# Patient Record
Sex: Female | Born: 1980 | Race: White | Hispanic: No | Marital: Married | Smoking: Former smoker
Health system: Southern US, Community
[De-identification: ages and names within clinical notes are randomized; demographics above are authoritative.]

## PROBLEM LIST (undated history)

## (undated) DIAGNOSIS — L0203 Carbuncle of face: Secondary | ICD-10-CM

## (undated) DIAGNOSIS — J342 Deviated nasal septum: Secondary | ICD-10-CM

## (undated) DIAGNOSIS — J3489 Other specified disorders of nose and nasal sinuses: Secondary | ICD-10-CM

## (undated) DIAGNOSIS — K219 Gastro-esophageal reflux disease without esophagitis: Secondary | ICD-10-CM

## (undated) DIAGNOSIS — J343 Hypertrophy of nasal turbinates: Secondary | ICD-10-CM

## (undated) DIAGNOSIS — F419 Anxiety disorder, unspecified: Secondary | ICD-10-CM

## (undated) HISTORY — PX: WISDOM TOOTH EXTRACTION: SHX21

## (undated) HISTORY — DX: Gastro-esophageal reflux disease without esophagitis: K21.9

---
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
## 2002-03-25 ENCOUNTER — Encounter: Payer: Self-pay | Admitting: Emergency Medicine

## 2002-03-25 ENCOUNTER — Emergency Department (HOSPITAL_COMMUNITY): Admission: EM | Admit: 2002-03-25 | Discharge: 2002-03-25 | Payer: Self-pay | Admitting: Emergency Medicine

## 2005-09-18 ENCOUNTER — Emergency Department (HOSPITAL_COMMUNITY): Admission: EM | Admit: 2005-09-18 | Discharge: 2005-09-18 | Payer: Self-pay | Admitting: Emergency Medicine

## 2005-09-20 ENCOUNTER — Emergency Department (HOSPITAL_COMMUNITY): Admission: EM | Admit: 2005-09-20 | Discharge: 2005-09-20 | Payer: Self-pay | Admitting: Emergency Medicine

## 2007-01-07 ENCOUNTER — Emergency Department (HOSPITAL_COMMUNITY): Admission: EM | Admit: 2007-01-07 | Discharge: 2007-01-07 | Payer: Self-pay | Admitting: Emergency Medicine

## 2007-02-23 ENCOUNTER — Emergency Department (HOSPITAL_COMMUNITY): Admission: EM | Admit: 2007-02-23 | Discharge: 2007-02-23 | Payer: Self-pay | Admitting: Emergency Medicine

## 2007-04-07 ENCOUNTER — Emergency Department (HOSPITAL_COMMUNITY): Admission: EM | Admit: 2007-04-07 | Discharge: 2007-04-07 | Payer: Self-pay | Admitting: Emergency Medicine

## 2007-09-02 ENCOUNTER — Emergency Department (HOSPITAL_COMMUNITY): Admission: EM | Admit: 2007-09-02 | Discharge: 2007-09-02 | Payer: Self-pay | Admitting: Emergency Medicine

## 2009-05-11 ENCOUNTER — Emergency Department (HOSPITAL_BASED_OUTPATIENT_CLINIC_OR_DEPARTMENT_OTHER): Admission: EM | Admit: 2009-05-11 | Discharge: 2009-05-11 | Payer: Self-pay | Admitting: Emergency Medicine

## 2010-01-22 ENCOUNTER — Emergency Department (HOSPITAL_COMMUNITY): Admission: EM | Admit: 2010-01-22 | Discharge: 2010-01-22 | Payer: Self-pay | Admitting: Emergency Medicine

## 2010-06-23 ENCOUNTER — Ambulatory Visit (HOSPITAL_BASED_OUTPATIENT_CLINIC_OR_DEPARTMENT_OTHER): Admission: RE | Admit: 2010-06-23 | Discharge: 2010-06-23 | Payer: Self-pay | Admitting: Plastic Surgery

## 2010-06-23 HISTORY — PX: TRIGGER FINGER RELEASE: SHX641

## 2010-06-23 HISTORY — PX: FINGER TENDON REPAIR: SHX1640

## 2010-09-18 ENCOUNTER — Emergency Department (HOSPITAL_COMMUNITY): Admission: EM | Admit: 2010-09-18 | Discharge: 2010-06-19 | Payer: Self-pay | Admitting: Emergency Medicine

## 2010-12-01 ENCOUNTER — Other Ambulatory Visit: Payer: Self-pay | Admitting: Family Medicine

## 2010-12-01 DIAGNOSIS — N61 Mastitis without abscess: Secondary | ICD-10-CM

## 2010-12-01 DIAGNOSIS — N644 Mastodynia: Secondary | ICD-10-CM

## 2010-12-02 ENCOUNTER — Ambulatory Visit
Admission: RE | Admit: 2010-12-02 | Discharge: 2010-12-02 | Disposition: A | Discharge status: Home / Self Care | Payer: Self-pay | Source: Ambulatory Visit | Attending: Family Medicine | Admitting: Family Medicine

## 2010-12-02 ENCOUNTER — Other Ambulatory Visit: Payer: Self-pay | Admitting: Family Medicine

## 2010-12-02 DIAGNOSIS — N644 Mastodynia: Secondary | ICD-10-CM

## 2010-12-02 DIAGNOSIS — N61 Mastitis without abscess: Secondary | ICD-10-CM

## 2010-12-25 LAB — BASIC METABOLIC PANEL
BUN: 9 mg/dL (ref 6–23)
CO2: 25 mEq/L (ref 19–32)
Calcium: 9.1 mg/dL (ref 8.4–10.5)
Creatinine, Ser: 0.49 mg/dL (ref 0.4–1.2)
Glucose, Bld: 95 mg/dL (ref 70–99)
Sodium: 141 mEq/L (ref 135–145)

## 2010-12-25 LAB — DIFFERENTIAL
Basophils Absolute: 0.1 10*3/uL (ref 0.0–0.1)
Eosinophils Relative: 2 % (ref 0–5)
Lymphocytes Relative: 24 % (ref 12–46)
Lymphs Abs: 1.7 10*3/uL (ref 0.7–4.0)
Monocytes Absolute: 0.5 10*3/uL (ref 0.1–1.0)
Neutro Abs: 4.7 10*3/uL (ref 1.7–7.7)

## 2010-12-25 LAB — POCT HEMOGLOBIN-HEMACUE: Hemoglobin: 13.7 g/dL (ref 12.0–15.0)

## 2010-12-25 LAB — CBC
HCT: 37 % (ref 36.0–46.0)
MCV: 96.6 fL (ref 78.0–100.0)
Platelets: 176 10*3/uL (ref 150–400)
RDW: 13.2 % (ref 11.5–15.5)

## 2010-12-31 LAB — COMPREHENSIVE METABOLIC PANEL
ALT: 13 U/L (ref 0–35)
Albumin: 3.9 g/dL (ref 3.5–5.2)
Alkaline Phosphatase: 45 U/L (ref 39–117)
BUN: 10 mg/dL (ref 6–23)
Chloride: 108 mEq/L (ref 96–112)
Potassium: 3.7 mEq/L (ref 3.5–5.1)
Sodium: 140 mEq/L (ref 135–145)
Total Bilirubin: 0.5 mg/dL (ref 0.3–1.2)
Total Protein: 6.4 g/dL (ref 6.0–8.3)

## 2011-01-18 LAB — DIFFERENTIAL
Basophils Absolute: 0 10*3/uL (ref 0.0–0.1)
Basophils Relative: 0 % (ref 0–1)
Eosinophils Absolute: 0.1 10*3/uL (ref 0.0–0.7)
Monocytes Relative: 5 % (ref 3–12)
Neutro Abs: 8.1 10*3/uL — ABNORMAL HIGH (ref 1.7–7.7)
Neutrophils Relative %: 78 % — ABNORMAL HIGH (ref 43–77)

## 2011-01-18 LAB — BASIC METABOLIC PANEL
BUN: 14 mg/dL (ref 6–23)
CO2: 27 mEq/L (ref 19–32)
Calcium: 9.5 mg/dL (ref 8.4–10.5)
Chloride: 106 mEq/L (ref 96–112)
Creatinine, Ser: 1 mg/dL (ref 0.4–1.2)
Glucose, Bld: 84 mg/dL (ref 70–99)

## 2011-01-18 LAB — CBC
MCHC: 35.3 g/dL (ref 30.0–36.0)
MCV: 98.1 fL (ref 78.0–100.0)
Platelets: 177 10*3/uL (ref 150–400)
RDW: 13.1 % (ref 11.5–15.5)

## 2011-05-12 ENCOUNTER — Emergency Department (HOSPITAL_COMMUNITY)
Admission: EM | Admit: 2011-05-12 | Discharge: 2011-05-12 | Disposition: A | Discharge status: Home / Self Care | Payer: Self-pay | Attending: Emergency Medicine | Admitting: Emergency Medicine

## 2011-05-12 ENCOUNTER — Emergency Department (HOSPITAL_COMMUNITY): Payer: Self-pay

## 2011-05-12 DIAGNOSIS — R112 Nausea with vomiting, unspecified: Secondary | ICD-10-CM | POA: Insufficient documentation

## 2011-05-12 DIAGNOSIS — K2990 Gastroduodenitis, unspecified, without bleeding: Secondary | ICD-10-CM | POA: Insufficient documentation

## 2011-05-12 DIAGNOSIS — K2970 Gastritis, unspecified, without bleeding: Secondary | ICD-10-CM | POA: Insufficient documentation

## 2011-05-12 DIAGNOSIS — F17200 Nicotine dependence, unspecified, uncomplicated: Secondary | ICD-10-CM | POA: Insufficient documentation

## 2011-05-12 DIAGNOSIS — K922 Gastrointestinal hemorrhage, unspecified: Secondary | ICD-10-CM | POA: Insufficient documentation

## 2011-05-12 DIAGNOSIS — R109 Unspecified abdominal pain: Secondary | ICD-10-CM | POA: Insufficient documentation

## 2011-05-12 LAB — COMPREHENSIVE METABOLIC PANEL
Alkaline Phosphatase: 80 U/L (ref 39–117)
BUN: 13 mg/dL (ref 6–23)
Chloride: 104 mEq/L (ref 96–112)
Creatinine, Ser: 0.57 mg/dL (ref 0.50–1.10)
GFR calc Af Amer: >60 mL/min (ref >60)
Glucose, Bld: 98 mg/dL (ref 70–99)
Potassium: 4.2 mEq/L (ref 3.5–5.1)
Total Bilirubin: 0.3 mg/dL (ref 0.3–1.2)
Total Protein: 6.8 g/dL (ref 6.0–8.3)

## 2011-05-12 LAB — CBC
Hemoglobin: 13.3 g/dL (ref 12.0–15.0)
MCH: 32 pg (ref 26.0–34.0)
MCHC: 33.8 g/dL (ref 30.0–36.0)
RDW: 13.6 % (ref 11.5–15.5)

## 2011-05-12 LAB — PROTIME-INR: Prothrombin Time: 12.7 seconds (ref 11.6–15.2)

## 2011-05-12 LAB — SAMPLE TO BLOOD BANK

## 2011-06-08 ENCOUNTER — Ambulatory Visit (INDEPENDENT_AMBULATORY_CARE_PROVIDER_SITE_OTHER): Payer: Self-pay | Admitting: General Surgery

## 2011-06-08 ENCOUNTER — Encounter (INDEPENDENT_AMBULATORY_CARE_PROVIDER_SITE_OTHER): Payer: Self-pay | Admitting: General Surgery

## 2011-06-08 VITALS — BP 106/78 | HR 60 | Temp 99.0°F

## 2011-06-08 DIAGNOSIS — N61 Mastitis without abscess: Secondary | ICD-10-CM

## 2011-06-08 DIAGNOSIS — N611 Abscess of the breast and nipple: Secondary | ICD-10-CM

## 2011-06-08 NOTE — Patient Instructions (Signed)
You are scheduled for incision and drainage of your recurrent left breast breast abscess in the operating room tomorrow. After this infection clears up you may need further surgery for excision of the involved duct.

## 2011-06-08 NOTE — Progress Notes (Signed)
Chief Complaint  Patient presents with  . Other    reck left breast-painful and red     HPI Donna Mcdowell is a 30 y.o. female.    This is a 30 year old Caucasian female who has had left breast abscesses on 3 separate occasions in the past. She was last seen about 6 months ago at which time all of the infection and drainage had resolved. 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  She now presents with 8-10 day history of pain redness and swelling of the left breast. There's been no drainage on the scan or through the nipple. She's been on clindamycin for one week and this has not helped. HPI  Past Medical History  Diagnosis Date  . Breast pain in female     breast abscess  . Fatigue   . Wears glasses     wears contacts     Past Surgical History  Procedure Date  . Hand surgery     History reviewed. No pertinent family history.  Social History History  Substance Use Topics  . Smoking status: Current Everyday Smoker -- 1.0 packs/day  . Smokeless tobacco: Not on file  . Alcohol Use: Yes    No Known Allergies  No current outpatient prescriptions on file.    Review of Systems ROS 12 system review of systems is performed and is negative except as described above. Blood pressure 106/78, pulse 60, temperature 99 F (37.2 C).  Physical Exam Physical Exam  Constitutional: She appears well-developed and well-nourished. No distress.  HENT:  Head: Normocephalic and atraumatic.  Neck: No tracheal deviation present. No thyromegaly present.  Cardiovascular: Normal rate, normal heart sounds and intact distal pulses.   No murmur heard. Respiratory: Effort normal and breath sounds normal. No respiratory distress. She has no wheezes. She has no rales. She exhibits no tenderness.       Data Reviewed None other than the old chart  Assessment    Recurrent left breast abscess. I suspect that this is some  form of subareolar sepsis from the duct ectasia and perioductal mastitis complex.    Plan    In the short term, she will be taken to the operating room tomorrow for incision and drainage of the abscesses.  Long-term, once the inflammation settles down, she may need a formal duct excision to resolve these recurrent abscesses.       Opie Fanton M 06/08/2011, 4:26 PM

## 2011-06-09 ENCOUNTER — Ambulatory Visit (HOSPITAL_BASED_OUTPATIENT_CLINIC_OR_DEPARTMENT_OTHER)
Admission: RE | Admit: 2011-06-09 | Discharge: 2011-06-09 | Disposition: A | Discharge status: Home / Self Care | Payer: Self-pay | Source: Ambulatory Visit | Attending: General Surgery | Admitting: General Surgery

## 2011-06-09 DIAGNOSIS — F17200 Nicotine dependence, unspecified, uncomplicated: Secondary | ICD-10-CM | POA: Insufficient documentation

## 2011-06-09 DIAGNOSIS — N61 Mastitis without abscess: Secondary | ICD-10-CM

## 2011-06-09 DIAGNOSIS — K219 Gastro-esophageal reflux disease without esophagitis: Secondary | ICD-10-CM | POA: Insufficient documentation

## 2011-06-09 DIAGNOSIS — Z01812 Encounter for preprocedural laboratory examination: Secondary | ICD-10-CM | POA: Insufficient documentation

## 2011-06-09 HISTORY — PX: INCISION AND DRAINAGE BREAST ABSCESS: SUR672

## 2011-06-09 LAB — GRAM STAIN

## 2011-06-10 ENCOUNTER — Telehealth (INDEPENDENT_AMBULATORY_CARE_PROVIDER_SITE_OTHER): Payer: Self-pay

## 2011-06-10 NOTE — Telephone Encounter (Signed)
LMOM for pt to call for po appt.

## 2011-06-13 LAB — CULTURE, ROUTINE-ABSCESS

## 2011-06-14 LAB — ANAEROBIC CULTURE

## 2011-06-14 NOTE — Op Note (Signed)
NAMEJULIETA, Donna Mcdowell             ACCOUNT NO.:  [id number]  MEDICAL RECORD NO.:  [id number]  LOCATION:                                 FACILITY:  PHYSICIAN:  Angelia Mould. Derrell Lolling, M.D.DATE OF BIRTH:  [birth year]  DATE OF PROCEDURE:  06/09/2011 DATE OF DISCHARGE:                              OPERATIVE REPORT   PREOPERATIVE DIAGNOSIS:  Recurrent left breast abscess.  POSTOPERATIVE DIAGNOSIS:  Recurrent left breast abscess.  OPERATIONS PERFORMED: 1. Focused ultrasound left breast for guided-needle aspiration of left     breast abscess. 2. Incision and drainage of left breast abscess.  SURGEON:  Angelia Mould. Derrell Lolling, MD  OPERATIVE INDICATIONS:  This is a 30 year old Caucasian female who was evaluated in the office yesterday with a left breast abscess, which appeared to be behind the superior rim of the areolar margin.  There was no skin necrosis, no active drainage, and no nipple discharge.  She has a history of two or three other abscesses in the past.  She has an old incision at the 12 o'clock position in the circumareolar location.  She has an old incision at the 6 o'clock position and in the circumareolar orientation.  She also has scars medial and lateral to the nipple where she has had piercings in the past.  She has not had any jewelry or piercing recently.  She has never developed a fistula or any chronic drainage.  She is brought to the operating room for ultrasound mapping of the abscess and drainage under general anesthesia.  It should be noted that she has been on clindamycin for 1 week as an outpatient and this has not improved the infection.  OPERATIVE FINDINGS:  She had a ballotable fluctuant abscess behind the areolar margin at the 12 o'clock position.  There was erythema both superiorly and medially.  There was no drainage from the nipple.  The ultrasound showed a small abscess consistent with physical findings at the 12 o'clock position extending under the areolar  margin and extending out a little bit.  There was otherwise just edema in the tissues and no mass effect.  OPERATIVE TECHNIQUE:  Following the induction of general endotracheal anesthesia, the patient's left breast was prepped and draped in a sterile fashion.  Surgical time-out was held identifying correct patient and correct procedure and correct site.  We brought the ultrasound onto the operative field.  I mapped out the abscess and going circumferentially around the periareolar area and we could see the fluid-filled density extending from about the 11 o'clock to 1 o'clock position.  I then took an 18-gauge needle and syringe and aspirated some tan light creamy pus through the areolar margin.  This was sent for aerobic and anaerobic cultures and a stat Gram stain.  I then made an incision at the superior areolar margin going through the old scar.  I drained the abscess.  I digitally explored this with my finger and made sure that it was completely evacuated.  The wound was irrigated with saline.  Hemostasis was good and achieved with electrocautery.  I placed a one-quarter inch Penrose drain down in the wound and then placed a single suture of 4-0 nylon and the skin  to reapproximate the margins of the areola in the middle of this and so the drain was coming out both medial and lateral to the suture.  Clean absorbent bandages were placed and the patient was taken to the recovery room in stable condition.  Estimated blood loss was about 10 mL.  Complications none.  Sponge, needles and instrument counts were correct.     Angelia Mould. Derrell Lolling, M.D.     HMI/MEDQ  D:  06/09/2011  T:  06/09/2011  Job:  [id number]  Electronically Signed by Claud Kelp M.D. on 06/14/2011 11:33:18 AM

## 2011-06-18 ENCOUNTER — Ambulatory Visit (INDEPENDENT_AMBULATORY_CARE_PROVIDER_SITE_OTHER): Payer: Self-pay | Admitting: General Surgery

## 2011-06-18 ENCOUNTER — Encounter (INDEPENDENT_AMBULATORY_CARE_PROVIDER_SITE_OTHER): Payer: Self-pay | Admitting: General Surgery

## 2011-06-18 VITALS — BP 102/72 | HR 70 | Temp 98.7°F

## 2011-06-18 DIAGNOSIS — Z9889 Other specified postprocedural states: Secondary | ICD-10-CM

## 2011-06-18 NOTE — Progress Notes (Signed)
Subjective:     Patient ID: Donna Mcdowell, female   DOB: 02-06-1981, 30 y.o.   MRN: [id number]  HPI This patient has developed recurrent periareolar infections. She recently underwent incision and drainage of a periareolar abscess at the 12:00 position. She says that she is feeling much better with minimal pain. 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 She is taking Augmentin and is not having any side effects from that. The culture report shows polymicrobial bacteria including gram-positive cocci in pairs, a few gram-negative rods, and rare gram-positive rods. There were no anaerobes.  Review of Systems     Objective:   Physical Exam The left breast is examined. A small open wound superiorly has a little bit of turbid drainage but all the cellulitis and tenderness have resolved. The Penrose drain fell out at home. There is no residual drain in the wound. The single suture is removed. Redressed with dry gauze.    Assessment:     Peri-areolar abscess left breast, recurrent, recovering uneventfully following incision and drainage.  I am not entirely sure of the etiology of this. She has never developed a chronic fistula suggesting chronic cystic mastitis. This may be related to trauma from her nipple piercings in the past, although she has not done that recently.    Plan:     Continue antibiotics until they are gone.  Shower twice daily and cover with dry gauze. Ointments are discouraged.  Return to see me in 2 months.

## 2011-06-18 NOTE — Patient Instructions (Signed)
The infection in your left breast appears to be resolving nicely. Continue the antibiotics until they are completely gone. Shower and cover the wound with dry gauze bandage twice a day. Did not use any ointments. Return to see me in 2 months so I can be sure that everything healed properly.

## 2011-10-07 ENCOUNTER — Emergency Department (HOSPITAL_COMMUNITY)
Admission: EM | Admit: 2011-10-07 | Discharge: 2011-10-07 | Disposition: A | Discharge status: Home / Self Care | Payer: Self-pay | Attending: Emergency Medicine | Admitting: Emergency Medicine

## 2011-10-07 ENCOUNTER — Encounter (HOSPITAL_COMMUNITY): Payer: Self-pay | Admitting: Emergency Medicine

## 2011-10-07 DIAGNOSIS — L0201 Cutaneous abscess of face: Secondary | ICD-10-CM | POA: Insufficient documentation

## 2011-10-07 DIAGNOSIS — L03211 Cellulitis of face: Secondary | ICD-10-CM | POA: Insufficient documentation

## 2011-10-07 DIAGNOSIS — F17200 Nicotine dependence, unspecified, uncomplicated: Secondary | ICD-10-CM | POA: Insufficient documentation

## 2011-10-07 DIAGNOSIS — L0291 Cutaneous abscess, unspecified: Secondary | ICD-10-CM

## 2011-10-07 MED ORDER — DOXYCYCLINE HYCLATE 100 MG PO CAPS: 100.0000 mg | ORAL_CAPSULE | Freq: Two times a day (BID) | ORAL | Status: AC

## 2011-10-07 MED ORDER — LIDOCAINE HCL 1 % IJ SOLN
2.0000 mL | Freq: Once | INTRAMUSCULAR | Status: AC
  Administered 2011-10-07: 10 mL

## 2011-10-07 MED ORDER — LIDOCAINE HCL 1 % IJ SOLN
INTRAMUSCULAR | Status: AC
  Administered 2011-10-07: 11:00:00
  Filled 2011-10-07: qty 20

## 2011-10-07 MED ORDER — HYDROCODONE-ACETAMINOPHEN 5-325 MG PO TABS
2.0000 | ORAL_TABLET | Freq: Once | ORAL | Status: DC
  Filled 2011-10-07: qty 2

## 2011-10-07 MED ORDER — LIDOCAINE-EPINEPHRINE (PF) 2 %-1:200000 IJ SOLN: 10.0000 mL | Freq: Once | INTRAMUSCULAR | Status: DC

## 2011-10-07 MED ORDER — HYDROCODONE-ACETAMINOPHEN 7.5-500 MG/15ML PO SOLN: 10.0000 mL | Freq: Once | ORAL | Status: DC

## 2011-10-07 NOTE — ED Provider Notes (Signed)
Medical screening examination/treatment/procedure(s) were performed by non-physician practitioner and as supervising physician I was immediately available for consultation/collaboration.  Kaynan Klonowski P Adam Sanjuan, MD 10/07/11 1609 

## 2011-10-07 NOTE — ED Provider Notes (Signed)
History     CSN: [id number]  Arrival date & time 10/07/11  4098   First MD Initiated Contact with Patient 10/07/11 [phone]      Chief Complaint  Patient presents with  . Facial Swelling    Red, swollen "pimple on l/side of face-in front of l/ear. Pain x 2 days    (Consider location/radiation/quality/duration/timing/severity/associated sxs/prior treatment) The history is provided by the patient.    Pt presents to the ED with complaints of abscess to left side of face. The abscess started 2 days ago. Patient has a history of abscess in which she needed surgery for, she therefore came in early this time. The patient has used warm compresses, had taken ibuprofen and tylenol for pain but it has not helped. Pt denies fevers, chills, vomiting, diarrhea, nausea.  Past Medical History  Diagnosis Date  . Breast pain in female     breast abscess  . Fatigue   . Wears glasses     wears contacts     Past Surgical History  Procedure Date  . Hand surgery     Family History  Problem Relation Age of Onset  . Cancer Mother     History  Substance Use Topics  . Smoking status: Current Everyday Smoker -- 1.0 packs/day  . Smokeless tobacco: Not on file  . Alcohol Use: Yes    OB History    Grav Para Term Preterm Abortions TAB SAB Ect Mult Living                  Review of Systems  All other systems reviewed and are negative.    Allergies  Oxycodone base  Home Medications   Current Outpatient Rx  Name Route Sig Dispense Refill  . ACETAMINOPHEN 500 MG PO TABS Oral Take 1,000 mg by mouth every 6 (six) hours as needed. pain     . GOODY HEADACHE PO Oral Take 1 packet by mouth every 6 (six) hours as needed. pain     . DOXYCYCLINE HYCLATE 100 MG PO CAPS Oral Take 1 capsule (100 mg total) by mouth 2 (two) times daily. 20 capsule 1    BP 115/71  Pulse 82  Temp(Src) 97.7 F (36.5 C) (Oral)  Resp 16  SpO2 98%  LMP 09/16/2011  Physical Exam  Constitutional: She appears  well-developed and well-nourished.  HENT:  Head: Normocephalic and atraumatic.    Eyes: Pupils are equal, round, and reactive to light.  Neck: Trachea normal, normal range of motion and full passive range of motion without pain. Neck supple.  Cardiovascular: Normal rate, regular rhythm and normal pulses.   Pulmonary/Chest: Effort normal and breath sounds normal. Chest wall is not dull to percussion. She exhibits no tenderness, no crepitus, no edema, no deformity and no retraction.  Abdominal: Soft. Normal appearance and bowel sounds are normal.  Musculoskeletal: Normal range of motion.  Neurological: She is alert. She has normal strength.  Skin: Skin is warm, dry and intact.  Psychiatric: Her speech is normal. Cognition and memory are normal.    ED Course  Procedures (including critical care time)  Labs Reviewed - No data to display No results found.   1. Abscess       MDM  INCISION AND DRAINAGE Performed by: Dorthula Matas Consent: Verbal consent obtained. Risks and benefits: risks, benefits and alternatives were discussed Type: abscess  Body area: left periauricular  Anesthesia: local infiltration  Local anesthetic: lidocaine 1% without epinephrine  Anesthetic total: 2 ml  Complexity:  complex Blunt dissection to break up loculations  Drainage: purulent  Drainage amount: copious    Packing material: to small to pack  Patient tolerance: Patient tolerated the procedure well with no immediate complications.           Dorthula Matas, PA 10/07/11 [phone]

## 2012-03-16 ENCOUNTER — Telehealth (INDEPENDENT_AMBULATORY_CARE_PROVIDER_SITE_OTHER): Payer: Self-pay | Admitting: General Surgery

## 2012-03-16 NOTE — Telephone Encounter (Signed)
Pt called in stating she noticed when she got of out the shower the incision site on her breast had a yellow/green drainage. Last seen by Dr. Derrell Lolling 06/18/11. Patient has history of I&D for breast abscess. Pt stated no pain and the area is not red, site about the size of the top of a pen. Due to her history the patient was added to urgent office 03/17/12.

## 2012-03-17 ENCOUNTER — Ambulatory Visit (INDEPENDENT_AMBULATORY_CARE_PROVIDER_SITE_OTHER): Payer: Self-pay | Admitting: Surgery

## 2012-03-17 ENCOUNTER — Encounter (INDEPENDENT_AMBULATORY_CARE_PROVIDER_SITE_OTHER): Payer: Self-pay | Admitting: Surgery

## 2012-03-17 VITALS — BP 116/74 | HR 70 | Temp 97.8°F | Resp 14 | Ht 64.0 in | Wt 129.0 lb

## 2012-03-17 DIAGNOSIS — N61 Mastitis without abscess: Secondary | ICD-10-CM

## 2012-03-17 NOTE — Progress Notes (Signed)
[facility]  Ovidio Kin, MD,  FACS [address]  [address] [place], [place]    [address] Phone:  [phone] FAX:  [phone]   Re:   Donna Mcdowell DOB:   10/03/1981 MRN:   [id number]  URGENT OFFICE  ASSESSMENT AND PLAN: 1.  Recurrent left breast drainage  Photo taken.  This draining sinus will probably have to be excised.  Referred for follow up with Dr. Derrell Lolling.  I did not charge her for today's visit.  2.  Smokes 1 ppd - she knows it is bad for her health.  HISTORY OF PRESENT ILLNESS: Chief Complaint  Patient presents with  . Post-op Problem    Discharge from incision site - breast   Donna Mcdowell is a 31 y.o. (DOB: [birth year])  white female who is a patient of No primary provider on file. and comes to me today for left breast drainage.  She patient sounds like she had an infection of the left breast at a nipple ring site.  Dr. Derrell Lolling tried to manage this in the office, but the infection/drainage recurred.  He then took her to the OR 06/09/2011 and did an I&D.  This healed and was doing well until last week when she noticed drainage from the incision above the left nipple.   She has had no redness or evidence of recurrent infection, just stuff draining out of the wound.  PHYSICAL EXAM: BP 116/74  Pulse 70  Temp(Src) 97.8 F (36.6 C) (Temporal)  Resp 14  Ht 5\' 4"  (1.626 m)  Wt 129 lb (58.514 kg)  BMI 22.14 kg/m2  HEENT:  Pupils equal.  Otherwise unremarkable. NECK:  Supple.  No thyroid mass. LYMPH NODES:  No cervical, supraclavicular, or axillary adenopathy. BREASTS -  RIGHT:  No palpable mass or nodule.  No nipple discharge.   LEFT:  Above the nipple there is a 2 cm incision and I can express white sebaceum from the middle of the incision.  There is no infection nor anything to drain acutely. UPPER EXTREMITIES:  Multiple tattoos.  DATA REVIEWED: Dr. Jacinto Halim old notes.  Ovidio Kin, MD, FACS Office:  [phone]

## 2012-03-22 ENCOUNTER — Telehealth (INDEPENDENT_AMBULATORY_CARE_PROVIDER_SITE_OTHER): Payer: Self-pay

## 2012-03-22 NOTE — Telephone Encounter (Signed)
LMOM for pt that appt 04-05-12 at 1:30.

## 2012-04-05 ENCOUNTER — Ambulatory Visit (INDEPENDENT_AMBULATORY_CARE_PROVIDER_SITE_OTHER): Payer: Self-pay | Admitting: General Surgery

## 2012-04-05 ENCOUNTER — Telehealth (INDEPENDENT_AMBULATORY_CARE_PROVIDER_SITE_OTHER): Payer: Self-pay | Admitting: General Surgery

## 2012-04-05 ENCOUNTER — Encounter (INDEPENDENT_AMBULATORY_CARE_PROVIDER_SITE_OTHER): Payer: Self-pay | Admitting: General Surgery

## 2012-04-05 DIAGNOSIS — N61 Mastitis without abscess: Secondary | ICD-10-CM

## 2012-04-05 NOTE — Progress Notes (Signed)
Patient ID: Donna Mcdowell, female   DOB: 1981/03/13, 31 y.o.   MRN: [id number]   Patient failed to show up for her followup visit for her breast problems. Our nursing staff is instructed to contact her to arrange followup, if desired.   Angelia Mould. Derrell Lolling, M.D., [facility] General and Minimally invasive Surgery Breast and Colorectal Surgery Office:   [phone] Pager:   [phone]

## 2012-04-05 NOTE — Patient Instructions (Signed)
This patient did not show up for her office visit today. She'll be contacted to arrange a followup visit.

## 2012-04-05 NOTE — Telephone Encounter (Signed)
Called patient to determine cause for missing her appointment with [place] today. Patient stated that she thought the appointment was next week. Patient advised that she is doing better and the area does not have any signs of infection, no redness or discharge. Information related to Dr. Derrell Lolling when call was placed on hold and he advised that she contact our office in 3 weeks to set up an appointment for follow up. Patient agreed.

## 2012-04-13 ENCOUNTER — Encounter (INDEPENDENT_AMBULATORY_CARE_PROVIDER_SITE_OTHER): Payer: Self-pay | Admitting: General Surgery

## 2012-12-07 ENCOUNTER — Ambulatory Visit (INDEPENDENT_AMBULATORY_CARE_PROVIDER_SITE_OTHER): Payer: BC Managed Care – PPO | Admitting: Family Medicine

## 2012-12-07 VITALS — BP 112/69 | HR 82 | Temp 97.9°F | Resp 16 | Ht 64.0 in | Wt 135.0 lb

## 2012-12-07 DIAGNOSIS — R05 Cough: Secondary | ICD-10-CM

## 2012-12-07 DIAGNOSIS — J029 Acute pharyngitis, unspecified: Secondary | ICD-10-CM

## 2012-12-07 LAB — POCT RAPID STREP A (OFFICE): Rapid Strep A Screen: NEGATIVE

## 2012-12-07 MED ORDER — AZITHROMYCIN 250 MG PO TABS: ORAL_TABLET | ORAL | Status: DC

## 2012-12-07 NOTE — Progress Notes (Signed)
[facility] [address] [place] [address] [phone]  Date:  12/07/2012   Name:  Donna Mcdowell   DOB:  December 16, 1980   MRN:  [id number]  PCP:  No primary provider on file.    Chief Complaint: Cough and Sore Throat   History of Present Illness:  Donna Mcdowell is a 32 y.o. very pleasant female patient who presents with the following:  Donna Mcdowell has been ill since yesterday.  She notes a ST- it was worse this morning but has now eased off some.  She also noted a mild cough and chest congestion. She feels as though she may have bronchitis No fever that she had noted, no aches or chills No GI symptoms.  She does have a stuffy nose but she has had some allergies No sick contacts.   She is a smoker LMP was just about one month ago- she states there is no chance of pregnancy     Patient Active Problem List  Diagnosis  . Breast infection, left    Past Medical History  Diagnosis Date  . Breast pain in female     breast abscess  . Fatigue   . Wears glasses     wears contacts     Past Surgical History  Procedure Laterality Date  . Hand surgery      History  Substance Use Topics  . Smoking status: Current Every Day Smoker -- 1.00 packs/day  . Smokeless tobacco: Not on file  . Alcohol Use: Yes    Family History  Problem Relation Age of Onset  . Cancer Mother     Allergies  Allergen Reactions  . Oxycodone Base Nausea And Vomiting    Medication list has been reviewed and updated.  Current Outpatient Prescriptions on File Prior to Visit  Medication Sig Dispense Refill  . acetaminophen (TYLENOL) 500 MG tablet Take 1,000 mg by mouth every 6 (six) hours as needed. pain       . Aspirin-Acetaminophen-Caffeine (GOODY HEADACHE PO) Take 1 packet by mouth every 6 (six) hours as needed. pain        No current facility-administered medications on file prior to visit.    Review of Systems:  As per HPI- otherwise negative.   Physical  Examination: Filed Vitals:   12/07/12 1254  BP: 112/69  Pulse: 82  Temp: 97.9 F (36.6 C)  Resp: 16   Filed Vitals:   12/07/12 1254  Height: 5\' 4"  (1.626 m)  Weight: 135 lb (61.236 kg)   Body mass index is 23.16 kg/(m^2). Ideal Body Weight: Weight in (lb) to have BMI = 25: 145.3  GEN: WDWN, NAD, Non-toxic, A & O x 3 HEENT: Atraumatic, Normocephalic. Neck supple. No masses, No LAD. Bilateral TM wnl, oropharynx normal.  PEERL,EOMI.   Ears and Nose: No external deformity. CV: RRR, No M/G/R. No JVD. No thrill. No extra heart sounds. PULM: CTA B, no wheezes, crackles, rhonchi. No retractions. No resp. distress. No accessory muscle use. ABD: S, NT, ND EXTR: No c/c/e NEURO Normal gait.  PSYCH: Normally interactive. Conversant. Not depressed or anxious appearing.  Calm demeanor.   Results for orders placed in visit on 12/07/12  POCT RAPID STREP A (OFFICE)      Result Value Range   Rapid Strep A Screen Negative  Negative     Assessment and Plan: Acute pharyngitis - Plan: POCT rapid strep A  Cough - Plan: azithromycin (ZITHROMAX) 250 MG tablet  Suspect that [facility]  has a viral illness.  She would like an antibiotic, but I encouraged her to hold her azithromycin for the next few days to see if she will improve on her own.  If she does not get better she may fill the azithromycin- let me know sooner if worse  Abbe Amsterdam, MD

## 2012-12-07 NOTE — Patient Instructions (Addendum)
You most likely have a viral infection- try rest, fluids, and OTC pain/ fever reducers as needed.  If you are not better in the next few days fill the antibiotic rx.  Let me know if you are not better in the next 4 or 5 days, sooner if you get worse

## 2012-12-16 ENCOUNTER — Emergency Department (HOSPITAL_COMMUNITY)
Admission: EM | Admit: 2012-12-16 | Discharge: 2012-12-16 | Disposition: A | Discharge status: Home / Self Care | Payer: BC Managed Care – PPO | Attending: Emergency Medicine | Admitting: Emergency Medicine

## 2012-12-16 ENCOUNTER — Emergency Department (HOSPITAL_COMMUNITY): Payer: BC Managed Care – PPO

## 2012-12-16 DIAGNOSIS — R110 Nausea: Secondary | ICD-10-CM | POA: Insufficient documentation

## 2012-12-16 DIAGNOSIS — Z8619 Personal history of other infectious and parasitic diseases: Secondary | ICD-10-CM | POA: Insufficient documentation

## 2012-12-16 DIAGNOSIS — F17200 Nicotine dependence, unspecified, uncomplicated: Secondary | ICD-10-CM | POA: Insufficient documentation

## 2012-12-16 DIAGNOSIS — R1013 Epigastric pain: Secondary | ICD-10-CM | POA: Insufficient documentation

## 2012-12-16 DIAGNOSIS — Z3202 Encounter for pregnancy test, result negative: Secondary | ICD-10-CM | POA: Insufficient documentation

## 2012-12-16 DIAGNOSIS — Z87898 Personal history of other specified conditions: Secondary | ICD-10-CM | POA: Insufficient documentation

## 2012-12-16 DIAGNOSIS — R109 Unspecified abdominal pain: Secondary | ICD-10-CM

## 2012-12-16 LAB — CBC WITH DIFFERENTIAL/PLATELET
Basophils Absolute: 0 10*3/uL (ref 0.0–0.1)
Basophils Relative: 0 % (ref 0–1)
Eosinophils Relative: 3 % (ref 0–5)
HCT: 38.2 % (ref 36.0–46.0)
MCHC: 34.6 g/dL (ref 30.0–36.0)
MCV: 93.4 fL (ref 78.0–100.0)
Monocytes Absolute: 0.4 10*3/uL (ref 0.1–1.0)
Neutro Abs: 5 10*3/uL (ref 1.7–7.7)
RDW: 12.4 % (ref 11.5–15.5)

## 2012-12-16 LAB — URINALYSIS, ROUTINE W REFLEX MICROSCOPIC
Glucose, UA: NEGATIVE mg/dL
Leukocytes, UA: NEGATIVE
pH: 6.5 (ref 5.0–8.0)

## 2012-12-16 LAB — PREGNANCY, URINE: Preg Test, Ur: NEGATIVE

## 2012-12-16 LAB — COMPREHENSIVE METABOLIC PANEL
AST: 15 U/L (ref 0–37)
Albumin: 4.2 g/dL (ref 3.5–5.2)
Calcium: 9.2 mg/dL (ref 8.4–10.5)
Creatinine, Ser: 0.6 mg/dL (ref 0.50–1.10)
Total Protein: 7 g/dL (ref 6.0–8.3)

## 2012-12-16 MED ORDER — IOHEXOL 300 MG/ML  SOLN
50.0000 mL | Freq: Once | INTRAMUSCULAR | Status: AC | PRN
  Administered 2012-12-16: 50 mL via ORAL

## 2012-12-16 MED ORDER — HYDROMORPHONE HCL PF 1 MG/ML IJ SOLN
1.0000 mg | Freq: Once | INTRAMUSCULAR | Status: AC
  Administered 2012-12-16: 1 mg via INTRAVENOUS
  Filled 2012-12-16: qty 1

## 2012-12-16 MED ORDER — ONDANSETRON HCL 4 MG/2ML IJ SOLN
4.0000 mg | Freq: Once | INTRAMUSCULAR | Status: AC
  Administered 2012-12-16: 4 mg via INTRAVENOUS
  Filled 2012-12-16: qty 2

## 2012-12-16 MED ORDER — IOHEXOL 300 MG/ML  SOLN
100.0000 mL | Freq: Once | INTRAMUSCULAR | Status: AC | PRN
  Administered 2012-12-16: 100 mL via INTRAVENOUS

## 2012-12-16 MED ORDER — SODIUM CHLORIDE 0.9 % IV BOLUS (SEPSIS)
1000.0000 mL | Freq: Once | INTRAVENOUS | Status: AC
  Administered 2012-12-16: 1000 mL via INTRAVENOUS

## 2012-12-16 NOTE — ED Notes (Signed)
Pt c/o abdominal pain around mid abdomin. Pt states entire abdominal area is tender to touch and worse with movement. Pt describes pain as sharp and constant. Pt denies n/v/d. Pt states pain started about one hour ago.

## 2012-12-16 NOTE — ED Provider Notes (Signed)
History     CSN: [id number]  Arrival date & time 12/16/12  1641   First MD Initiated Contact with Patient 12/16/12 1705      Chief Complaint  Patient presents with  . Abdominal Pain    (Consider location/radiation/quality/duration/timing/severity/associated sxs/prior treatment) HPI  32 year old female with epigastric pain began approximately one hour prior to admission. She describes as pressure radiating out bilaterally and throughout her abdomen. She has had nausea but no vomiting. This began proximally 45 minutes after eating lunch. She has no history of a similar event in the past. She was well last night and this morning. She has not had fever or chills. Pain has been present for one hour and is moderate to severe in nature. She has had some associated nausea. She had not taken any medication prior to evaluation here.  Past Medical History  Diagnosis Date  . Breast pain in female     breast abscess  . Fatigue   . Wears glasses     wears contacts     Past Surgical History  Procedure Laterality Date  . Hand surgery      Family History  Problem Relation Age of Onset  . Cancer Mother     History  Substance Use Topics  . Smoking status: Current Every Day Smoker -- 1.00 packs/day  . Smokeless tobacco: Not on file  . Alcohol Use: Yes    OB History   Grav Para Term Preterm Abortions TAB SAB Ect Mult Living                  Review of Systems  Genitourinary: Negative for vaginal bleeding, vaginal discharge, genital sores, menstrual problem and pelvic pain.  All other systems reviewed and are negative.    Allergies  Oxycodone base  Home Medications   Current Outpatient Rx  Name  Route  Sig  Dispense  Refill  . acetaminophen (TYLENOL) 500 MG tablet   Oral   Take 1,000 mg by mouth every 6 (six) hours as needed. pain          . ibuprofen (ADVIL,MOTRIN) 200 MG tablet   Oral   Take 200 mg by mouth every 6 (six) hours as needed for pain. Pain            BP 119/67  Pulse 84  Temp(Src) 97.9 F (36.6 C) (Oral)  Resp 16  SpO2 96%  LMP 11/18/2012  Physical Exam  Nursing note and vitals reviewed. Constitutional: She appears well-developed and well-nourished.  HENT:  Head: Normocephalic and atraumatic.  Eyes: Conjunctivae and EOM are normal. Pupils are equal, round, and reactive to light.  Neck: Normal range of motion. Neck supple.  Cardiovascular: Normal rate, regular rhythm, normal heart sounds and intact distal pulses.   Pulmonary/Chest: Effort normal and breath sounds normal.  Abdominal: Soft. Bowel sounds are normal. There is tenderness.  Diffuse abdominal tenderness more tender in epigastrium and left upper quadrant. Mild lower abdominal tenderness.  Musculoskeletal: Normal range of motion.  Neurological: She is alert.  Skin: Skin is warm and dry.  Psychiatric: She has a normal mood and affect. Thought content normal.    ED Course  Procedures (including critical care time)  Labs Reviewed  CBC WITH DIFFERENTIAL  COMPREHENSIVE METABOLIC PANEL  LIPASE, BLOOD  URINALYSIS, ROUTINE W REFLEX MICROSCOPIC  PREGNANCY, URINE   US Abdomen Complete  12/16/2012  *RADIOLOGY REPORT*  Clinical Data:  Mid abdominal pain  COMPLETE ABDOMINAL ULTRASOUND  Comparison:  None.  Findings:  Gallbladder:  No gallstones, gallbladder wall thickening, or pericholecystic fluid.  Negative sonographic Murphy's sign.  Common bile duct:  Measures 4 mm.  Liver:  No focal lesion identified.  Within normal limits in parenchymal echogenicity.  IVC:  Appears normal.  Pancreas:  No focal abnormality seen.  Spleen:  Measures 8.2 cm.  Right Kidney:  Measures 11.4 cm.  No mass or hydronephrosis.  Left Kidney:  Measures 10.6 cm.  No mass or hydronephrosis.  Abdominal aorta:  No aneurysm identified.  IMPRESSION: Negative abdominal ultrasound.   Original Report Authenticated By: Charline Bills, M.D.    Ct Abdomen Pelvis W Contrast  12/16/2012  *RADIOLOGY REPORT*   Clinical Data: Upper abdominal pain.  Symptoms began 1 hour ago.  CT ABDOMEN AND PELVIS WITH CONTRAST  Technique:  Multidetector CT imaging of the abdomen and pelvis was performed following the standard protocol during bolus administration of intravenous contrast.  Contrast: 50mL OMNIPAQUE IOHEXOL 300 MG/ML  SOLN, OMNIPAQUE IOHEXOL 300 MG/ML  SOLN  Comparison: Ultrasound earlier in the day.  CT of 01/07/2007  Findings: Lung bases:  Normal  Abdomen/pelvis:  Normal liver, spleen, stomach, pancreas, gallbladder, biliary tract, adrenal glands, kidneys.  No retroperitoneal or retrocrural adenopathy.  Normal colon, appendix, and terminal ileum.  Normal small bowel without abdominal ascites.  No pelvic adenopathy.  Normal urinary bladder and uterus.  A left ovarian lesion has peripheral enhancement and measures 1.7 cm on image 65/series 2. No significant free fluid.  Bones/Musculoskeletal:  No acute osseous abnormality.  IMPRESSION:  1.  Left ovarian corpus luteal cyst. 2.  Otherwise, normal abdominal pelvic CT, without explanation for pain.   Original Report Authenticated By: Jeronimo Greaves, M.D.      No diagnosis found.    MDM   Labs, abdominal ultrasound, CT abdomen pelvis are normal. Patient feels much better after IV fluids and 1 mg Dilaudid. She has taken by mouth without vomiting. I discussed the results of the above tests with her. I have given her precautions regarding abdominal pain.      Hilario Quarry, MD 12/16/12 [phone]

## 2013-01-03 ENCOUNTER — Encounter: Payer: Self-pay | Admitting: Gynecology

## 2013-01-03 ENCOUNTER — Ambulatory Visit (INDEPENDENT_AMBULATORY_CARE_PROVIDER_SITE_OTHER): Payer: BC Managed Care – PPO | Admitting: Gynecology

## 2013-01-03 VITALS — BP 110/74 | HR 62 | Ht 64.0 in | Wt 134.0 lb

## 2013-01-03 DIAGNOSIS — Z01419 Encounter for gynecological examination (general) (routine) without abnormal findings: Secondary | ICD-10-CM | POA: Insufficient documentation

## 2013-01-03 DIAGNOSIS — Z0000 Encounter for general adult medical examination without abnormal findings: Secondary | ICD-10-CM

## 2013-01-03 LAB — POCT URINALYSIS DIPSTICK
Blood, UA: NEGATIVE
Glucose, UA: NEGATIVE
Nitrite, UA: NEGATIVE
Protein, UA: NEGATIVE
Urobilinogen, UA: NEGATIVE

## 2013-01-03 NOTE — Patient Instructions (Signed)

## 2013-01-03 NOTE — Progress Notes (Signed)
32 y.o. Single Caucasian female   G0P0 here for annual exam. Pt reports being seen in ER for abdominal pain, evaluation normal felt to be related to recent passing of father.  Pt lesbian, current partner 3 1/2 years.  Denies dysparunia, denies vaginal penetration.    Patient's last menstrual period was 12/05/2012.          Sexually active: yes  The current method of family planning is none.    Exercising: no Last mammogram: left 2012  Last pap smear: unknown History of abnormal pap: no Smoking: yes 1ppd 10y Alcohol:yes wine or beer Last colonoscopy: no  Last Bone Density:  no Last tetanus shot:<5y Last cholesterol check: unknown BSE: yes  Hgb:                Urine:    Health Maintenance  Topic Date Due  . Influenza Vaccine  06/13/1983  . Pap Smear  12/19/2000  . Tetanus/tdap  12/19/2001    Family History  Problem Relation Age of Onset  . Breast cancer Mother 31  . Osteoarthritis Maternal Grandmother   . Osteoarthritis Maternal Grandfather   . Heart failure Maternal Grandfather     Patient Active Problem List  Diagnosis  . Breast infection, left    Past Medical History  Diagnosis Date  . Breast pain in female     breast abscess  . Fatigue   . Wears glasses     wears contacts     Past Surgical History  Procedure Laterality Date  . Hand surgery    . Breast surgery Left     breast abscess, history of peircing on that breast    Allergies: Oxycodone base  Current Outpatient Prescriptions  Medication Sig Dispense Refill  . acetaminophen (TYLENOL) 500 MG tablet Take 1,000 mg by mouth every 6 (six) hours as needed. pain       . ibuprofen (ADVIL,MOTRIN) 200 MG tablet Take 200 mg by mouth every 6 (six) hours as needed for pain. Pain       No current facility-administered medications for this visit.    ROS: Pertinent items are noted in HPI.  Exam:    BP 110/74  Pulse 62  Ht 5\' 4"  (1.626 m)  Wt 134 lb (60.782 kg)  BMI 22.99 kg/m2  LMP 12/05/2012   Wt  Readings from Last 3 Encounters:  01/03/13 134 lb (60.782 kg)  12/07/12 135 lb (61.236 kg)  03/17/12 129 lb (58.514 kg)     Ht Readings from Last 3 Encounters:  01/03/13 5\' 4"  (1.626 m)  12/07/12 5\' 4"  (1.626 m)  03/17/12 5\' 4"  (1.626 m)    General appearance: alert, cooperative and appears stated age Head: Normocephalic, without obvious abnormality, atraumatic Neck: no adenopathy, supple, symmetrical, trachea midline and thyroid not enlarged, symmetric, no tenderness/mass/nodules Lungs: clear to auscultation bilaterally Breasts: Inspection negative, No nipple retraction or dimpling, No nipple discharge or bleeding, No axillary or supraclavicular adenopathy, Normal to palpation without dominant masses, prior biopsy site noted Heart: regular rate and rhythm Abdomen: soft, non-tender; bowel sounds normal; no masses,  no organomegaly Extremities: extremities normal, atraumatic, no cyanosis or edema Skin: Skin color, texture, turgor normal. No rashes or lesions.  Multiple tattoos and piercing's Lymph nodes: Cervical, supraclavicular, and axillary nodes normal. No abnormal inguinal nodes palpated Neurologic: Grossly normal   Pelvic: External genitalia:  no lesions              Urethra:  normal appearing urethra with no masses, tenderness  or lesions              Bartholins and Skenes: normal                 Vagina: normal appearing vagina with normal color and discharge, no lesions              Cervix: normal appearance              Pap taken: yes        Bimanual Exam:  Uterus:  uterus is normal size, shape, consistency and nontender                                      Adnexa: normal adnexa in size, nontender and no masses                                      Rectovaginal: Confirms                                      Anus:  normal sphincter tone, no lesions  A: well woman     P: pap smear with HR HPV counseled on breast self exam return annually or prn   Discussed importance of  confirming clean new needles with piercings and tattoos Cycle delay probably related to recent stress  Declines STD screening, HCV testing  An After Visit Summary was printed and given to the patient.

## 2013-01-13 ENCOUNTER — Encounter: Payer: Self-pay | Admitting: Gynecology

## 2013-01-17 ENCOUNTER — Encounter: Payer: Self-pay | Admitting: *Deleted

## 2013-01-19 ENCOUNTER — Other Ambulatory Visit: Payer: Self-pay | Admitting: Gynecology

## 2013-01-19 ENCOUNTER — Telehealth: Payer: Self-pay | Admitting: Gynecology

## 2013-01-19 ENCOUNTER — Encounter: Payer: Self-pay | Admitting: Gynecology

## 2013-01-19 DIAGNOSIS — IMO0002 Reserved for concepts with insufficient information to code with codable children: Secondary | ICD-10-CM

## 2013-01-19 NOTE — Telephone Encounter (Signed)
Call to patient to schedule colpo.  Female partner so no contraception needed.  Menses due in new few days and lasts approx 5-7 days.  Colpo sched for 02-01-13.

## 2013-01-19 NOTE — Progress Notes (Signed)
This encounter was created in error - please disregard.

## 2013-01-19 NOTE — Telephone Encounter (Signed)
Discussed pt's PAP results-HGSIL, explained the effects of HPV infections, reviewed colposcopy procedure.  Recommend pt pretreat with motrin 600-800mg , to call after menses to schedule. All questions addressed

## 2013-02-01 ENCOUNTER — Ambulatory Visit (INDEPENDENT_AMBULATORY_CARE_PROVIDER_SITE_OTHER): Payer: BC Managed Care – PPO | Admitting: Gynecology

## 2013-02-01 VITALS — BP 94/64

## 2013-02-01 DIAGNOSIS — R87613 High grade squamous intraepithelial lesion on cytologic smear of cervix (HGSIL): Secondary | ICD-10-CM

## 2013-02-01 DIAGNOSIS — IMO0002 Reserved for concepts with insufficient information to code with codable children: Secondary | ICD-10-CM

## 2013-02-01 NOTE — Procedures (Signed)
32 y.o. Single Caucasian female   here for colposcopy. 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  Contraception:  no method   Blood pressure 94/64, last menstrual period 01/21/2013.  Procedure explained and patient's questions were invited and answered.  Consent form signed.    Role of HPV in genesis of SIL discussed with patient, and questions answered.   Speculum inserted atraumatically and cervix visualized.  3% acetic acid applied.  Cervix examined using 3.75, 7.5,15  X magnification and green filter.    Gross appearance:normal  Squamocolumnar junction seen in entirety: yes  Extent of lesion entirely seen: yes   acetowhite lesion(s) noted at 5,7 and 12 o'clock, abnormal vessels noted at 5 o'clock and polyp noted at 3 o'clockendocervical speculum placed, SCJ visualized - lesion at 5,7 and 12 o'clock, endocervical curettage performed, cervical biopsies taken at 5,7 and 12 o'clock, specimen labelled and sent to pathology and hemostasis achieved with Monsel's solution   See picture in progress note        Patient tolerated procedure well.   Plan:  Will base further treatment on Pathology findings. 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

## 2013-02-01 NOTE — Progress Notes (Signed)
Subjective:     Patient ID: Donna Mcdowell, female   DOB: [birth year], 32 y.o.   MRN: [id number]  HPI here for colposcopy for HGSIL, findings as noted below   Review of Systems     Objective:   Physical Exam  Genitourinary:         Assessment:     HGSIL- bx at 5,7,12 ECC     Plan:     Triage based on results

## 2013-02-06 ENCOUNTER — Telehealth: Payer: Self-pay | Admitting: *Deleted

## 2013-02-09 ENCOUNTER — Telehealth: Payer: Self-pay | Admitting: *Deleted

## 2013-02-09 NOTE — Telephone Encounter (Signed)
Message copied by Alisa Graff on Thu Feb 09, 2013  5:45 PM ------      Message from: Douglass Rivers      Created: Tue Feb 07, 2013 10:38 AM       Pt needs LEEP based on all bx CIN II, will probably need pre-procedure ativan, please ask pt to come with transportation ------

## 2013-02-09 NOTE — Telephone Encounter (Signed)
Patient notified of pathology report and Dr Lathrop's recommendation for LEEP.  Procedure explained and scheduled for 03-07-13 based on next menses and patient travel plans.  Menses due approx 02-24-13 and patient in same sex relationship so denies potential for pregnancy.  Advised to notify office if menses is significantly early or late.  Instructed to take Motrin 800 mg 1 hr prior with food. She did not ask about Ativan and did not seem upset.  Do you want me to advise her of this option?

## 2013-02-13 NOTE — Telephone Encounter (Signed)
No, but if needed will offer that day, I expect she will come with a ride

## 2013-03-07 ENCOUNTER — Ambulatory Visit: Payer: Self-pay | Admitting: Gynecology

## 2013-03-09 ENCOUNTER — Ambulatory Visit (INDEPENDENT_AMBULATORY_CARE_PROVIDER_SITE_OTHER): Payer: BC Managed Care – PPO | Admitting: Gynecology

## 2013-03-09 DIAGNOSIS — R87613 High grade squamous intraepithelial lesion on cytologic smear of cervix (HGSIL): Secondary | ICD-10-CM

## 2013-03-09 DIAGNOSIS — N871 Moderate cervical dysplasia: Secondary | ICD-10-CM | POA: Insufficient documentation

## 2013-03-09 DIAGNOSIS — IMO0002 Reserved for concepts with insufficient information to code with codable children: Secondary | ICD-10-CM

## 2013-03-09 NOTE — Patient Instructions (Signed)
Loop Electrosurgical Excision Procedure  Loop electrosurgical excision procedure (LEEP) is the removal of a portion of the lower part of the uterus (cervix). The procedure is done when there are significantly abnormal cervical cell changes. Abnormal cell changes of the cervix can lead to cancer if left in place and untreated.     The LEEP procedure itself typically only takes a few minutes. Often, it may be done in your caregiver's office. The procedure is considered safe for those who wish to get pregnant or are trying to get pregnant. Only under rare circumstances should this procedure be done if you are pregnant.  LET YOUR CAREGIVER KNOW ABOUT:  · Whether you are pregnant or late for your last menstrual period.  · Allergies to foods or medicines.  · All the medicines you are taking including herbs, eyedrops, and over-the-counter medicines, and creams.  · Use of steroids (by mouth or creams).  · Previous problems with anesthetics or numbing medicine.  · Previous gynecological surgery.  · History of blood clots or bleeding problems.  · Any recent or current vaginal infections (herpes, sexually transmitted infections).  · Other health problems.  RISKS AND COMPLICATIONS  · Bleeding.  · Infection.  · Injury to the vagina, bladder, or rectum.  · Very rare obstruction of the cervical opening that causes problems during menstruation (cervical stenosis).  BEFORE THE PROCEDURE  · Do not take aspirin or blood thinners (anticoagulants) for 1 week before the procedure, or as told by your caregiver.  · Eat a light meal before the procedure.  · Ask your caregiver about changing or stopping your regular medicines.  · You may be given a pain reliever 1 or 2 hours before the procedure.  PROCEDURE   · A tool (speculum) is placed in the vagina. This allows your caregiver to see the cervix.  · An iodine stain is applied to the cervix to find the area of abnormal cells to be removed.  · Medicine is injected to numb the cervix (local  anesthetic).    · Electricity is passed through a thin wire loop which is then used to remove (cauterize) a small segment of the affected cervix.  · Light electrocautery is used to seal any small blood vessels and prevent bleeding.  · A paste may be applied to the cauterized area of the cervix to help prevent bleeding.  · The tissue sample is sent to the lab. It is examined under the microscope.  AFTER THE PROCEDURE  · Have someone drive you home.  · You may have slight to moderate cramping.  · You may notice a black vaginal discharge from the paste used on the cervix to prevent bleeding. This is normal.  · Watch for excessive bleeding. This requires immediate medical care.  · Ask when your test results will be ready. Make sure you get your test results.  Document Released: 12/19/2002 Document Revised: 12/21/2011 Document Reviewed: 03/10/2011  [employer]® Patient Information ©2014 [employer].

## 2013-03-09 NOTE — Procedures (Addendum)
32 y.o. Single caucasion female here for LEEP.    Pap History:high-grade squamous intraepithelial neoplasia  (HGSIL-encompassing moderate and severe dysplasia) on Pap done January 05, 2013  Patient's last menstrual period was 02/21/2013. lesbian  Pre-procedure vitals: Last menstrual period 02/21/2013.   Procedure explained and patient's questions were invited and answered.   Consent form signed.  Pre-procedure medication:  Motrin 800mg   Procedure Set-up: Grounding pad located leftthigh.  Cautery settings:60 cut/65coagulation.  Suction applied to coated speculum.  Procedure:  Speculum placed with good visualization of the cervix.  Colposcopy performed showing:  acetowhite lesion(s) noted at 12, 5, 7 o'clock and mosaicism noted at 5 o'clock.  Cervix anesthetized using 2% Xylocaine with 1:100,000units Epinephrine with 0.25%marcaine.  10 cc's used.Entire transition zone with 20mm loop in 2passes. Endocervcial button obtained with 12mm Specimen(s) placed on cork and labeled for pathology.  Hemostasis obtained with ball cautery and Monsel's solution.  EBL:  Minimal  Complications:none  Patient tolerated procedure well and left the office in satisfactory condition.  Plan:  After visit summary given.  Repeat pap depending on pathology

## 2013-03-13 LAB — IPS CERVICAL/ECC/EMB/VULVAR/VAGINAL BIOPSY

## 2013-03-14 ENCOUNTER — Telehealth: Payer: Self-pay | Admitting: Gynecology

## 2013-03-14 NOTE — Telephone Encounter (Signed)
Patient had surgery recently and wants to know if she is okay to get in the ocean or pool?

## 2013-03-14 NOTE — Telephone Encounter (Signed)
See note below. Please advise.  

## 2013-03-15 ENCOUNTER — Telehealth: Payer: Self-pay | Admitting: Gynecology

## 2013-03-15 NOTE — Telephone Encounter (Signed)
Verbal order per Dr. Farrel Gobble of reason of not ok to get in the ocean or pool from having had surgery. Advised patient of not okay to get in the ocean or pool  Per Dr. Farrel Gobble due to soon after surgery and risk of bleeding and infection. Patient not happy with this response and stated would not have had surgery procedure done had she known this and hung up phone,

## 2013-03-15 NOTE — Telephone Encounter (Signed)
LM to call regarding pathology

## 2013-03-15 NOTE — Telephone Encounter (Signed)
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  Pt informed area treated with cautery afterwards. Stressed to pt importance of regular annual PAP's for 20y,  Stressed importance of pelvic rest at this time as she had wanted to go to the beach this weekend. All questions were addressed

## 2013-03-15 NOTE — Telephone Encounter (Signed)
Patient returning phone  Dr.Lathrop

## 2013-03-24 ENCOUNTER — Telehealth: Payer: Self-pay | Admitting: Gynecology

## 2013-03-24 NOTE — Telephone Encounter (Signed)
It's 2 weeks, should be ok

## 2013-03-24 NOTE — Telephone Encounter (Signed)
Patient notified of Dr. Farrel Gobble response. sue

## 2013-03-24 NOTE — Telephone Encounter (Signed)
Patient had procedure done LEEP on 03/09/2013 . Calling to day to see if OK to use a tampon. Has started her cycle this morning and did not want to go out and purchase unnecessary maxi pads if it is ok for her to use a Tampon. States she has a tampon in but if should not have will go purchase. Please advise.sue

## 2013-03-24 NOTE — Telephone Encounter (Signed)
Patient has had a procedure done and is wanting to know if she can use a tampon.

## 2013-05-25 ENCOUNTER — Telehealth: Payer: Self-pay | Admitting: *Deleted

## 2013-05-25 NOTE — Telephone Encounter (Signed)
Follow up call to patient, LMTCB. 

## 2013-06-15 ENCOUNTER — Ambulatory Visit (INDEPENDENT_AMBULATORY_CARE_PROVIDER_SITE_OTHER): Payer: BC Managed Care – PPO | Admitting: Family Medicine

## 2013-06-15 VITALS — BP 104/68 | HR 86 | Temp 98.0°F | Resp 20 | Ht 63.75 in | Wt 135.0 lb

## 2013-06-15 DIAGNOSIS — M545 Low back pain: Secondary | ICD-10-CM

## 2013-06-15 MED ORDER — CYCLOBENZAPRINE HCL 10 MG PO TABS: 10.0000 mg | ORAL_TABLET | Freq: Two times a day (BID) | ORAL | Status: DC | PRN

## 2013-06-15 MED ORDER — MELOXICAM 7.5 MG PO TABS: 7.5000 mg | ORAL_TABLET | Freq: Every day | ORAL | Status: DC

## 2013-06-15 NOTE — Patient Instructions (Addendum)
Use the medications as needed.  If you are not better in the next few days please let me know- Sooner if worse.

## 2013-06-15 NOTE — Progress Notes (Signed)
[facility] [address] [place] [address] [phone]  Date:  06/15/2013   Name:  Donna Mcdowell   DOB:  October 07, 1981   MRN:  [id number]  PCP:  Donna Alm, MD    Chief Complaint: Back Pain   History of Present Illness:  Donna Mcdowell is a 32 y.o. very pleasant female patient who presents with the following:  She is here today with LBP.  She has noted pain if she sits in a certain way for a long time. However, she has noted constant pain since she awoke last week.  It is worse when she is seated.  She feels ok laying flat.  The pain is located in the center of her low back.   No radiation down her legs.  Her legs feel tired when she stands.  No leg weakness or numbness.   There is no known injury.  She has not been lifting or exercising more.   LMP less than one month ago.  She is a smoker  Patient Active Problem List   Diagnosis Date Noted  . CIN II (cervical intraepithelial neoplasia II) 03/09/2013  . Routine gynecological examination 01/03/2013  . Breast infection, left 03/17/2012    Past Medical History  Diagnosis Date  . Breast pain in female     breast abscess  . Fatigue   . Wears glasses     wears contacts     Past Surgical History  Procedure Laterality Date  . Hand surgery    . Breast surgery Left     breast abscess, history of peircing on that breast    History  Substance Use Topics  . Smoking status: Current Every Day Smoker -- 1.00 packs/day    Types: Cigarettes  . Smokeless tobacco: Not on file  . Alcohol Use: Yes    Family History  Problem Relation Age of Onset  . Breast cancer Mother 61  . Cancer Mother   . Osteoarthritis Maternal Grandmother   . Cancer Maternal Grandmother   . Osteoarthritis Maternal Grandfather   . Heart failure Maternal Grandfather     Allergies  Allergen Reactions  . Oxycodone Base Nausea And Vomiting    Medication list has been reviewed and updated.  Current Outpatient  Prescriptions on File Prior to Visit  Medication Sig Dispense Refill  . acetaminophen (TYLENOL) 500 MG tablet Take 1,000 mg by mouth every 6 (six) hours as needed. pain       . ibuprofen (ADVIL,MOTRIN) 200 MG tablet Take 200 mg by mouth every 6 (six) hours as needed for pain. Pain       No current facility-administered medications on file prior to visit.    Review of Systems:  As per HPI- otherwise negative. No leg numbness or weakness, no bowel or bladder incontinence   Physical Examination: Filed Vitals:   06/15/13 1209  BP: 104/68  Pulse: 86  Temp: 98 F (36.7 C)  Resp: 20   Filed Vitals:   06/15/13 1209  Height: 5' 3.75" (1.619 m)  Weight: 135 lb (61.236 kg)   Body mass index is 23.36 kg/(m^2). Ideal Body Weight: Weight in (lb) to have BMI = 25: 144.2  GEN: WDWN, NAD, Non-toxic, A & O x 3, looks well HEENT: Atraumatic, Normocephalic. Neck supple. No masses, No LAD. Ears and Nose: No external deformity. CV: RRR, No M/G/R. No JVD. No thrill. No extra heart sounds. PULM: CTA B, no wheezes, crackles, rhonchi. No retractions. No resp.  distress. No accessory muscle use. EXTR: No c/c/e NEURO Normal gait.  PSYCH: Normally interactive. Conversant. Not depressed or anxious appearing.  Calm demeanor.  Back: mild tenderness over the lumbar area and upper gluteal cleft.  No evidence of abscess.  Negative SLR bilaterally.  Normal LE strength, sensation and DTR.    Assessment and Plan: Lumbar pain - Plan: meloxicam (MOBIC) 7.5 MG tablet, cyclobenzaprine (FLEXERIL) 10 MG tablet  Treat for a lumbar strain.  If not better she is to let me know Avoid other NSAIDs while on mobic, watch for sedation with flexeril  Signed Abbe Amsterdam, MD

## 2013-07-14 NOTE — Telephone Encounter (Signed)
Follow up call to patient.  Advised that during physician review (standard process in our office for pathology cases) physicians discussed that they would prefer to be more conservative in their follow up than guidelines recommend.  We recommend pap in 6 month instead of the year that was initially discussed.  Patient agreeable and pap scheduled for 09-13-13 with Dr Farrel Gobble  06 Recall entered for 09-25-13.

## 2013-07-20 ENCOUNTER — Encounter: Payer: Self-pay | Admitting: Gynecology

## 2013-09-13 ENCOUNTER — Encounter: Payer: Self-pay | Admitting: Gynecology

## 2013-09-13 ENCOUNTER — Ambulatory Visit: Payer: Self-pay | Admitting: Gynecology

## 2013-09-13 ENCOUNTER — Encounter: Payer: Self-pay | Admitting: Certified Nurse Midwife

## 2013-09-13 ENCOUNTER — Telehealth: Payer: Self-pay | Admitting: Gynecology

## 2013-09-13 ENCOUNTER — Ambulatory Visit (INDEPENDENT_AMBULATORY_CARE_PROVIDER_SITE_OTHER): Payer: BC Managed Care – PPO | Admitting: Gynecology

## 2013-09-13 VITALS — BP 110/60 | HR 64 | Resp 14 | Ht 64.0 in | Wt 136.0 lb

## 2013-09-13 DIAGNOSIS — D069 Carcinoma in situ of cervix, unspecified: Secondary | ICD-10-CM

## 2013-09-13 NOTE — Telephone Encounter (Signed)
Call to patient and left message need to reschedule missed appointment.

## 2013-09-13 NOTE — Progress Notes (Signed)
Subjective:     Patient ID: Donna Mcdowell, female   DOB: 1981/05/18, 32 y.o.   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  Pt denies irregular bleeding since, no post-coital bleeding.      Review of Systems  Constitutional: Negative for fever and chills.  Genitourinary: Negative for vaginal bleeding, vaginal discharge and vaginal pain.       Objective:   Physical Exam  Nursing note and vitals reviewed. Constitutional: She is oriented to person, place, and time. She appears well-developed and well-nourished.  Genitourinary: There is no rash or tenderness on the right labia. There is no rash or tenderness on the left labia. Cervix exhibits no motion tenderness. Vaginal discharge (heavy white) found.    Lymphadenopathy:       Right: No inguinal adenopathy present.       Left: No inguinal adenopathy present.  Neurological: She is alert and oriented to person, place, and time.       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

## 2013-09-13 NOTE — Telephone Encounter (Signed)
Patient dnka 6 mo pap with Dr. Farrel Gobble. I left a message for the patient to call and reschedule.

## 2013-09-13 NOTE — Telephone Encounter (Signed)
Routed to Ms.Sally 

## 2013-09-15 NOTE — Telephone Encounter (Signed)
Patient called back and came in for appointment, seen 09-13-13 and pap completed.  Routing to provider for final review.  Will close encounter

## 2013-09-22 ENCOUNTER — Telehealth: Payer: Self-pay | Admitting: *Deleted

## 2013-09-22 NOTE — Telephone Encounter (Signed)
Message copied by Lorraine Lax on Fri Sep 22, 2013 11:53 AM ------      Message from: Douglass Rivers      Created: Fri Sep 22, 2013 11:42 AM       Inform better than before leep, cervix healing well, will repeat again at annual recall 8 ------

## 2013-09-22 NOTE — Telephone Encounter (Signed)
Pt is returning a call to Jasmine. °

## 2013-09-22 NOTE — Telephone Encounter (Signed)
Patient notified see labs 

## 2013-09-22 NOTE — Telephone Encounter (Signed)
Left Message To Call Back  

## 2013-12-25 ENCOUNTER — Encounter: Payer: BC Managed Care – PPO | Admitting: Family Medicine

## 2014-01-04 ENCOUNTER — Ambulatory Visit: Payer: BC Managed Care – PPO | Admitting: Gynecology

## 2014-01-05 ENCOUNTER — Encounter: Payer: Self-pay | Admitting: Gynecology

## 2014-01-05 ENCOUNTER — Ambulatory Visit (INDEPENDENT_AMBULATORY_CARE_PROVIDER_SITE_OTHER): Payer: BC Managed Care – PPO | Admitting: Gynecology

## 2014-01-05 VITALS — BP 102/66 | Resp 18 | Ht 63.75 in | Wt 137.0 lb

## 2014-01-05 DIAGNOSIS — D069 Carcinoma in situ of cervix, unspecified: Secondary | ICD-10-CM | POA: Insufficient documentation

## 2014-01-05 DIAGNOSIS — Z01419 Encounter for gynecological examination (general) (routine) without abnormal findings: Secondary | ICD-10-CM

## 2014-01-05 DIAGNOSIS — Z87410 Personal history of cervical dysplasia: Secondary | ICD-10-CM

## 2014-01-05 NOTE — Progress Notes (Signed)
33 y.o. Married Caucasian female   G0P0 here for annual exam. Pt is currently sexually active. Pt denies vaginal discharge, no dyspareunia.  History of LEEP for CIN II, + margin, f/u PAP ASCUS.     Patient's last menstrual period was 12/29/2013.          Sexually active: yes  The current method of family planning is not indicated.    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 Tetanus/tdap  12/19/2001  . Influenza Vaccine  05/12/2013  . Pap Smear  09/13/2014    Family History  Problem Relation Age of Onset  . Breast cancer Mother 46  . Cancer Mother   . Osteoarthritis Maternal Grandmother   . Cancer Maternal Grandmother   . Osteoarthritis Maternal Grandfather   . Heart failure Maternal Grandfather     Patient Active Problem List   Diagnosis Date Noted  . CIN II (cervical intraepithelial neoplasia II) 03/09/2013  . Routine gynecological examination 01/03/2013  . Breast infection, left 03/17/2012    Past Medical History  Diagnosis Date  . Breast pain in female     breast abscess  . Fatigue   . Wears glasses     wears contacts   . 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 Hand surgery    . Breast surgery Left     breast abscess, history of peircing on that breast  . Cervical biopsy  w/ loop electrode excision  02/2013    CIN II-III    Allergies: Oxycodone base  Current Outpatient Prescriptions  Medication Sig Dispense Refill  . acetaminophen (TYLENOL) 500 MG tablet Take 1,000 mg by mouth every 6 (six) hours as needed. pain       . cyclobenzaprine (FLEXERIL) 10 MG tablet Take 10 mg by mouth 3 (three) times daily as needed for muscle spasms.      Marland Kitchen ibuprofen (ADVIL,MOTRIN) 200 MG tablet Take 200 mg by mouth every  6 (six) hours as needed for pain. Pain      . meloxicam (MOBIC) 7.5 MG tablet Take 1 tablet (7.5 mg total) by mouth daily. Take 2 if needed, for back pain  40 tablet  0   No current facility-administered medications for this visit.    ROS: Pertinent items are noted in HPI.  Exam:    BP 102/66  Resp 18  Ht 5' 3.75" (1.619 m)  Wt 137 lb (62.143 kg)  BMI 23.71 kg/m2  LMP 12/29/2013 Weight change: @WEIGHTCHANGE @ Last 3 height recordings:  Ht Readings from Last 3 Encounters:  01/05/14 5' 3.75" (1.619 m)  09/13/13 5\' 4"  (1.626 m)  06/15/13 5' 3.75" (1.619 m)   General appearance: alert, cooperative and appears stated age Head: Normocephalic, without obvious abnormality, atraumatic Neck: no adenopathy, no carotid bruit, no JVD, supple, symmetrical, trachea midline and thyroid not enlarged, symmetric, no tenderness/mass/nodules Lungs: clear to auscultation bilaterally Breasts: normal appearance, no masses or tenderness Heart: regular rate and rhythm, S1, S2 normal, no murmur, click, rub or gallop Abdomen: soft, non-tender; bowel sounds normal; no masses,  no organomegaly Extremities: extremities normal, atraumatic, no cyanosis or edema Skin: Skin color, texture, turgor normal. No rashes or lesions Lymph nodes: Cervical, supraclavicular,  and axillary nodes normal. no inguinal nodes palpated Neurologic: Grossly normal   Pelvic: External genitalia:  no lesions              Urethra: normal appearing urethra with no masses, tenderness or lesions              Bartholins and Skenes: Bartholin's, Urethra, Skene's normal                 Vagina: normal appearing vagina with normal color and discharge, no lesions              Cervix: normal appearance, S/P LEEP healed well              Pap taken: yes        Bimanual Exam:  Uterus:  uterus is normal size, shape, consistency and nontender                                      Adnexa:    normal adnexa in size, nontender and no masses                                       Rectovaginal: Confirms                                      Anus:  normal sphincter tone, no lesions  A: well woman S/P LEEP for CIN II-III     P: pap smear annually, reviewed with pt counseled on breast self exam, adequate intake of calcium and vitamin D, diet and exercise return annually or prn   An After Visit Summary was printed and given to the patient.

## 2014-01-09 LAB — IPS PAP TEST WITH HPV

## 2014-02-14 ENCOUNTER — Encounter: Payer: Self-pay | Admitting: Family Medicine

## 2014-02-14 ENCOUNTER — Ambulatory Visit: Payer: BC Managed Care – PPO

## 2014-02-14 ENCOUNTER — Ambulatory Visit (INDEPENDENT_AMBULATORY_CARE_PROVIDER_SITE_OTHER): Payer: BC Managed Care – PPO | Admitting: Family Medicine

## 2014-02-14 VITALS — BP 110/64 | HR 86 | Temp 97.9°F | Resp 16 | Ht 63.0 in | Wt 134.2 lb

## 2014-02-14 DIAGNOSIS — M545 Low back pain, unspecified: Secondary | ICD-10-CM

## 2014-02-14 DIAGNOSIS — M533 Sacrococcygeal disorders, not elsewhere classified: Secondary | ICD-10-CM

## 2014-02-14 MED ORDER — MELOXICAM 7.5 MG PO TABS: 7.5000 mg | ORAL_TABLET | Freq: Every day | ORAL | Status: DC

## 2014-02-14 NOTE — Patient Instructions (Signed)
Use the mobic once a day for 7- 10 days, and use a donut pillow to get weight off of your tailbone.  If these measures do not help please let me know.

## 2014-02-14 NOTE — Progress Notes (Signed)
[facility] [address] [place] [address] [phone]  Date:  02/14/2014   Name:  Donna Mcdowell   DOB:  [birth year]   MRN:  [id number]  PCP:  Azalia Bilis, MD    Chief Complaint: Back Pain   History of Present Illness:  Donna Mcdowell is a 33 y.o. very pleasant female patient who presents with the following:  She is here today with recurrent lower back pain.  She was here for this in the fall, and has continued to notice it off an on since then.  Bothers her more with prolonged sitting- laying down or walking is ok.   She does not have radiation into her legs in general.  No leg numbness or weakness.  No bowel or bladder control issues.   We used mobic and flexeril for her in the past   This has been an issue for the last 9 months or so.  Over the last few months she has noted that the pain seems to be more in her tailbone.    Patient Active Problem List   Diagnosis Date Noted  . Severe cervical dysplasia   . Routine gynecological examination 01/03/2013  . Breast infection, left 03/17/2012    Past Medical History  Diagnosis Date  . Breast pain in female     breast abscess  . Fatigue   . Wears glasses     wears contacts   . 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 Hand surgery    . Breast surgery Left     breast abscess, history of peircing on that breast  . Cervical biopsy  w/ loop electrode excision  02/2013    CIN II-III    History  Substance Use Topics  . Smoking status: Current Every Day Smoker -- 1.00 packs/day    Types: Cigarettes  . Smokeless tobacco: Not on file  . Alcohol Use: Yes    Family History  Problem Relation Age of Onset  . Breast cancer Mother 11  . Cancer Mother   . Osteoarthritis Maternal Grandmother   . Cancer Maternal Grandmother   . Osteoarthritis Maternal Grandfather   . Heart failure Maternal Grandfather     Allergies  Allergen  Reactions  . Oxycodone Base Nausea And Vomiting    Medication list has been reviewed and updated.  Current Outpatient Prescriptions on File Prior to Visit  Medication Sig Dispense Refill  . acetaminophen (TYLENOL) 500 MG tablet Take 1,000 mg by mouth every 6 (six) hours as needed. pain       . ibuprofen (ADVIL,MOTRIN) 200 MG tablet Take 200 mg by mouth every 6 (six) hours as needed for pain. Pain      . meloxicam (MOBIC) 7.5 MG tablet Take 1 tablet (7.5 mg total) by mouth daily. Take 2 if needed, for back pain  40 tablet  0  . cyclobenzaprine (FLEXERIL) 10 MG tablet Take 10 mg by mouth 3 (three) times daily as needed for muscle spasms.       No current facility-administered medications on file prior to visit.    Review of Systems:  .apser   Physical Examination: Filed Vitals:   02/14/14 1626  BP: 110/64  Pulse: 86  Temp: 97.9 F (36.6 C)  Resp: 16   Filed Vitals:   02/14/14 1626  Height: 5\' 3"  (1.6 m)  Weight: 134 lb 3.2 oz (60.873 kg)  Body mass index is 23.78 kg/(m^2). Ideal Body Weight: Weight in (lb) to have BMI = 25: 140.8   GEN: WDWN, NAD, Non-toxic, A & O x 3, looks well HEENT: Atraumatic, Normocephalic. Neck supple. No masses, No LAD. Ears and Nose: No external deformity. CV: RRR, No M/G/R. No JVD. No thrill. No extra heart sounds. PULM: CTA B, no wheezes, crackles, rhonchi. No retractions. No resp. distress. No accessory muscle use. ABD: S, NT, ND, +BS. No rebound. No HSM. EXTR: No c/c/e NEURO Normal gait.  PSYCH: Normally interactive. Conversant. Not depressed or anxious appearing.  Calm demeanor.  She is slightly tender over the coccyx.  No evidence of abscess.  Normal BLE strength, sensation and DTR bilaterally. Normal ankle and knee reflexes bilaterally, normal spine flexion and extension  [facility] reading (PRIMARY) by  Dr. Lorelei Pont. L spine: negative Sacrum/ coccyx: negative  .LUMBAR SPINE - 2-3 VIEW  COMPARISON: None.  FINDINGS: Three views of  lumbar spine submitted. No acute fracture or subluxation. Alignment, disc spaces and vertebral heights are preserved.  IMPRESSION: Negative. SACRUM AND COCCYX - 2+ VIEW  COMPARISON: None.  FINDINGS: There is no evidence of fracture or other focal bone lesions.  IMPRESSION: Negative.  Assessment and Plan: Coccydynia - Plan: DG Lumbar Spine 2-3 Views, DG Sacrum/Coccyx  Lumbar pain - Plan: meloxicam (MOBIC) 7.5 MG tablet  Treat for coccydynia with mobic.  Recommend a donut pillow to take pressure off the area.  If not better in a week or so she will let me know.    Signed Lamar Blinks, MD

## 2014-09-04 ENCOUNTER — Ambulatory Visit: Payer: BC Managed Care – PPO | Admitting: Family Medicine

## 2014-09-04 ENCOUNTER — Ambulatory Visit (INDEPENDENT_AMBULATORY_CARE_PROVIDER_SITE_OTHER): Payer: BC Managed Care – PPO | Admitting: Family Medicine

## 2014-09-04 ENCOUNTER — Encounter: Payer: Self-pay | Admitting: Family Medicine

## 2014-09-04 VITALS — BP 101/67 | HR 71 | Temp 97.5°F | Resp 16 | Ht 64.0 in | Wt 134.4 lb

## 2014-09-04 DIAGNOSIS — Z7184 Encounter for health counseling related to travel: Secondary | ICD-10-CM

## 2014-09-04 DIAGNOSIS — Z7189 Other specified counseling: Secondary | ICD-10-CM

## 2014-09-04 DIAGNOSIS — Z23 Encounter for immunization: Secondary | ICD-10-CM

## 2014-09-04 DIAGNOSIS — Z720 Tobacco use: Secondary | ICD-10-CM

## 2014-09-04 MED ORDER — TYPHOID VACCINE PO CPDR: 1.0000 | DELAYED_RELEASE_CAPSULE | ORAL | Status: DC

## 2014-09-05 LAB — HEPATITIS B SURFACE ANTIBODY, QUANTITATIVE: Hep B S AB Quant (Post): 61.2 m[IU]/mL

## 2014-09-06 NOTE — Progress Notes (Signed)
   Subjective:    Patient ID: Donna Mcdowell, female    DOB: March 18, 1981, 33 y.o.   MRN: [id number]  HPI This is a 33 yo female who presents today for immunizations for travel. She is going to [place] and [place] in about a month. She has a record of childhood immunizations on her phone. She is healthy and has no complaints today.  Past Medical History  Diagnosis Date  . Breast pain in female     breast abscess  . Fatigue   . Wears glasses     wears contacts   . 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 Hand surgery    . Breast surgery Left     breast abscess, history of peircing on that breast  . Cervical biopsy  w/ loop electrode excision  02/2013    CIN II-III   Family History  Problem Relation Age of Onset  . Breast cancer Mother 50  . Cancer Mother   . Osteoarthritis Maternal Grandmother   . Cancer Maternal Grandmother   . Osteoarthritis Maternal Grandfather   . Heart failure Maternal Grandfather    History  Substance Use Topics  . Smoking status: Current Every Day Smoker -- 1.00 packs/day    Types: Cigarettes  . Smokeless tobacco: Not on file  . Alcohol Use: Yes    Review of Systems     Objective:   Physical Exam  Physical Exam  Vitals reviewed. Constitutional: She is oriented to person, place, and time. She appears well-developed and well-nourished.  HENT:  Head: Normocephalic and atraumatic.  Eyes: Conjunctivae are normal.  Neck: Normal range of motion. Neck supple.  Cardiovascular: Normal rate.   Pulmonary/Chest: Effort normal.  Musculoskeletal: Normal range of motion.  Neurological: She is alert and oriented to person, place, and time.  Skin: Skin is warm and dry.  Psychiatric: She has a normal mood and affect. Her behavior is normal. Judgment and thought content normal.   BP 101/67 mmHg  Pulse 71  Temp(Src) 97.5 F (36.4 C) (Oral)  Resp 16  Ht 5\' 4"  (1.626 m)  Wt 134 lb 6.4 oz  (60.963 kg)  BMI 23.06 kg/m2  SpO2 99%  LMP 09/04/2014    Assessment & Plan:  1. Counseling for travel - Hepatitis B surface antibody - Encouraged patient to go to http://www.gilbert-cooper.com/ web site for travel health information for the countries she will be visiting - Discussed need for insect repellent as many illnesses she may encounter are transmitted via mosquito.  2. Need for Tdap vaccination - Tdap vaccine greater than or equal to 7yo IM  3. Need for prophylactic vaccination and inoculation against viral hepatitis - Hepatitis A vaccine adult IM - Hepatitis A vaccine adult IM; Future  4. Flu vaccine need - Flu Vaccine QUAD 36+ mos IM  5. Need for immunization against typhoid - typhoid (VIVOTIF) DR capsule; Take 1 capsule by mouth every other day. X 4 doses.  Dispense: 4 capsule; Refill: 0  6. Tobacco abuse - Encouraged smoking cessation - Patient planning on stopping smoking at the first of the year.    Elby Beck, FNP-BC  [facility]  09/06/2014 8:37 AM

## 2014-09-11 ENCOUNTER — Telehealth: Payer: Self-pay

## 2014-09-11 NOTE — Telephone Encounter (Signed)
Spoke with pt and rescheduled her AEX for 01/09/15 with Ms. Debbi

## 2014-09-26 ENCOUNTER — Telehealth (INDEPENDENT_AMBULATORY_CARE_PROVIDER_SITE_OTHER): Payer: Self-pay

## 2014-09-26 NOTE — Telephone Encounter (Signed)
You saw this patient last in 2013. Pt has a hx of infection in her LT breast from a piercing that you have done procedures on in the past. Pt wants an order for antibiotics because it is becoming infected again. I informed the pt that you may not write the order with out seeing the pt. She informed me that she was going out of the country on 12/26 and will be gone for 2 weeks. I informed pt that I would send you a note. Pt verbalized understanding. Please advise.

## 2015-01-08 ENCOUNTER — Ambulatory Visit: Payer: Self-pay | Admitting: Family Medicine

## 2015-01-09 ENCOUNTER — Encounter: Payer: Self-pay | Admitting: Certified Nurse Midwife

## 2015-01-09 ENCOUNTER — Ambulatory Visit: Payer: Self-pay | Admitting: Certified Nurse Midwife

## 2015-01-09 ENCOUNTER — Ambulatory Visit: Payer: BC Managed Care – PPO | Admitting: Gynecology

## 2015-05-02 ENCOUNTER — Telehealth: Payer: Self-pay | Admitting: *Deleted

## 2015-05-02 NOTE — Telephone Encounter (Signed)
08 Pap recall due March 2016 due to previous LEEP due to CIN II-III  Past History:   01/05/14 Pap, negative with neg HR HPV 09/13/13 Pap, ASCUS 5/29/4 LEEP, CIN II-III 02/01/13 Colpo, CIN II 01/03/13 Pap, HSIL with pos HR HPV  Previous pt of Dr. Charlies Constable.  Pt has not scheduled AEX with our office.  Please call pt to schedule AEX with physician.  Thank you.

## 2015-05-02 NOTE — Telephone Encounter (Signed)
Left voicemail to call back re: AEX

## 2015-05-07 ENCOUNTER — Encounter: Payer: Self-pay | Admitting: Certified Nurse Midwife

## 2015-05-08 NOTE — Telephone Encounter (Signed)
Second attempt to contact patient. Left voicemail to call back to schedule AEX.  

## 2015-05-09 ENCOUNTER — Encounter: Payer: Self-pay | Admitting: *Deleted

## 2015-05-09 NOTE — Telephone Encounter (Signed)
Letter signed.  Out of recall.

## 2015-05-09 NOTE — Telephone Encounter (Signed)
Pt has not returned call or scheduled AEX.  Letter created.  Please advise recall.

## 2015-05-09 NOTE — Telephone Encounter (Signed)
Letter mailed and marked as sent.  Patient removed from current recall.  Closing encounter.

## 2015-08-18 ENCOUNTER — Encounter (HOSPITAL_COMMUNITY): Payer: Self-pay | Admitting: Emergency Medicine

## 2015-08-18 ENCOUNTER — Emergency Department (HOSPITAL_COMMUNITY)
Admission: EM | Admit: 2015-08-18 | Discharge: 2015-08-18 | Disposition: A | Discharge status: Home / Self Care | Payer: BLUE CROSS/BLUE SHIELD | Attending: Emergency Medicine | Admitting: Emergency Medicine

## 2015-08-18 ENCOUNTER — Emergency Department (HOSPITAL_COMMUNITY): Payer: BLUE CROSS/BLUE SHIELD

## 2015-08-18 DIAGNOSIS — Z3202 Encounter for pregnancy test, result negative: Secondary | ICD-10-CM | POA: Insufficient documentation

## 2015-08-18 DIAGNOSIS — Z720 Tobacco use: Secondary | ICD-10-CM | POA: Insufficient documentation

## 2015-08-18 DIAGNOSIS — R1084 Generalized abdominal pain: Secondary | ICD-10-CM

## 2015-08-18 DIAGNOSIS — R1110 Vomiting, unspecified: Secondary | ICD-10-CM | POA: Insufficient documentation

## 2015-08-18 DIAGNOSIS — Z7982 Long term (current) use of aspirin: Secondary | ICD-10-CM | POA: Insufficient documentation

## 2015-08-18 DIAGNOSIS — R109 Unspecified abdominal pain: Secondary | ICD-10-CM | POA: Diagnosis present

## 2015-08-18 DIAGNOSIS — Z87410 Personal history of cervical dysplasia: Secondary | ICD-10-CM | POA: Insufficient documentation

## 2015-08-18 DIAGNOSIS — R197 Diarrhea, unspecified: Secondary | ICD-10-CM | POA: Diagnosis not present

## 2015-08-18 LAB — COMPREHENSIVE METABOLIC PANEL
ALT: 19 U/L (ref 14–54)
AST: 22 U/L (ref 15–41)
Albumin: 4.2 g/dL (ref 3.5–5.0)
Alkaline Phosphatase: 51 U/L (ref 38–126)
Anion gap: 10 (ref 5–15)
BUN: 9 mg/dL (ref 6–20)
CHLORIDE: 102 mmol/L (ref 101–111)
CO2: 24 mmol/L (ref 22–32)
Calcium: 8.9 mg/dL (ref 8.9–10.3)
Creatinine, Ser: 0.63 mg/dL (ref 0.44–1.00)
Glucose, Bld: 78 mg/dL (ref 65–99)
POTASSIUM: 4 mmol/L (ref 3.5–5.1)
SODIUM: 136 mmol/L (ref 135–145)
Total Bilirubin: 1 mg/dL (ref 0.3–1.2)
Total Protein: 6.7 g/dL (ref 6.5–8.1)

## 2015-08-18 LAB — URINALYSIS, ROUTINE W REFLEX MICROSCOPIC
Glucose, UA: NEGATIVE mg/dL
Hgb urine dipstick: NEGATIVE
Ketones, ur: >80 mg/dL — AB
LEUKOCYTES UA: NEGATIVE
NITRITE: NEGATIVE
PH: 6 (ref 5.0–8.0)
Protein, ur: NEGATIVE mg/dL
SPECIFIC GRAVITY, URINE: 1.026 (ref 1.005–1.030)
Urobilinogen, UA: 1 mg/dL (ref 0.0–1.0)

## 2015-08-18 LAB — CBC
HEMATOCRIT: 41.1 % (ref 36.0–46.0)
Hemoglobin: 14 g/dL (ref 12.0–15.0)
MCH: 32.3 pg (ref 26.0–34.0)
MCHC: 34.1 g/dL (ref 30.0–36.0)
MCV: 94.9 fL (ref 78.0–100.0)
Platelets: 163 10*3/uL (ref 150–400)
RBC: 4.33 MIL/uL (ref 3.87–5.11)
RDW: 13 % (ref 11.5–15.5)
WBC: 9.5 10*3/uL (ref 4.0–10.5)

## 2015-08-18 LAB — LIPASE, BLOOD: Lipase: 22 U/L (ref 11–51)

## 2015-08-18 LAB — POC URINE PREG, ED: Preg Test, Ur: NEGATIVE

## 2015-08-18 MED ORDER — HYDROCODONE-ACETAMINOPHEN 5-325 MG PO TABS: 2.0000 | ORAL_TABLET | ORAL | Status: DC | PRN

## 2015-08-18 MED ORDER — ONDANSETRON HCL 4 MG/2ML IJ SOLN
4.0000 mg | Freq: Once | INTRAMUSCULAR | Status: AC
  Administered 2015-08-18: 4 mg via INTRAVENOUS
  Filled 2015-08-18: qty 2

## 2015-08-18 MED ORDER — HYDROMORPHONE HCL 1 MG/ML IJ SOLN
1.0000 mg | Freq: Once | INTRAMUSCULAR | Status: AC
Start: 2015-08-18 — End: 2015-08-18
  Administered 2015-08-18: 1 mg via INTRAVENOUS
  Filled 2015-08-18: qty 1

## 2015-08-18 MED ORDER — ONDANSETRON HCL 4 MG PO TABS: 4.0000 mg | ORAL_TABLET | Freq: Four times a day (QID) | ORAL | Status: DC

## 2015-08-18 MED ORDER — IOHEXOL 300 MG/ML  SOLN: 100.0000 mL | Freq: Once | INTRAMUSCULAR | Status: DC | PRN

## 2015-08-18 MED ORDER — SODIUM CHLORIDE 0.9 % IV BOLUS (SEPSIS)
1000.0000 mL | Freq: Once | INTRAVENOUS | Status: AC
  Administered 2015-08-18: 1000 mL via INTRAVENOUS

## 2015-08-18 NOTE — ED Notes (Signed)
Reports intermittent sharp episodes of generalized abd pain, nausea, vomiting (x2), and diarrhea (x1) since this morning.  States she was seen at an [facility] for same and sent to ED.  No pain at present.  Pain worse with palpation.

## 2015-08-18 NOTE — ED Provider Notes (Signed)
CSN: [id number]     Arrival date & time 08/18/15  1630 History   First MD Initiated Contact with Patient 08/18/15 1725     Chief Complaint  Patient presents with  . Abdominal Pain  . Emesis  . Diarrhea   HPI   Donna Mcdowell is a 34 yo F pw abdominal pain x 1 day. She describes the pain as  transient, 9/10 pain scale, exacerbated by movement. She endorses decreased PO intake, last PO intake around noon today (broth- only drank 1 sip), chills, nausea, vomiting x 2 (NBNB). She states she may have been exposed to GI virus or pathogen because she works in [employer] and [employer], beef, and fish, but she feels like she is very careful regarding cross-contamination. No fever, diarrhea, ill contacts, prior abdominal surgeries, recent antibiotic use, vaginal discharge/itching, dysuria, hematochezia.   Past Medical History  Diagnosis Date  . Breast pain in female     breast abscess  . Fatigue   . Wears glasses     wears contacts   . 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 Hand surgery    . Breast surgery Left     breast abscess, history of peircing on that breast  . Cervical biopsy  w/ loop electrode excision  02/2013    CIN II-III   Family History  Problem Relation Age of Onset  . Breast cancer Mother 18  . Cancer Mother   . Osteoarthritis Maternal Grandmother   . Cancer Maternal Grandmother   . Osteoarthritis Maternal Grandfather   . Heart failure Maternal Grandfather    Social History  Substance Use Topics  . Smoking status: Current Every Day Smoker -- 1.00 packs/day    Types: Cigarettes  . Smokeless tobacco: None  . Alcohol Use: Yes   OB History    Gravida Para Term Preterm AB TAB SAB Ectopic Multiple Living   0              Review of Systems  Ten systems are reviewed and are negative for acute change except as noted in the HPI  Allergies  Oxycodone base  Home Medications   Prior to  Admission medications   Medication Sig Start Date End Date Taking? Authorizing Provider  Aspirin-Acetaminophen (GOODYS BODY PAIN PO) Take 1 packet by mouth daily as needed (Back pain).    Yes Historical Provider, MD  PEDIALYTE (PEDIALYTE) SOLN Take 240 mLs by mouth daily as needed (Diarrhea).    Yes Historical Provider, MD  acetaminophen (TYLENOL) 500 MG tablet Take 1,000 mg by mouth every 6 (six) hours as needed. pain     Historical Provider, MD  cyclobenzaprine (FLEXERIL) 10 MG tablet Take 10 mg by mouth 3 (three) times daily as needed for muscle spasms.    Historical Provider, MD  HYDROcodone-acetaminophen (NORCO/VICODIN) 5-325 MG tablet Take 2 tablets by mouth every 4 (four) hours as needed. 08/18/15   [place] Lions, PA-C  ibuprofen (ADVIL,MOTRIN) 200 MG tablet Take 200 mg by mouth every 6 (six) hours as needed for pain. Pain    Historical Provider, MD  meloxicam (MOBIC) 7.5 MG tablet Take 7.5 mg by mouth daily as needed for pain.    Historical Provider, MD  ondansetron (ZOFRAN) 4 MG tablet Take 1 tablet (4 mg total) by mouth every 6 (six) hours. 08/18/15   [place] Lions, PA-C   BP 112/75 mmHg  Pulse 76  Temp(Src) 98 F (36.7 C) (Oral)  Resp 16  Ht 5\' 4"  (1.626 m)  Wt 132 lb (59.875 kg)  BMI 22.65 kg/m2  SpO2 99%  LMP 07/29/2015 Physical Exam  Constitutional: She appears well-developed and well-nourished. No distress.  HENT:  Head: Normocephalic and atraumatic.  Mouth/Throat: Oropharynx is clear and moist. No oropharyngeal exudate.  Eyes: Conjunctivae are normal. Pupils are equal, round, and reactive to light. Right eye exhibits no discharge. Left eye exhibits no discharge. No scleral icterus.  Neck: No tracheal deviation present.  Cardiovascular: Normal rate, regular rhythm, normal heart sounds and intact distal pulses.  Exam reveals no gallop and no friction rub.   No murmur heard. Pulmonary/Chest: Effort normal and breath sounds normal. No respiratory distress. She  has no wheezes. She has no rales. She exhibits no tenderness.  Abdominal: Soft. Bowel sounds are normal. She exhibits no distension and no mass. There is tenderness. There is rebound. There is no guarding.  TTP epigastrium, LLQ and RLQ.  Musculoskeletal: Normal range of motion. She exhibits no edema.  Lymphadenopathy:    She has no cervical adenopathy.  Neurological: She is alert. Coordination normal.  Skin: Skin is warm and dry. No rash noted. She is not diaphoretic. No erythema.  Psychiatric: She has a normal mood and affect. Her behavior is normal.  Nursing note and vitals reviewed.   ED Course  Procedures  Labs Review Labs Reviewed  URINALYSIS, ROUTINE W REFLEX MICROSCOPIC (NOT AT [facility]) - Abnormal; Notable for the following:    Color, Urine AMBER (*)    APPearance CLOUDY (*)    Bilirubin Urine SMALL (*)    Ketones, ur >80 (*)    All other components within normal limits  LIPASE, BLOOD  COMPREHENSIVE METABOLIC PANEL  CBC  POC URINE PREG, ED    Imaging Review Ct Abdomen Pelvis W Contrast  08/18/2015  CLINICAL DATA:  Right lower quadrant abdominal pain and nausea. EXAM: CT ABDOMEN AND PELVIS WITH CONTRAST TECHNIQUE: Multidetector CT imaging of the abdomen and pelvis was performed using the standard protocol following bolus administration of intravenous contrast. CONTRAST:  100 cc Omnipaque 350 IV. COMPARISON:  12/16/2012 CT abdomen/ pelvis. FINDINGS: Lower chest: No significant pulmonary nodules or acute consolidative airspace disease. Hypoventilatory changes in the dependent lung bases. Hepatobiliary: Normal liver with no liver mass. Normal gallbladder with no radiopaque cholelithiasis. No biliary ductal dilatation. Pancreas: Normal, with no mass or duct dilation. Spleen: Normal size. No mass. Adrenals/Urinary Tract: Normal adrenals. Normal kidneys with no hydronephrosis and no renal mass. Normal bladder. Stomach/Bowel: Grossly normal stomach. There is circumferential wall thickening,  wall hyper enhancement and surrounding fat stranding and ill-defined mesenteric fluid involving a long apparently uninterrupted segment of distal and terminal ileum in the right lower quadrant of the abdomen. Normal appendix. There is mild wall thickening, hyperenhancement and associated mild pericolonic fat stranding involving the cecum, ascending colon, hepatic flexure of the colon and transverse colon. The splenic flexure of the colon, descending colon, sigmoid colon and rectum appear normal. Vascular/Lymphatic: Normal caliber abdominal aorta. Patent portal, splenic, hepatic and renal veins. No pathologically enlarged lymph nodes in the abdomen or pelvis. Reproductive: Grossly normal uterus. There are bilateral mildly rim enhancing ovarian cysts measuring 4.3 cm on the left and 1.6 cm on the right. Other: No pneumoperitoneum.  Small volume simple pelvic ascites. Musculoskeletal: No aggressive appearing focal osseous lesions. IMPRESSION: 1. Nonspecific infectious or inflammatory enterocolitis extending from the ileum to the distal transverse colon. No evidence of bowel  obstruction, perforation or abscess. Normal appendix. 2. Bilateral ovarian cysts with thin rim enhancement, largest 4.3 cm in the left ovary, likely physiologic. No further follow-up is required unless as otherwise clinically warranted. This recommendation follows ACR consensus guidelines: White Paper of the ACR Incidental Findings Committee II on Adnexal Findings. J Am Coll Radiol 2013:10:675-681. 3. Small volume simple pelvic ascites. Electronically Signed   By: Ilona Sorrel M.D.   On: 08/18/2015 19:38   I have personally reviewed and evaluated these images and lab results as part of my medical decision-making.  MDM   Final diagnoses:  Generalized abdominal pain   Epigastric pain that radiates to lower abdomen. Will work-up for appendicitis, pancreatitis, UTI, STI, TOA, ectopic. Less likely obstruction (no history abdominal surgery),  mesenteric ischemia, ruptured AAA (no risk factors), ovarian torsion (BL pain).  Patient non-toxic appearing on exam, VSS. Discussed work-up plan with patient, and she refused a pelvic exam. Explained certain etiologies may be missed, and she understood. Will give dilaudid and zofran and monitor pain level.   Upon re-evaluation, patient feeling much better.  CT scan shows enterocolitis and BL rim-enhancing ovarian cysts (most likely physiologic process). Normal white count.   Discussed results of work-up thus far with patient, and offered pelvic again. She states she will follow-up with her PCP. Discussed reasons for return, and will prescribe zofran and vicodin. Patient may be safely discharged home. Her mother will drive her home. Patient and mother in understanding and agreement with plan.    [place] Lions, PA-C 08/18/15 2104  Leo Grosser, MD 08/23/15 [id number]

## 2015-08-18 NOTE — Discharge Instructions (Signed)
Ms. Sedlack and Family,  Nice meeting you! Your CT scan showed bilateral ovarian cysts and enterocolitis. I am giving you zofran and vicodin for your nausea and pain. Return to the emergency department if you develop fevers, cannot keep any fluids/food down, or have worsening pain. Drink plenty of fluids, rest, and eat a bland diet for a day or two. Please follow-up with your primary care provider within a week. I hope you feel better soon!  S. Wendie Simmer, PA-C

## 2015-08-18 NOTE — ED Notes (Signed)
Pt left at this time with all belongings.  

## 2015-09-02 ENCOUNTER — Encounter: Payer: Self-pay | Admitting: Obstetrics and Gynecology

## 2015-09-02 ENCOUNTER — Telehealth: Payer: Self-pay | Admitting: *Deleted

## 2015-09-02 ENCOUNTER — Ambulatory Visit (INDEPENDENT_AMBULATORY_CARE_PROVIDER_SITE_OTHER): Payer: BLUE CROSS/BLUE SHIELD | Admitting: Obstetrics and Gynecology

## 2015-09-02 VITALS — BP 112/60 | HR 80 | Resp 18 | Ht 63.5 in | Wt 131.0 lb

## 2015-09-02 DIAGNOSIS — Z01419 Encounter for gynecological examination (general) (routine) without abnormal findings: Secondary | ICD-10-CM

## 2015-09-02 DIAGNOSIS — N83202 Unspecified ovarian cyst, left side: Secondary | ICD-10-CM

## 2015-09-02 DIAGNOSIS — Z803 Family history of malignant neoplasm of breast: Secondary | ICD-10-CM

## 2015-09-02 DIAGNOSIS — Z9889 Other specified postprocedural states: Secondary | ICD-10-CM

## 2015-09-02 DIAGNOSIS — Z124 Encounter for screening for malignant neoplasm of cervix: Secondary | ICD-10-CM | POA: Diagnosis not present

## 2015-09-02 NOTE — Patient Instructions (Signed)
Check with your Mother about BRCA testing for breast cancer. If not you may want to consider genetic consult.   EXERCISE AND DIET:  We recommended that you start or continue a regular exercise program for good health. Regular exercise means any activity that makes your heart beat faster and makes you sweat.  We recommend exercising at least 30 minutes per day at least 3 days a week, preferably 4 or 5.  We also recommend a diet low in fat and sugar.  Inactivity, poor dietary choices and obesity can cause diabetes, heart attack, stroke, and kidney damage, among others.    ALCOHOL AND SMOKING:  Women should limit their alcohol intake to no more than 7 drinks/beers/glasses of wine (combined, not each!) per week. Moderation of alcohol intake to this level decreases your risk of breast cancer and liver damage. And of course, no recreational drugs are part of a healthy lifestyle.  And absolutely no smoking or even second hand smoke. Most people know smoking can cause heart and lung diseases, but did you know it also contributes to weakening of your bones? Aging of your skin?  Yellowing of your teeth and nails?  CALCIUM AND VITAMIN D:  Adequate intake of calcium and Vitamin D are recommended.  The recommendations for exact amounts of these supplements seem to change often, but generally speaking 600 mg of calcium (either carbonate or citrate) and 800 units of Vitamin D per day seems prudent. Certain women may benefit from higher intake of Vitamin D.  If you are among these women, your doctor will have told you during your visit.    PAP SMEARS:  Pap smears, to check for cervical cancer or precancers,  have traditionally been done yearly, although recent scientific advances have shown that most women can have pap smears less often.  However, every woman still should have a physical exam from her gynecologist every year. It will include a breast check, inspection of the vulva and vagina to check for abnormal growths or  skin changes, a visual exam of the cervix, and then an exam to evaluate the size and shape of the uterus and ovaries.  And after 32 years of age, a rectal exam is indicated to check for rectal cancers. We will also provide age appropriate advice regarding health maintenance, like when you should have certain vaccines, screening for sexually transmitted diseases, bone density testing, colonoscopy, mammograms, etc.   MAMMOGRAMS:  All women over 6 years old should have a yearly mammogram. Many facilities now offer a "3D" mammogram, which may cost around $50 extra out of pocket. If possible,  we recommend you accept the option to have the 3D mammogram performed.  It both reduces the number of women who will be called back for extra views which then turn out to be normal, and it is better than the routine mammogram at detecting truly abnormal areas.    COLONOSCOPY:  Colonoscopy to screen for colon cancer is recommended for all women at age 32.  We know, you hate the idea of the prep.  We agree, BUT, having colon cancer and not knowing it is worse!!  Colon cancer so often starts as a polyp that can be seen and removed at colonscopy, which can quite literally save your life!  And if your first colonoscopy is normal and you have no family history of colon cancer, most women don't have to have it again for 10 years.  Once every ten years, you can do something that may  end up saving your life, right?  We will be happy to help you get it scheduled when you are ready.  Be sure to check your insurance coverage so you understand how much it will cost.  It may be covered as a preventative service at no cost, but you should check your particular policy.

## 2015-09-02 NOTE — Progress Notes (Signed)
Patient ID: Donna Mcdowell, female   DOB: [birth year], 34 y.o.   MRN: [id number] 34 y.o. G0P0 MarriedCaucasianF here for annual exam.  She had a CT earlier this month for abdominal pain and nausea, ultimately felt to be viral. She was noted to have bilateral ovarian cysts, the left ovary had a 4.3 cm cyst. Menses are normal, saturates a super tampon in 2-3 hours. No BTB. Sexually active, female partner, married, together x years. No kids. They are planning a pregnancy with Nishika's eggs and her partner will carry the pregnancy.   Period Cycle (Days): 28 Period Duration (Days): 5-7 days  Period Pattern: Regular Menstrual Flow: Moderate Menstrual Control: Tampon Dysmenorrhea: None  Patient's last menstrual period was 09/01/2015.          Sexually active: Yes.    The current method of family planning is None.    Exercising: No.  The patient does not participate in regular exercise at present. Smoker:  yes  Health Maintenance: Pap:  01-05-14 WNL NEG HR HPV History of abnormal Pap:  Yes- had LEEP for CIN II-III MMG:  Never Colonoscopy:  Never BMD:   Never TDaP:  09-04-14  Gardasil: unsure   reports that she has been smoking Cigarettes.  She has been smoking about 1.00 pack per day. She has never used smokeless tobacco. She reports that she drinks about 6.0 oz of alcohol per week. She reports that she does not use illicit drugs. Planning to quit. She has a chantix script, may start that.   Past Medical History  Diagnosis Date  . Breast pain in female     breast abscess  . Fatigue   . Wears glasses     wears contacts   . 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 Ovarian cyst     Past Surgical History  Procedure Laterality Date  . Hand surgery    . Breast surgery Left     breast abscess, history of peircing on that breast  . Cervical biopsy  w/ loop electrode excision  02/2013    CIN II-III    Current Outpatient Prescriptions  Medication Sig Dispense Refill  .  acetaminophen (TYLENOL) 500 MG tablet Take 1,000 mg by mouth every 6 (six) hours as needed. pain     . Aspirin-Acetaminophen (GOODYS BODY PAIN PO) Take 1 packet by mouth daily as needed (Back pain).     . cyclobenzaprine (FLEXERIL) 10 MG tablet Take 10 mg by mouth 3 (three) times daily as needed for muscle spasms.    Marland Kitchen HYDROcodone-acetaminophen (NORCO/VICODIN) 5-325 MG tablet Take 2 tablets by mouth every 4 (four) hours as needed. 10 tablet 0  . ibuprofen (ADVIL,MOTRIN) 200 MG tablet Take 200 mg by mouth every 6 (six) hours as needed for pain. Pain    . meloxicam (MOBIC) 7.5 MG tablet Take 7.5 mg by mouth daily as needed for pain.    Marland Kitchen ondansetron (ZOFRAN) 4 MG tablet Take 1 tablet (4 mg total) by mouth every 6 (six) hours. 16 tablet 0  . PEDIALYTE (PEDIALYTE) SOLN Take 240 mLs by mouth daily as needed (Diarrhea).      No current facility-administered medications for this visit.    Family History  Problem Relation Age of Onset  . Breast cancer Mother 3  . Cancer Mother   . Osteoarthritis Maternal Grandmother   . Cancer Maternal Grandmother   . Osteoarthritis Maternal Grandfather   . Heart failure Maternal Grandfather    Mother with  breast cancer at 17, still living currently 72. Unsure about BRCA testing.   Review of Systems  Constitutional: Negative.   HENT: Negative.   Eyes: Negative.   Respiratory: Negative.   Cardiovascular: Negative.   Gastrointestinal: Negative.   Endocrine: Negative.   Genitourinary: Negative.   Musculoskeletal: Negative.   Skin: Negative.   Allergic/Immunologic: Negative.   Neurological: Negative.   Psychiatric/Behavioral: Negative.     Exam:   BP 112/60 mmHg  Pulse 80  Resp 18  Ht 5' 3.5" (1.613 m)  Wt 131 lb (59.421 kg)  BMI 22.84 kg/m2  LMP 09/01/2015  Weight change: _0 @ Height:   Height: 5' 3.5" (161.3 cm)  Ht Readings from Last 3 Encounters:  09/02/15 5' 3.5" (1.613 m)  08/18/15 _1  (1.626 m)  09/04/14 _2  (1.626 m)     General appearance: alert, cooperative and appears stated age Head: Normocephalic, without obvious abnormality, atraumatic Neck: no adenopathy, supple, symmetrical, trachea midline and thyroid normal to inspection and palpation Lungs: clear to auscultation bilaterally Breasts: normal appearance, no masses or tenderness Heart: regular rate and rhythm Abdomen: soft, non-tender; bowel sounds normal; no masses,  no organomegaly Extremities: extremities normal, atraumatic, no cyanosis or edema Skin: Skin color, texture, turgor normal. No rashes or lesions Lymph nodes: Cervical, supraclavicular, and axillary nodes normal. No abnormal inguinal nodes palpated Neurologic: Grossly normal   Pelvic: External genitalia:  no lesions              Urethra:  normal appearing urethra with no masses, tenderness or lesions              Bartholins and Skenes: normal                 Vagina: normal appearing vagina with normal color and discharge, no lesions              Cervix: no lesions               Bimanual Exam:  Uterus:  normal size, contour, position, consistency, mobility, non-tender              Adnexa: no mass, fullness, tenderness               Rectovaginal: Confirms               Anus:  normal sphincter tone, no lesions  Chaperone was present for exam.  A:  Well Woman with normal exam  H/O LEEP  Mom with breast cancer at 59  Left ovarian cyst  P:   Pap with hpv  Discussed breast self exam  Will need to start mammograms at 67  Discussed BRCA testing for her mother or genetic counseling  F/U for an ultrasound

## 2015-09-02 NOTE — Addendum Note (Signed)
Addended by: Dorothy Spark on: 09/02/2015 03:47 PM   Modules accepted: Orders

## 2015-09-02 NOTE — Telephone Encounter (Signed)
I spoke with patient and let her know that it will be the end of December before the U/S will be done- due to giving the cyst time to resolve - she voiced understanding- eh

## 2015-09-02 NOTE — Telephone Encounter (Signed)
-----   Message from Salvadore Dom, MD sent at 09/02/2015  2:10 PM EST ----- Will you please let her know that the u/s won't be until the end of December. That gives the cyst time to resolve.  Thanks!!

## 2015-09-04 LAB — IPS PAP TEST WITH HPV

## 2015-09-11 ENCOUNTER — Telehealth: Payer: Self-pay | Admitting: Obstetrics and Gynecology

## 2015-09-11 NOTE — Telephone Encounter (Signed)
Called patient to review benefits for procedure. Left voicemail to call back and review. °

## 2015-09-16 NOTE — Telephone Encounter (Signed)
Spoke with patient regarding benefit for pelvic ultrasound. Patient understands and agreeable. Patient ready to schedule. Patient scheduled 09/24/15 @3pm  with Dr Talbert Nan. Patient agreeable to arrival date/time. Patient agreeable to 72 hour cancellation policy with [id number] fee. No further questions. Ok to close.

## 2015-09-16 NOTE — Telephone Encounter (Signed)
2nd call to patient to review benefits for procedure and schedule. Left voicemail.

## 2015-09-24 ENCOUNTER — Ambulatory Visit (INDEPENDENT_AMBULATORY_CARE_PROVIDER_SITE_OTHER): Payer: BLUE CROSS/BLUE SHIELD | Admitting: Obstetrics and Gynecology

## 2015-09-24 ENCOUNTER — Encounter: Payer: Self-pay | Admitting: Obstetrics and Gynecology

## 2015-09-24 ENCOUNTER — Ambulatory Visit (INDEPENDENT_AMBULATORY_CARE_PROVIDER_SITE_OTHER): Payer: BLUE CROSS/BLUE SHIELD

## 2015-09-24 VITALS — BP 108/68 | HR 80 | Resp 16 | Wt 130.0 lb

## 2015-09-24 DIAGNOSIS — N83209 Unspecified ovarian cyst, unspecified side: Secondary | ICD-10-CM | POA: Diagnosis not present

## 2015-09-24 DIAGNOSIS — N83202 Unspecified ovarian cyst, left side: Secondary | ICD-10-CM

## 2015-09-24 NOTE — Progress Notes (Signed)
GYNECOLOGY  VISIT   HPI: 34 y.o.   Married  Caucasian  female   G0P0 with Patient's last menstrual period was 09/21/2015.   here for     GYNECOLOGIC HISTORY: Patient's last menstrual period was 09/21/2015. Contraception:None Menopausal hormone therapy: N/A        OB History    Gravida Para Term Preterm AB TAB SAB Ectopic Multiple Living   0                  Patient Active Problem List   Diagnosis Date Noted  . Severe cervical dysplasia   . Routine gynecological examination 01/03/2013  . Breast infection, left 03/17/2012    Past Medical History  Diagnosis Date  . Breast pain in female     breast abscess  . Fatigue   . Wears glasses     wears contacts   . 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 Ovarian cyst     Past Surgical History  Procedure Laterality Date  . Hand surgery    . Breast surgery Left     breast abscess, history of peircing on that breast  . Cervical biopsy  w/ loop electrode excision  02/2013    CIN II-III    Current Outpatient Prescriptions  Medication Sig Dispense Refill  . acetaminophen (TYLENOL) 500 MG tablet Take 1,000 mg by mouth every 6 (six) hours as needed. pain     . Aspirin-Acetaminophen (GOODYS BODY PAIN PO) Take 1 packet by mouth daily as needed (Back pain).     . cyclobenzaprine (FLEXERIL) 10 MG tablet Take 10 mg by mouth 3 (three) times daily as needed for muscle spasms.    Marland Kitchen HYDROcodone-acetaminophen (NORCO/VICODIN) 5-325 MG tablet Take 2 tablets by mouth every 4 (four) hours as needed. 10 tablet 0  . ibuprofen (ADVIL,MOTRIN) 200 MG tablet Take 200 mg by mouth every 6 (six) hours as needed for pain. Pain    . meloxicam (MOBIC) 7.5 MG tablet Take 7.5 mg by mouth daily as needed for pain.    Marland Kitchen ondansetron (ZOFRAN) 4 MG tablet Take 1 tablet (4 mg total) by mouth every 6 (six) hours. 16 tablet 0  . PEDIALYTE (PEDIALYTE) SOLN Take 240 mLs by mouth daily as needed (Diarrhea).      No current facility-administered  medications for this visit.     ALLERGIES: Oxycodone base  Family History  Problem Relation Age of Onset  . Breast cancer Mother 37  . Cancer Mother   . Osteoarthritis Maternal Grandmother   . Cancer Maternal Grandmother   . Osteoarthritis Maternal Grandfather   . Heart failure Maternal Grandfather     Social History   Social History  . Marital Status: Married    Spouse Name: N/A  . Number of Children: N/A  . Years of Education: N/A   Occupational History  . Not on file.   Social History Main Topics  . Smoking status: Current Every Day Smoker -- 1.00 packs/day    Types: Cigarettes  . Smokeless tobacco: Never Used  . Alcohol Use: 6.0 oz/week    10 Standard drinks or equivalent per week  . Drug Use: No  . Sexual Activity:    Partners: Female    Museum/gallery curator: None     Comment: Female Partner   Other Topics Concern  . Not on file   Social History Narrative    Review of Systems  Genitourinary:  Abdominal pain Irregular cycles    PHYSICAL EXAMINATION:    BP 108/68 mmHg  Pulse 80  Resp 16  Wt 130 lb (58.968 kg)  LMP 09/21/2015    General appearance: alert, cooperative and appears stated age  Ultrasound pictures reviewed with the patient  ASSESSMENT Ovarian cysts, resolved  PLAN Routine f/u   An After Visit Summary was printed and given to the patient.  ______ minutes face to face time of which over 50% was spent in counseling.

## 2016-04-28 ENCOUNTER — Encounter (HOSPITAL_COMMUNITY): Payer: Self-pay | Admitting: Emergency Medicine

## 2016-04-28 ENCOUNTER — Emergency Department (HOSPITAL_COMMUNITY): Payer: BLUE CROSS/BLUE SHIELD

## 2016-04-28 ENCOUNTER — Emergency Department (HOSPITAL_COMMUNITY)
Admission: EM | Admit: 2016-04-28 | Discharge: 2016-04-28 | Disposition: A | Discharge status: Home / Self Care | Payer: BLUE CROSS/BLUE SHIELD | Attending: Emergency Medicine | Admitting: Emergency Medicine

## 2016-04-28 DIAGNOSIS — F17210 Nicotine dependence, cigarettes, uncomplicated: Secondary | ICD-10-CM | POA: Diagnosis not present

## 2016-04-28 DIAGNOSIS — R002 Palpitations: Secondary | ICD-10-CM | POA: Diagnosis not present

## 2016-04-28 DIAGNOSIS — Z7982 Long term (current) use of aspirin: Secondary | ICD-10-CM | POA: Diagnosis not present

## 2016-04-28 DIAGNOSIS — Z79899 Other long term (current) drug therapy: Secondary | ICD-10-CM | POA: Diagnosis not present

## 2016-04-28 DIAGNOSIS — R0602 Shortness of breath: Secondary | ICD-10-CM | POA: Diagnosis not present

## 2016-04-28 LAB — BASIC METABOLIC PANEL
Anion gap: 8 (ref 5–15)
BUN: 9 mg/dL (ref 6–20)
CALCIUM: 9.1 mg/dL (ref 8.9–10.3)
CHLORIDE: 106 mmol/L (ref 101–111)
CO2: 25 mmol/L (ref 22–32)
Creatinine, Ser: 0.63 mg/dL (ref 0.44–1.00)
Glucose, Bld: 98 mg/dL (ref 65–99)
POTASSIUM: 3.6 mmol/L (ref 3.5–5.1)
SODIUM: 139 mmol/L (ref 135–145)

## 2016-04-28 LAB — I-STAT TROPONIN, ED: TROPONIN I, POC: 0 ng/mL (ref 0.00–0.08)

## 2016-04-28 LAB — CBC
HCT: 40.2 % (ref 36.0–46.0)
HEMOGLOBIN: 13.6 g/dL (ref 12.0–15.0)
MCH: 32.2 pg (ref 26.0–34.0)
MCHC: 33.8 g/dL (ref 30.0–36.0)
MCV: 95 fL (ref 78.0–100.0)
Platelets: 160 10*3/uL (ref 150–400)
RBC: 4.23 MIL/uL (ref 3.87–5.11)
RDW: 13.3 % (ref 11.5–15.5)
WBC: 7.8 10*3/uL (ref 4.0–10.5)

## 2016-04-28 MED ORDER — SODIUM CHLORIDE 0.9 % IV BOLUS (SEPSIS)
1000.0000 mL | Freq: Once | INTRAVENOUS | Status: AC
  Administered 2016-04-28: 1000 mL via INTRAVENOUS

## 2016-04-28 MED ORDER — LORAZEPAM 2 MG/ML IJ SOLN
2.0000 mg | Freq: Once | INTRAMUSCULAR | Status: AC
  Administered 2016-04-28: 2 mg via INTRAVENOUS
  Filled 2016-04-28: qty 1

## 2016-04-28 MED ORDER — LORAZEPAM 0.5 MG PO TABS: 1.0000 mg | ORAL_TABLET | Freq: Two times a day (BID) | ORAL | Status: DC | PRN

## 2016-04-28 NOTE — ED Notes (Signed)
Patient presents for palpitations and SOB. Denies fever, N/V, dizziness.

## 2016-04-28 NOTE — ED Provider Notes (Signed)
CSN: [id number]     Arrival date & time 04/28/16  1342 History   First MD Initiated Contact with Patient 04/28/16 1408     Chief Complaint  Patient presents with  . Palpitations  . Shortness of Breath   HPI  Donna Mcdowell is a 35 y.o. female presenting with a 3 hour history of shortness of breath and palpitations. She states her shortness of breath was nonexertional. She endorses a similar episode years ago and she was diagnosed with a panic attack. She endorses a chronic nonproductive cough without hemoptysis. She does not take any medicines for anxiety. She endorses being stressed out from work and home. She denies fevers, chills, chest pain, abdominal pain, nausea, vomiting, dizziness, unilateral leg swelling/discolorations, recent surgeries, exogenous estrogen use.  Past Medical History  Diagnosis Date  . Breast pain in female     breast abscess  . Fatigue   . Wears glasses     wears contacts   . 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 Ovarian cyst    Past Surgical History  Procedure Laterality Date  . Hand surgery    . Breast surgery Left     breast abscess, history of peircing on that breast  . Cervical biopsy  w/ loop electrode excision  02/2013    CIN II-III   Family History  Problem Relation Age of Onset  . Breast cancer Mother 67  . Cancer Mother   . Osteoarthritis Maternal Grandmother   . Cancer Maternal Grandmother   . Osteoarthritis Maternal Grandfather   . Heart failure Maternal Grandfather    Social History  Substance Use Topics  . Smoking status: Current Every Day Smoker -- 1.00 packs/day    Types: Cigarettes  . Smokeless tobacco: Never Used  . Alcohol Use: 6.0 oz/week    10 Standard drinks or equivalent per week   OB History    Gravida Para Term Preterm AB TAB SAB Ectopic Multiple Living   0              Review of Systems  Ten systems are reviewed and are negative for acute change except as noted in the HPI  Allergies  Oxycodone  base  Home Medications   Prior to Admission medications   Medication Sig Start Date End Date Taking? Authorizing Provider  Aspirin-Acetaminophen (GOODYS BODY PAIN PO) Take 1 packet by mouth 2 (two) times daily as needed (Back pain).    Yes Historical Provider, MD  Aspirin-Salicylamide-Caffeine (BC HEADACHE PO) Take 1 packet by mouth 2 (two) times daily as needed (pain).   Yes Historical Provider, MD  meloxicam (MOBIC) 7.5 MG tablet Take 7.5 mg by mouth daily as needed for pain.   Yes Historical Provider, MD   BP 126/74 mmHg  Pulse 95  Temp(Src) 97 F (36.1 C) (Oral)  Resp 16  SpO2 98%  LMP 04/07/2016 (Approximate) Physical Exam  Constitutional: She appears well-developed and well-nourished. No distress.  HENT:  Head: Normocephalic and atraumatic.  Mouth/Throat: Oropharynx is clear and moist. No oropharyngeal exudate.  Eyes: Conjunctivae are normal. Pupils are equal, round, and reactive to light. Right eye exhibits no discharge. Left eye exhibits no discharge. No scleral icterus.  Neck: No tracheal deviation present.  Cardiovascular: Normal rate, regular rhythm, normal heart sounds and intact distal pulses.  Exam reveals no gallop and no friction rub.   No murmur heard. Pulmonary/Chest: Effort normal and breath sounds normal. No respiratory distress. She has no wheezes. She  has no rales. She exhibits no tenderness.  Abdominal: Soft. Bowel sounds are normal. She exhibits no distension and no mass. There is no tenderness. There is no rebound and no guarding.  Musculoskeletal: She exhibits no edema.  Lymphadenopathy:    She has no cervical adenopathy.  Neurological: She is alert. Coordination normal.  Skin: Skin is warm and dry. No rash noted. She is not diaphoretic. No erythema.  Psychiatric:  Tearful, anxious  Nursing note and vitals reviewed.   ED Course  Procedures  Labs Review Labs Reviewed  CBC  BASIC METABOLIC PANEL  Randolm Idol, ED    Imaging Review Dg Chest 2  View  04/28/2016  CLINICAL DATA:  Cardiac palpitations and shortness of breath EXAM: CHEST  2 VIEW COMPARISON:  January 07, 2007 FINDINGS: Lungs are clear. Heart size and pulmonary vascularity are normal. No pneumothorax. No adenopathy. No bone lesions. IMPRESSION: No abnormality noted. Electronically Signed   By: Lowella Grip III M.D.   On: 04/28/2016 14:03   I have personally reviewed and evaluated these images and lab results as part of my medical decision-making.   EKG Interpretation   Date/Time:  Tuesday April 28 2016 13:51:07 EDT Ventricular Rate:  105 PR Interval:    QRS Duration: 87 QT Interval:  334 QTC Calculation: 442 R Axis:   55 Text Interpretation:  Sinus tachycardia `t inv inferiorly, and v3-v6,  changed from prior Confirmed by Ashok Cordia  MD, Lennette Bihari ([id number]) on 04/28/2016  2:09:54 PM      MDM   Final diagnoses:  Palpitations  Shortness of breath   Patient nontoxic-appearing, vital signs stable. EKG demonstrates T wave inversions from prior. CBC, BMP unremarkable.  Patient is to be discharged with recommendation to follow up with PCP in regards to today's hospital visit. Chest pain is not likely of cardiac or pulmonary etiology d/t presentation, PERC negative, VSS, no tracheal deviation, no JVD or new murmur, RRR, breath sounds equal bilaterally, negative troponin, and negative CXR. Pt has been advised to return to the ED if CP becomes exertional, associated with diaphoresis or nausea, radiates to left jaw/arm, worsens or becomes concerning in any way. Pt appears reliable for follow up and is agreeable to discharge.  Due to EKG changes, patient referred to cardiology. Patient may be safely discharged home with a few days of ativan, as this improved her symptoms. Discussed reasons for return. Patient to follow-up with primary care provider and cardiology. Patient in understanding and agreement with the plan.  Byron Lions, PA-C 04/28/16 [place],  MD 04/28/16 [phone]

## 2016-04-28 NOTE — Discharge Instructions (Signed)
Ms. Donna Mcdowell,  Nice meeting you! Please follow-up with cardiology and your primary care provider. Return to the emergency department if you develop increased shortness of breath, chest pain, nausea/vomiting, new/worsening symptoms. Feel better soon!  S. Wendie Simmer, PA-C

## 2016-04-29 NOTE — ED Notes (Signed)
Questions about discharge

## 2016-05-04 ENCOUNTER — Encounter (HOSPITAL_COMMUNITY): Payer: Self-pay | Admitting: Emergency Medicine

## 2016-05-04 ENCOUNTER — Ambulatory Visit (HOSPITAL_COMMUNITY)
Admission: EM | Admit: 2016-05-04 | Discharge: 2016-05-04 | Disposition: A | Discharge status: Home / Self Care | Payer: BLUE CROSS/BLUE SHIELD | Attending: Emergency Medicine | Admitting: Emergency Medicine

## 2016-05-04 DIAGNOSIS — R0989 Other specified symptoms and signs involving the circulatory and respiratory systems: Secondary | ICD-10-CM

## 2016-05-04 DIAGNOSIS — F458 Other somatoform disorders: Secondary | ICD-10-CM | POA: Diagnosis not present

## 2016-05-04 MED ORDER — LIDOCAINE VISCOUS 2 % MT SOLN
5.0000 mL | OROMUCOSAL | 0 refills | Status: DC | PRN
Start: 2016-05-04 — End: 2016-09-02

## 2016-05-04 NOTE — ED Provider Notes (Signed)
[place]    CSN: [id number] Arrival date & time: 05/04/16  1528  First Provider Contact:  First MD Initiated Contact with Patient 05/04/16 1624        History   Chief Complaint Chief Complaint  Patient presents with  . Sore Throat    HPI EDELYNN HYDER is a 35 y.o. female.   She is a 35 year old woman here for evaluation of globus sensation. She states for about a week she has had the sensation of something in her throat.  She denies any pain or difficulty swallowing. She states she is constantly aware of it and it is very aggravating. She also feels like something twitches in her throat periodically. She reports some typical allergy symptoms including a little bit of drainage, but nothing too bad. She is not currently taking any allergy medicine.      Past Medical History:  Diagnosis Date  . Breast pain in female    breast abscess  . Fatigue   . Ovarian cyst   . 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 Wears glasses    wears contacts     Patient Active Problem List   Diagnosis Date Noted  . Severe cervical dysplasia   . Routine gynecological examination 01/03/2013  . Breast infection, left 03/17/2012    Past Surgical History:  Procedure Laterality Date  . BREAST SURGERY Left    breast abscess, history of peircing on that breast  . CERVICAL BIOPSY  W/ LOOP ELECTRODE EXCISION  02/2013   CIN II-III  . HAND SURGERY      OB History    Gravida Para Term Preterm AB Living   0             SAB TAB Ectopic Multiple Live Births                   Home Medications    Prior to Admission medications   Medication Sig Start Date End Date Taking? Authorizing Provider  Aspirin-Acetaminophen (GOODYS BODY PAIN PO) Take 1 packet by mouth 2 (two) times daily as needed (Back pain).    Yes Historical Provider, MD  Aspirin-Salicylamide-Caffeine (BC HEADACHE PO) Take 1 packet by mouth 2 (two) times daily as needed (pain).   Yes Historical Provider,  MD  LORazepam (ATIVAN) 0.5 MG tablet Take 2 tablets (1 mg total) by mouth 2 (two) times daily as needed for anxiety. 04/28/16  Yes  Lions, PA-C  lidocaine (XYLOCAINE) 2 % solution Use as directed 5 mLs in the mouth or throat every 4 (four) hours as needed (throat discomfort). 05/04/16   Melony Overly, MD  meloxicam (MOBIC) 7.5 MG tablet Take 7.5 mg by mouth daily as needed for pain.    Historical Provider, MD    Family History Family History  Problem Relation Age of Onset  . Breast cancer Mother 49  . Cancer Mother   . Osteoarthritis Maternal Grandmother   . Cancer Maternal Grandmother   . Osteoarthritis Maternal Grandfather   . Heart failure Maternal Grandfather     Social History Social History  Substance Use Topics  . Smoking status: Current Every Day Smoker    Packs/day: 1.00    Types: Cigarettes  . Smokeless tobacco: Never Used  . Alcohol use 6.0 oz/week    10 Standard drinks or equivalent per week     Allergies   Oxycodone base   Review of Systems Review of Systems  Constitutional: Negative for  fever.  HENT: Positive for congestion. Negative for rhinorrhea, sneezing, sore throat and trouble swallowing.        Globus sensation  Respiratory: Negative for shortness of breath.      Physical Exam Triage Vital Signs ED Triage Vitals  Enc Vitals Group     BP      Pulse      Resp      Temp      Temp src      SpO2      Weight      Height      Head Circumference      Peak Flow      Pain Score      Pain Loc      Pain Edu?      Excl. in [place]?    No data found.   Updated Vital Signs BP 120/59 (BP Location: Left Arm)   Pulse 69   Temp 99.2 F (37.3 C) (Oral)   Resp 16   LMP 04/09/2016 (Approximate)   SpO2 100%   Visual Acuity Right Eye Distance:   Left Eye Distance:   Bilateral Distance:    Right Eye Near:   Left Eye Near:    Bilateral Near:     Physical Exam  Constitutional: She is oriented to person, place, and time. She appears  well-developed and well-nourished. No distress.  HENT:  Mouth/Throat: Oropharynx is clear and moist. No oropharyngeal exudate.  Very minimal clear postnasal drainage  Neck: Normal range of motion. Neck supple. No thyromegaly present.  Cardiovascular: Normal rate.   Pulmonary/Chest: Effort normal.  Lymphadenopathy:    She has no cervical adenopathy.  Neurological: She is alert and oriented to person, place, and time.     UC Treatments / Results  Labs (all labs ordered are listed, but only abnormal results are displayed) Labs Reviewed - No data to display  EKG  EKG Interpretation None       Radiology No results found.  Procedures Procedures (including critical care time)  Medications Ordered in UC Medications - No data to display   Initial Impression / Assessment and Plan / UC Course  I have reviewed the triage vital signs and the nursing notes.  Pertinent labs & imaging results that were available during my care of the patient were reviewed by me and considered in my medical decision making (see chart for details).  Clinical Course    Globus sensation may be due to allergic postnasal drainage for possible vocal cord nodule.  Recommended starting a daily allergy pill as she does have triggers for allergies in her home. Prescription for viscous lidocaine given to provide temporary numbing.  Follow-up with ENT if not improving in 1 week.  Final Clinical Impressions(s) / UC Diagnoses   Final diagnoses:  Globus sensation    New Prescriptions New Prescriptions   LIDOCAINE (XYLOCAINE) 2 % SOLUTION    Use as directed 5 mLs in the mouth or throat every 4 (four) hours as needed (throat discomfort).     Melony Overly, MD 05/04/16 [phone]

## 2016-05-04 NOTE — Discharge Instructions (Signed)
This could be coming from some allergies. It could also be a small nodule on a vocal cord. What I can see of your mouth and throat looks normal.  Start taking a daily allergy medicine such as Claritin or Zyrtec. Use the lidocaine every 4 hours. This will none your mouth and throat, which should resolve that sensation. At least temporarily. If this is not improving over the next week, please follow-up with Dr. Erik Obey in ENT.

## 2016-05-04 NOTE — ED Triage Notes (Signed)
Patient feels like something in throat, "something stuck in throat", "tickle " in throat.  Increased stuffiness in morning.  Patient seen in emergency department last week for anxiety and palpitations.

## 2016-05-13 DIAGNOSIS — D069 Carcinoma in situ of cervix, unspecified: Secondary | ICD-10-CM | POA: Insufficient documentation

## 2016-06-01 ENCOUNTER — Ambulatory Visit: Payer: BLUE CROSS/BLUE SHIELD | Admitting: Cardiovascular Disease

## 2016-09-02 ENCOUNTER — Ambulatory Visit (INDEPENDENT_AMBULATORY_CARE_PROVIDER_SITE_OTHER): Payer: BLUE CROSS/BLUE SHIELD | Admitting: Obstetrics and Gynecology

## 2016-09-02 ENCOUNTER — Encounter: Payer: Self-pay | Admitting: Obstetrics and Gynecology

## 2016-09-02 VITALS — BP 102/72 | HR 84 | Resp 14 | Ht 63.5 in | Wt 129.0 lb

## 2016-09-02 DIAGNOSIS — Z0000 Encounter for general adult medical examination without abnormal findings: Secondary | ICD-10-CM

## 2016-09-02 DIAGNOSIS — Z01419 Encounter for gynecological examination (general) (routine) without abnormal findings: Secondary | ICD-10-CM | POA: Diagnosis not present

## 2016-09-02 NOTE — Progress Notes (Signed)
35 y.o. G0P0 MarriedCaucasianF here for annual exam.  Sexually active, married to female partner. Partner plans to get pregnant with Gab[employer]elle's eggs. She is trying to quit smoking first. She hasn't started to quit.  Period Cycle (Days): 28 Period Duration (Days): 3-5 days Period Pattern: Regular Menstrual Flow: Heavy, Moderate Menstrual Control: Tampon Menstrual Control Change Freq (Hours): changes tampon  6 times a day on heavy days  Dysmenorrhea: None  Patient's last menstrual period was 08/13/2016.          Sexually active: Yes.    The current method of family planning is none (female partner).    Exercising: Yes.    walking/kickboxing/ heavy work load Smoker:  yes  Health Maintenance: Pap:  09-02-15 WNL NEG HR HPV History of abnormal Pap:  Yes LEEP for CIN II-III in 5/14. Negative pap and negative hpv in 3/15 and 11/16. MMG:  Never TDaP:  09-04-14  Gardasil: unsure    reports that she has been smoking Cigarettes.  She has been smoking about 1.00 pack per day. She has never used smokeless tobacco. She reports that she drinks about 6.0 oz of alcohol per week . 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 Wife is a Corporate treasurer.   Past Medical History:  Diagnosis Date  . Breast pain in female    breast abscess  . Fatigue   . Ovarian cyst   . 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 Wears glasses    wears contacts     Past Surgical History:  Procedure Laterality Date  . BREAST SURGERY Left    breast abscess, history of peircing on that breast  . CERVICAL BIOPSY  W/ LOOP ELECTRODE EXCISION  02/2013   CIN II-III  . HAND SURGERY      Current Outpatient Prescriptions  Medication Sig Dispense Refill  . Aspirin-Acetaminophen (GOODYS BODY PAIN PO) Take 1 packet by mouth 2 (two) times daily as needed (Back pain).     . Aspirin-Salicylamide-Caffeine (BC HEADACHE PO) Take 1 packet by mouth 2 (two) times  daily as needed (pain).    . cetirizine (ZYRTEC) 10 MG tablet Take 10 mg by mouth daily.    Marland Kitchen LORazepam (ATIVAN) 0.5 MG tablet Take 2 tablets (1 mg total) by mouth 2 (two) times daily as needed for anxiety. 6 tablet 0  . meloxicam (MOBIC) 7.5 MG tablet Take 7.5 mg by mouth daily as needed for pain.     No current facility-administered medications for this visit.     Family History  Problem Relation Age of Onset  . Breast cancer Mother 49  . Cancer Mother   . Osteoarthritis Maternal Grandmother   . Cancer Maternal Grandmother   . Osteoarthritis Maternal Grandfather   . Heart failure Maternal 55   Mom is currently 69, she is discussing BRCA testing with her MD.  Review of Systems  Constitutional: Negative.   HENT: Negative.   Eyes: Negative.   Respiratory: Negative.   Cardiovascular: Negative.   Gastrointestinal: Negative.   Endocrine: Negative.   Genitourinary: Negative.   Musculoskeletal: Negative.   Skin: Negative.   Allergic/Immunologic: Negative.   Neurological: Negative.   Psychiatric/Behavioral: Negative.     Exam:   BP 102/72 (BP Location: Right Arm, Patient Position: Sitting, Cuff Size: Normal)   Pulse 84   Resp 14   Ht 5' 3.5" (1.613 m)   Wt 129 lb (58.5 kg)  LMP 08/13/2016   BMI 22.49 kg/m   Weight change: @WEIGHTCHANGE @ Height:   Height: 5' 3.5" (161.3 cm)  Ht Readings from Last 3 Encounters:  09/02/16 5' 3.5" (1.613 m)  09/02/15 5' 3.5" (1.613 m)  08/18/15 5' 4"  (1.626 m)    General appearance: alert, cooperative and appears stated age Head: Normocephalic, without obvious abnormality, atraumatic Neck: no adenopathy, supple, symmetrical, trachea midline and thyroid normal to inspection and palpation Lungs: clear to auscultation bilaterally Breasts: normal appearance, no masses or tenderness Heart: regular rate and rhythm Abdomen: soft, non-tender; bowel sounds normal; no masses,  no organomegaly Extremities: extremities normal, atraumatic,  no cyanosis or edema Skin: Skin color, texture, turgor normal. No rashes or lesions Lymph nodes: Cervical, supraclavicular, and axillary nodes normal. No abnormal inguinal nodes palpated Neurologic: Grossly normal   Pelvic: External genitalia:  no lesions              Urethra:  normal appearing urethra with no masses, tenderness or lesions              Bartholins and Skenes: normal                 Vagina: normal appearing vagina with normal color. Some increased white, creamy vaginal d/c (she denies symptoms)              Cervix: no lesions               Bimanual Exam:  Uterus:  normal size, contour, position, consistency, mobility, non-tender              Adnexa: no mass, fullness, tenderness               Rectovaginal: Confirms               Anus:  normal sphincter tone, no lesions  Chaperone was present for exam.  A:  Well Woman with normal exam  FH of breast cancer in her mom at 82  P:   No pap this year, will space to 3 year  Mammogram starting next year  Discussed breast self exam  Discussed calcium and vit D intake  Mom will likely get BRCA testing

## 2016-09-02 NOTE — Patient Instructions (Signed)
Breast Self-Awareness Introduction Breast self-awareness means being familiar with how your breasts look and feel. It involves checking your breasts regularly and reporting any changes to your health care provider. Practicing breast self-awareness is important. A change in your breasts can be a sign of a serious medical problem. Being familiar with how your breasts look and feel allows you to find any problems early, when treatment is more likely to be successful. All women should practice breast self-awareness, including women who have had breast implants. How to do a breast self-exam One way to learn what is normal for your breasts and whether your breasts are changing is to do a breast self-exam. To do a breast self-exam: Look for Changes  1. Remove all the clothing above your waist. 2. Stand in front of a mirror in a room with good lighting. 3. Put your hands on your hips. 4. Push your hands firmly downward. 5. Compare your breasts in the mirror. Look for differences between them (asymmetry), such as:  Differences in shape.  Differences in size.  Puckers, dips, and bumps in one breast and not the other. 6. Look at each breast for changes in your skin, such as:  Redness.  Scaly areas. 7. Look for changes in your nipples, such as:  Discharge.  Bleeding.  Dimpling.  Redness.  A change in position. Feel for Changes  Carefully feel your breasts for lumps and changes. It is best to do this while lying on your back on the floor and again while sitting or standing in the shower or tub with soapy water on your skin. Feel each breast in the following way:  Place the arm on the side of the breast you are examining above your head.  Feel your breast with the other hand.  Start in the nipple area and make  inch (2 cm) overlapping circles to feel your breast. Use the pads of your three middle fingers to do this. Apply light pressure, then medium pressure, then firm pressure. The light  pressure will allow you to feel the tissue closest to the skin. The medium pressure will allow you to feel the tissue that is a little deeper. The firm pressure will allow you to feel the tissue close to the ribs.  Continue the overlapping circles, moving downward over the breast until you feel your ribs below your breast.  Move one finger-width toward the center of the body. Continue to use the  inch (2 cm) overlapping circles to feel your breast as you move slowly up toward your collarbone.  Continue the up and down exam using all three pressures until you reach your armpit. Write Down What You Find  Write down what is normal for each breast and any changes that you find. Keep a written record with breast changes or normal findings for each breast. By writing this information down, you do not need to depend only on memory for size, tenderness, or location. Write down where you are in your menstrual cycle, if you are still menstruating. If you are having trouble noticing differences in your breasts, do not get discouraged. With time you will become more familiar with the variations in your breasts and more comfortable with the exam. How often should I examine my breasts? Examine your breasts every month. If you are breastfeeding, the best time to examine your breasts is after a feeding or after using a breast pump. If you menstruate, the best time to examine your breasts is 5-7 days after your  period is over. During your period, your breasts are lumpier, and it may be more difficult to notice changes. When should I see my health care provider? See your health care provider if you notice:  A change in shape or size of your breasts or nipples.  A change in the skin of your breast or nipples, such as a reddened or scaly area.  Unusual discharge from your nipples.  A lump or thick area that was not there before.  Pain in your breasts.  Anything that concerns you. This information is not  intended to replace advice given to you by your health care provider. Make sure you discuss any questions you have with your health care provider. Document Released: 09/28/2005 Document Revised: 03/05/2016 Document Reviewed: 08/18/2015  2017 Elsevier   EXERCISE AND DIET:  We recommended that you start or continue a regular exercise program for good health. Regular exercise means any activity that makes your heart beat faster and makes you sweat.  We recommend exercising at least 30 minutes per day at least 3 days a week, preferably 4 or 5.  We also recommend a diet low in fat and sugar.  Inactivity, poor dietary choices and obesity can cause diabetes, heart attack, stroke, and kidney damage, among others.    ALCOHOL AND SMOKING:  Women should limit their alcohol intake to no more than 7 drinks/beers/glasses of wine (combined, not each!) per week. Moderation of alcohol intake to this level decreases your risk of breast cancer and liver damage. And of course, no recreational drugs are part of a healthy lifestyle.  And absolutely no smoking or even second hand smoke. Most people know smoking can cause heart and lung diseases, but did you know it also contributes to weakening of your bones? Aging of your skin?  Yellowing of your teeth and nails?  CALCIUM AND VITAMIN D:  Adequate intake of calcium and Vitamin D are recommended.  The recommendations for exact amounts of these supplements seem to change often, but generally speaking 600 mg of calcium (either carbonate or citrate) and 800 units of Vitamin D per day seems prudent. Certain women may benefit from higher intake of Vitamin D.  If you are among these women, your doctor will have told you during your visit.    PAP SMEARS:  Pap smears, to check for cervical cancer or precancers,  have traditionally been done yearly, although recent scientific advances have shown that most women can have pap smears less often.  However, every woman still should have a  physical exam from her gynecologist every year. It will include a breast check, inspection of the vulva and vagina to check for abnormal growths or skin changes, a visual exam of the cervix, and then an exam to evaluate the size and shape of the uterus and ovaries.  And after 33 years of age, a rectal exam is indicated to check for rectal cancers. We will also provide age appropriate advice regarding health maintenance, like when you should have certain vaccines, screening for sexually transmitted diseases, bone density testing, colonoscopy, mammograms, etc.   MAMMOGRAMS:  All women over 74 years old should have a yearly mammogram. Many facilities now offer a "3D" mammogram, which may cost around $50 extra out of pocket. If possible,  we recommend you accept the option to have the 3D mammogram performed.  It both reduces the number of women who will be called back for extra views which then turn out to be normal, and it is  better than the routine mammogram at detecting truly abnormal areas.    COLONOSCOPY:  Colonoscopy to screen for colon cancer is recommended for all women at age 17.  We know, you hate the idea of the prep.  We agree, BUT, having colon cancer and not knowing it is worse!!  Colon cancer so often starts as a polyp that can be seen and removed at colonscopy, which can quite literally save your life!  And if your first colonoscopy is normal and you have no family history of colon cancer, most women don't have to have it again for 10 years.  Once every ten years, you can do something that may end up saving your life, right?  We will be happy to help you get it scheduled when you are ready.  Be sure to check your insurance coverage so you understand how much it will cost.  It may be covered as a preventative service at no cost, but you should check your particular policy.

## 2016-09-03 LAB — COMPREHENSIVE METABOLIC PANEL
ALBUMIN: 4.3 g/dL (ref 3.6–5.1)
ALT: 12 U/L (ref 6–29)
AST: 15 U/L (ref 10–30)
Alkaline Phosphatase: 44 U/L (ref 33–115)
BUN: 7 mg/dL (ref 7–25)
CALCIUM: 9 mg/dL (ref 8.6–10.2)
CHLORIDE: 105 mmol/L (ref 98–110)
CO2: 27 mmol/L (ref 20–31)
Creat: 0.58 mg/dL (ref 0.50–1.10)
Glucose, Bld: 74 mg/dL (ref 65–99)
POTASSIUM: 3.8 mmol/L (ref 3.5–5.3)
SODIUM: 138 mmol/L (ref 135–146)
TOTAL PROTEIN: 6.4 g/dL (ref 6.1–8.1)
Total Bilirubin: 0.7 mg/dL (ref 0.2–1.2)

## 2016-09-03 LAB — CBC
HCT: 42.2 % (ref 35.0–45.0)
HEMOGLOBIN: 13.7 g/dL (ref 11.7–15.5)
MCH: 31.5 pg (ref 27.0–33.0)
MCHC: 32.5 g/dL (ref 32.0–36.0)
MCV: 97 fL (ref 80.0–100.0)
MPV: 11.3 fL (ref 7.5–12.5)
Platelets: 194 10*3/uL (ref 140–400)
RBC: 4.35 MIL/uL (ref 3.80–5.10)
RDW: 13.1 % (ref 11.0–15.0)
WBC: 6.2 10*3/uL (ref 3.8–10.8)

## 2016-09-03 LAB — LIPID PANEL
CHOL/HDL RATIO: 3.4 ratio (ref <5.0)
CHOLESTEROL: 150 mg/dL (ref <200)
HDL: 44 mg/dL — AB (ref >50)
LDL CALC: 84 mg/dL (ref <100)
TRIGLYCERIDES: 110 mg/dL (ref <150)
VLDL: 22 mg/dL (ref <30)

## 2016-09-03 LAB — VITAMIN D 25 HYDROXY (VIT D DEFICIENCY, FRACTURES): Vit D, 25-Hydroxy: 24 ng/mL — ABNORMAL LOW (ref 30–100)

## 2016-09-27 ENCOUNTER — Ambulatory Visit (HOSPITAL_COMMUNITY)
Admission: EM | Admit: 2016-09-27 | Discharge: 2016-09-27 | Disposition: A | Discharge status: Home / Self Care | Payer: BLUE CROSS/BLUE SHIELD | Attending: Emergency Medicine | Admitting: Emergency Medicine

## 2016-09-27 ENCOUNTER — Encounter (HOSPITAL_COMMUNITY): Payer: Self-pay | Admitting: *Deleted

## 2016-09-27 DIAGNOSIS — R0981 Nasal congestion: Secondary | ICD-10-CM | POA: Diagnosis not present

## 2016-09-27 DIAGNOSIS — J069 Acute upper respiratory infection, unspecified: Secondary | ICD-10-CM | POA: Diagnosis not present

## 2016-09-27 DIAGNOSIS — B9789 Other viral agents as the cause of diseases classified elsewhere: Secondary | ICD-10-CM | POA: Diagnosis not present

## 2016-09-27 HISTORY — DX: Anxiety disorder, unspecified: F41.9

## 2016-09-27 NOTE — Discharge Instructions (Signed)
Sudafed PE 10 mg every 4 to 6 hours as needed for congestion Allegra or Zyrtec daily as needed for drainage and runny nose. If you do not have drainage runny nose you may want to hold off on any antihistamines at this time. For stronger antihistamine may take Chlor-Trimeton 2 to 4 mg every 4 to 6 hours, may cause drowsiness. Saline nasal spray used frequently. Ibuprofen 600 mg every 6 hours as needed for pain, discomfort or fever. Drink plenty of fluids and stay well-hydrated. Robitussin every 4 hours.

## 2016-09-27 NOTE — ED Provider Notes (Signed)
CSN: [id number]     Arrival date & time 09/27/16  1201 History   First MD Initiated Contact with Patient 09/27/16 1230     Chief Complaint  Patient presents with  . Sore Throat  . Nasal Congestion   (Consider location/radiation/quality/duration/timing/severity/associated sxs/prior Treatment) 35 year old female states that 2 days ago she started with a trickle in her throat and yesterday woke up with the "full blown sore throat". She has nasal congestion as well as aches around the neck and back of the head. She denies fever, cough or shortness of breath. She has taken DayQuil and NyQuil without relief. She usually takes Zyrtec but had stopped it 2 days ago.      Past Medical History:  Diagnosis Date  . Anxiety   . Breast pain in female    breast abscess  . Fatigue   . Ovarian cyst   . Panic attack   . 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 Wears glasses    wears contacts    Past Surgical History:  Procedure Laterality Date  . BREAST SURGERY Left    breast abscess, history of peircing on that breast  . CERVICAL BIOPSY  W/ LOOP ELECTRODE EXCISION  02/2013   CIN II-III  . HAND SURGERY     Family History  Problem Relation Age of Onset  . Breast cancer Mother 43  . Cancer Mother   . Osteoarthritis Maternal Grandmother   . Cancer Maternal Grandmother   . Osteoarthritis Maternal Grandfather   . Heart failure Maternal Grandfather    Social History  Substance Use Topics  . Smoking status: Current Every Day Smoker    Packs/day: 1.00    Types: Cigarettes  . Smokeless tobacco: Never Used  . Alcohol use 8.4 oz/week    14 Cans of beer per week   OB History    Gravida Para Term Preterm AB Living   0             SAB TAB Ectopic Multiple Live Births                 Review of Systems  Constitutional: Positive for activity change. Negative for appetite change, chills, fatigue and fever.  HENT: Positive for congestion and sinus pressure. Negative for ear pain,  facial swelling, postnasal drip, rhinorrhea and sore throat.   Eyes: Negative.   Respiratory: Negative.   Cardiovascular: Negative.   Musculoskeletal: Negative for neck pain and neck stiffness.  Skin: Negative for pallor and rash.  Neurological: Negative.   Psychiatric/Behavioral: Negative for agitation.    Allergies  Oxycodone base  Home Medications   Prior to Admission medications   Medication Sig Start Date End Date Taking? Authorizing Provider  cetirizine (ZYRTEC) 10 MG tablet Take 10 mg by mouth daily.   Yes Historical Provider, MD   Meds Ordered and Administered this Visit  Medications - No data to display  BP 123/57 (BP Location: Left Arm)   Pulse 71   Temp 98 F (36.7 C) (Oral)   Resp 16   LMP 09/20/2016 (Exact Date)   SpO2 100%  No data found.   Physical Exam  Constitutional: She is oriented to person, place, and time. She appears well-developed and well-nourished. No distress.  HENT:  Head: Normocephalic and atraumatic.  Right Ear: External ear normal.  Left Ear: External ear normal.  Mouth/Throat: No oropharyngeal exudate.   TMs are normal. Oropharynx with minor erythema but very little clear PND.  Eyes: EOM  are normal. Pupils are equal, round, and reactive to light.  Neck: Normal range of motion. Neck supple.  Cardiovascular: Normal rate, regular rhythm and normal heart sounds.   Pulmonary/Chest: Effort normal and breath sounds normal. No respiratory distress.  Musculoskeletal: Normal range of motion. She exhibits no edema.  Lymphadenopathy:    She has no cervical adenopathy.  Neurological: She is alert and oriented to person, place, and time.  Skin: Skin is warm and dry.  Nursing note and vitals reviewed.   Urgent Care Course   Clinical Course     Procedures (including critical care time)  Labs Review Labs Reviewed - No data to display  Imaging Review No results found.   Visual Acuity Review  Right Eye Distance:   Left Eye Distance:    Bilateral Distance:    Right Eye Near:   Left Eye Near:    Bilateral Near:         MDM   1. Viral upper respiratory tract infection   2. Nasal congestion    Sudafed PE 10 mg every 4 to 6 hours as needed for congestion Allegra or Zyrtec daily as needed for drainage and runny nose. If you do not have drainage or  runny nose you may want to hold off on any antihistamines at this time. For stronger antihistamine may take Chlor-Trimeton 2 to 4 mg every 4 to 6 hours, may cause drowsiness. Saline nasal spray used frequently. Ibuprofen 600 mg every 6 hours as needed for pain, discomfort or fever. Drink plenty of fluids and stay well-hydrated. robitussin q 4h    Janne Napoleon, NP 09/27/16 1253

## 2016-09-27 NOTE — ED Triage Notes (Signed)
Started approx 2 days ago with slight sore throat.  Yesterday woke with body aches, facial/nasal congestion.  Denies any fevers.  Has tried Dayquil, Nyquil, and Advil.

## 2016-11-02 ENCOUNTER — Ambulatory Visit (INDEPENDENT_AMBULATORY_CARE_PROVIDER_SITE_OTHER): Payer: BLUE CROSS/BLUE SHIELD | Admitting: Physician Assistant

## 2016-11-02 VITALS — BP 110/70 | HR 86 | Temp 98.7°F | Resp 16 | Ht 64.0 in | Wt 129.8 lb

## 2016-11-02 DIAGNOSIS — B349 Viral infection, unspecified: Secondary | ICD-10-CM | POA: Diagnosis not present

## 2016-11-02 DIAGNOSIS — J3489 Other specified disorders of nose and nasal sinuses: Secondary | ICD-10-CM | POA: Diagnosis not present

## 2016-11-02 DIAGNOSIS — R0981 Nasal congestion: Secondary | ICD-10-CM | POA: Diagnosis not present

## 2016-11-02 DIAGNOSIS — Z716 Tobacco abuse counseling: Secondary | ICD-10-CM | POA: Insufficient documentation

## 2016-11-02 DIAGNOSIS — F17219 Nicotine dependence, cigarettes, with unspecified nicotine-induced disorders: Secondary | ICD-10-CM

## 2016-11-02 DIAGNOSIS — Z87891 Personal history of nicotine dependence: Secondary | ICD-10-CM | POA: Insufficient documentation

## 2016-11-02 DIAGNOSIS — Z720 Tobacco use: Secondary | ICD-10-CM

## 2016-11-02 MED ORDER — OSELTAMIVIR PHOSPHATE 75 MG PO CAPS: 75.0000 mg | ORAL_CAPSULE | Freq: Two times a day (BID) | ORAL | 0 refills | Status: DC

## 2016-11-02 MED ORDER — FLUTICASONE PROPIONATE 50 MCG/ACT NA SUSP: 2.0000 | Freq: Every day | NASAL | 6 refills | Status: DC

## 2016-11-02 MED ORDER — VARENICLINE TARTRATE 0.5 MG X 11 & 1 MG X 42 PO MISC
ORAL | 0 refills | Status: DC
Start: 2016-11-02 — End: 2017-10-01

## 2016-11-02 NOTE — Patient Instructions (Addendum)
Download the Calm app. There are a few discussions talking about smoking cessation and the mental/emotion aspects behind it.   Chantix: 0.968m once daily for 3 days, followed by 0.585mtwice daily for 4 days, followed by 68m17mwice daily for 12-24 weeks. To prevent nausea, take this after meals with full glass of water, dosage increase slowly. Follow-up in 4-6 weeks.   Please stay well hydrated, drink at least 2 liters of water a day.  Warm tea with honey, ginger slices, lemon and cloves.  Flonase: 2 sprays each nostril before bed.    Steps to Quit Smoking Smoking tobacco can be harmful to your health and can affect almost every organ in your body. Smoking puts you, and those around you, at risk for developing many serious chronic diseases. Quitting smoking is difficult, but it is one of the best things that you can do for your health. It is never too late to quit. What are the benefits of quitting smoking? When you quit smoking, you lower your risk of developing serious diseases and conditions, such as:  Lung cancer or lung disease, such as COPD.  Heart disease.  Stroke.  Heart attack.  Infertility.  Osteoporosis and bone fractures. Additionally, symptoms such as coughing, wheezing, and shortness of breath may get better when you quit. You may also find that you get sick less often because your body is stronger at fighting off colds and infections. If you are pregnant, quitting smoking can help to reduce your chances of having a baby of low birth weight. How do I get ready to quit? When you decide to quit smoking, create a plan to make sure that you are successful. Before you quit:  Pick a date to quit. Set a date within the next two weeks to give you time to prepare.  Write down the reasons why you are quitting. Keep this list in places where you will see it often, such as on your bathroom mirror or in your car or wallet.  Identify the people, places, things, and activities that make  you want to smoke (triggers) and avoid them. Make sure to take these actions:  Throw away all cigarettes at home, at work, and in your car.  Throw away smoking accessories, such as ashScientist, research (medical)Clean your car and make sure to empty the ashtray.  Clean your home, including curtains and carpets.  Tell your family, friends, and coworkers that you are quitting. Support from your loved ones can make quitting easier.  Talk with your health care provider about your options for quitting smoking.  Find out what treatment options are covered by your health insurance. What strategies can I use to quit smoking? Talk with your healthcare provider about different strategies to quit smoking. Some strategies include:  Quitting smoking altogether instead of gradually lessening how much you smoke over a period of time. Research shows that quitting "cold turKuwaits more successful than gradually quitting.  Attending in-person counseling to help you build problem-solving skills. You are more likely to have success in quitting if you attend several counseling sessions. Even short sessions of 10 minutes can be effective.  Finding resources and support systems that can help you to quit smoking and remain smoke-free after you quit. These resources are most helpful when you use them often. They can include:  Online chats with a couSocial workerTelephone quitlines.  Printed selFurniture conservator/restorerSupport groups or group counseling.  Text messaging programs.  Mobile phone applications.  Taking medicines to help you quit smoking. (If you are pregnant or breastfeeding, talk with your health care provider first.) Some medicines contain nicotine and some do not. Both types of medicines help with cravings, but the medicines that include nicotine help to relieve withdrawal symptoms. Your health care provider may recommend:  Nicotine patches, gum, or lozenges.  Nicotine inhalers or  sprays.  Non-nicotine medicine that is taken by mouth. Talk with your health care provider about combining strategies, such as taking medicines while you are also receiving in-person counseling. Using these two strategies together makes you more likely to succeed in quitting than if you used either strategy on its own. If you are pregnant or breastfeeding, talk with your health care provider about finding counseling or other support strategies to quit smoking. Do not take medicine to help you quit smoking unless told to do so by your health care provider. What things can I do to make it easier to quit? Quitting smoking might feel overwhelming at first, but there is a lot that you can do to make it easier. Take these important actions:  Reach out to your family and friends and ask that they support and encourage you during this time. Call telephone quitlines, reach out to support groups, or work with a counselor for support.  Ask people who smoke to avoid smoking around you.  Avoid places that trigger you to smoke, such as bars, parties, or smoke-break areas at work.  Spend time around people who do not smoke.  Lessen stress in your life, because stress can be a smoking trigger for some people. To lessen stress, try:  Exercising regularly.  Deep-breathing exercises.  Yoga.  Meditating.  Performing a body scan. This involves closing your eyes, scanning your body from head to toe, and noticing which parts of your body are particularly tense. Purposefully relax the muscles in those areas.  Download or purchase mobile phone or tablet apps (applications) that can help you stick to your quit plan by providing reminders, tips, and encouragement. There are many free apps, such as QuitGuide from the [employer] Office manager for Disease Control and Prevention). You can find other support for quitting smoking (smoking cessation) through smokefree.gov and other websites. How will I feel when I quit  smoking? Within the first 24 hours of quitting smoking, you may start to feel some withdrawal symptoms. These symptoms are usually most noticeable 2-3 days after quitting, but they usually do not last beyond 2-3 weeks. Changes or symptoms that you might experience include:  Mood swings.  Restlessness, anxiety, or irritation.  Difficulty concentrating.  Dizziness.  Strong cravings for sugary foods in addition to nicotine.  Mild weight gain.  Constipation.  Nausea.  Coughing or a sore throat.  Changes in how your medicines work in your body.  A depressed mood.  Difficulty sleeping (insomnia). After the first 2-3 weeks of quitting, you may start to notice more positive results, such as:  Improved sense of smell and taste.  Decreased coughing and sore throat.  Slower heart rate.  Lower blood pressure.  Clearer skin.  The ability to breathe more easily.  Fewer sick days. Quitting smoking is very challenging for most people. Do not get discouraged if you are not successful the first time. Some people need to make many attempts to quit before they achieve long-term success. Do your best to stick to your quit plan, and talk with your health care provider if you have any questions or concerns. This information is  not intended to replace advice given to you by your health care provider. Make sure you discuss any questions you have with your health care provider. Document Released: 09/22/2001 Document Revised: 05/26/2016 Document Reviewed: 02/12/2015 Elsevier Interactive Patient Education  2017 [employer].   Follow these instructions at home:  Drink enough water and fluids to keep your urine clear or pale yellow.  Get lots of rest.  Gargle with 8 oz of salt water ( tsp of salt per 1 qt of water) as often as every 1-2 hours to soothe your throat.  Throat lozenges (if you are not at risk for choking) or sprays may be used to soothe your throat.   Thank you for coming  in today. I hope you feel we met your needs.  Feel free to call [facility] if you have any questions or further requests.  Please consider signing up for MyChart if you do not already have it, as this is a great way to communicate with me.  Best,  [employer], PA-C

## 2016-11-02 NOTE — Progress Notes (Signed)
Donna Mcdowell  MRN: [id number] DOB: [birth year]  PCP: No PCP Per Patient  Subjective:  Pt is a pleasant 36 year old female presents to clinic for headache, sore throat, ear pain.  Felt achy this morning when she woke up. Started with itchy throat, ear pressure, headache, decreased appetite. Had a head cold about 2 weeks ago. She felt felt 100% better.  She tried OTC Robitussen, sudafed and Advil.  Denies fever, chills, cough, wheezing, SOB, palpations, chest pressure or pain.  No flu shot this year.   History of smoking - x 15 years. currently smokes a pack/day. Would like to stop. She works in [employer] and states "smoking is easy, just walk outside and take a break". Denies complications from smoking. "Cannot quit cold [place]"  Review of Systems  Constitutional: Negative for chills, diaphoresis, fatigue and fever.  HENT: Positive for congestion, ear pain, rhinorrhea and sinus pressure. Negative for postnasal drip, sneezing and sore throat.   Respiratory: Negative for cough, chest tightness, shortness of breath and wheezing.   Cardiovascular: Negative for chest pain and palpitations.  Gastrointestinal: Negative for abdominal pain, diarrhea, nausea and vomiting.  Neurological: Negative for weakness, light-headedness and headaches.  Psychiatric/Behavioral: Positive for sleep disturbance.    Patient Active Problem List   Diagnosis Date Noted  . Severe cervical dysplasia   . Routine gynecological examination 01/03/2013  . Breast infection, left 03/17/2012    Current Outpatient Prescriptions on File Prior to Visit  Medication Sig Dispense Refill  . cetirizine (ZYRTEC) 10 MG tablet Take 10 mg by mouth daily.     No current facility-administered medications on file prior to visit.     Allergies  Allergen Reactions  . Oxycodone Base Nausea And Vomiting     Objective:  BP 110/70   Pulse 86   Temp 98.7 F (37.1 C) (Oral)   Resp 16   Ht 5\' 4"  (1.626 m)   Wt 129 lb  12.8 oz (58.9 kg)   LMP 10/12/2016   SpO2 98%   BMI 22.28 kg/m   Physical Exam  Constitutional: She is oriented to person, place, and time and well-developed, well-nourished, and in no distress. No distress.  HENT:  Right Ear: Tympanic membrane normal.  Left Ear: Tympanic membrane normal.  Nose: Mucosal edema present. No rhinorrhea. Right sinus exhibits no maxillary sinus tenderness and no frontal sinus tenderness. Left sinus exhibits no maxillary sinus tenderness and no frontal sinus tenderness.  Mouth/Throat: Oropharynx is clear and moist and mucous membranes are normal.  Cardiovascular: Normal rate, regular rhythm and normal heart sounds.   Pulmonary/Chest: Effort normal and breath sounds normal. No respiratory distress. She has no wheezes. She has no rales.  Neurological: She is alert and oriented to person, place, and time. GCS score is 15.  Skin: Skin is warm and dry.  Psychiatric: Mood, memory, affect and judgment normal.  Vitals reviewed.   Assessment and Plan :  1. Viral illness 2. Nasal congestion 3. Sinus pressure - oseltamivir (TAMIFLU) 75 MG capsule; Take 1 capsule (75 mg total) by mouth 2 (two) times daily.  Dispense: 10 capsule; Refill: 0 - fluticasone (FLONASE) 50 MCG/ACT nasal spray; Place 2 sprays into both nostrils daily.  Dispense: 16 g; Refill: 6 - Supportive care: Push fluids, rest, warm tea with honey for sore throat. RTC if symptoms do not improve/worsen in 5-7 days.  4. Smoking history 5. Encounter for smoking cessation counseling 6. Cigarette nicotine dependence with nicotine-induced disorder - varenicline (CHANTIX PAK)  0.5 MG X 11 & 1 MG X 42 tablet; Take one 0.5 mg tablet by mouth once daily for 3 days, then increase to one 0.5 mg tablet twice daily for 4 days, then increase to one 1 mg tablet twice daily.  Dispense: 53 tablet; Refill: 0 - Discussed CBT, she declines this today as she does not have the time. Smoking cessation tips printed out for pt. F/u in  4-6 weeks.    Mercer Pod, PA-C  [facility] 11/02/2016 12:13 PM

## 2016-11-03 ENCOUNTER — Telehealth: Payer: Self-pay

## 2016-11-03 ENCOUNTER — Ambulatory Visit (INDEPENDENT_AMBULATORY_CARE_PROVIDER_SITE_OTHER): Payer: BLUE CROSS/BLUE SHIELD | Admitting: Student

## 2016-11-03 DIAGNOSIS — J111 Influenza due to unidentified influenza virus with other respiratory manifestations: Secondary | ICD-10-CM

## 2016-11-03 MED ORDER — DICLOFENAC SODIUM 75 MG PO TBEC
75.0000 mg | DELAYED_RELEASE_TABLET | Freq: Two times a day (BID) | ORAL | 0 refills | Status: DC
Start: 2016-11-03 — End: 2018-07-19

## 2016-11-03 NOTE — Telephone Encounter (Signed)
Pt is at [address]

## 2016-11-03 NOTE — Progress Notes (Signed)
Subjective:    Patient ID: Donna Mcdowell, female    DOB: [birth year], 36 y.o.   MRN: [id number]  HPI Presents 1 day after visit to [facility] for viral illness.  Was told she likely had the flu and given symptomatic treatment.  Follows up because symptoms worse and concerned about sinus infection.  She has worsening congestion and swollen lymph nodes.  She has been taking ibuprofen and OTC medications without relief.  Tamiflu prescribed and she has been taking this as well.  She would like relief from her pain and nasal congestion and help with sleep.  She had chills last night and temperature 100 degrees.    PMHx - Updated and reviewed.  Contributory factors include: Negative PSHx - Updated and reviewed.  Contributory factors include:  Negative FHx - Updated and reviewed.  Contributory factors include:  Negative Social Hx - Updated and reviewed. Contributory factors include: Negative, smoker Medications - reviewed     Review of Systems  Constitutional: Positive for activity change, appetite change, chills, fatigue and fever. Negative for unexpected weight change.  HENT: Positive for congestion, rhinorrhea, sinus pressure and sore throat. Negative for ear pain, postnasal drip, trouble swallowing and voice change.   Eyes: Negative for pain and discharge.  Respiratory: Positive for cough. Negative for chest tightness, shortness of breath and wheezing.   Cardiovascular: Negative for chest pain, palpitations and leg swelling.  Gastrointestinal: Negative for abdominal pain and nausea.  Genitourinary: Negative for dysuria and urgency.  Musculoskeletal: Positive for myalgias and neck pain. Negative for arthralgias and joint swelling.  Skin: Negative for pallor, rash and wound.  Neurological: Negative for dizziness and syncope.  Psychiatric/Behavioral: Negative for agitation and confusion.  All other systems reviewed and are negative.      Objective:   Physical Exam  Constitutional: She is  oriented to person, place, and time. She appears well-developed and well-nourished. No distress.  Appeared fatigued  HENT:  Head: Normocephalic and atraumatic.  Right Ear: External ear normal.  Left Ear: External ear normal.  Mouth/Throat: No oropharyngeal exudate.  Very edematous but not significantly erythematous nasal turbinates.  Clear discharge  Eyes: Conjunctivae are normal. Right eye exhibits no discharge. Left eye exhibits no discharge.  Neck: Normal range of motion. Neck supple. No thyromegaly present.  Posterior cervical LAD  Cardiovascular: Normal rate, regular rhythm, normal heart sounds and intact distal pulses.  Exam reveals no gallop and no friction rub.   No murmur heard. Pulmonary/Chest: Effort normal and breath sounds normal. No respiratory distress. She has no wheezes. She has no rales. She exhibits no tenderness.  Musculoskeletal: Normal range of motion. She exhibits no edema.  Lymphadenopathy:    She has cervical adenopathy.  Neurological: She is alert and oriented to person, place, and time.  Skin: Skin is warm. No rash noted. She is not diaphoretic. No erythema.  Psychiatric: She has a normal mood and affect. Her behavior is normal. Judgment and thought content normal.  Nursing note and vitals reviewed.  BP 114/62 (BP Location: Right Arm, Patient Position: Sitting, Cuff Size: Normal)   Pulse 94   Temp 97.9 F (36.6 C) (Oral)   Resp 18   Ht 5\' 4"  (1.626 m)   Wt 133 lb 3.2 oz (60.4 kg)   LMP 10/12/2016   SpO2 99%   BMI 22.86 kg/m         Assessment & Plan:  Influenza Diagnosed clinically, was given tamiflu yesterday.  Recommend continued supportive care.  Tried different  anti-inflammatory, but did discuss that this may not be better than ibuprofen.  Gave reassurance that this is within the realm of normal for the flu and that it may be worse over the next few days.    Signed,  Balinda Quails, DO [facility] 6:50 PM 11/03/16

## 2016-11-03 NOTE — Telephone Encounter (Signed)
Pt was seen yesterday and given tamiflu but she also has a real bad sore throat and is needing  Something to hlep with that  Best number [phone]

## 2016-11-03 NOTE — Assessment & Plan Note (Signed)
Diagnosed clinically, was given tamiflu yesterday.  Recommend continued supportive care.  Tried different anti-inflammatory, but did discuss that this may not be better than ibuprofen.  Gave reassurance that this is within the realm of normal for the flu and that it may be worse over the next few days.

## 2016-11-03 NOTE — Patient Instructions (Addendum)
  You can take acetaminophen with the diclofenac (voltaren) that was sent in.  Do not take it with aleve or advil.     IF you received an x-ray today, you will receive an invoice from [facility]. Please contact [facility] at [phone] with questions or concerns regarding your invoice.   IF you received labwork today, you will receive an invoice from [facility]. Please contact [facility] at [phone] with questions or concerns regarding your invoice.   Our billing staff will not be able to assist you with questions regarding bills from these companies.  You will be contacted with the lab results as soon as they are available. The fastest way to get your results is to activate your My Chart account. Instructions are located on the last page of this paperwork. If you have not heard from [place] regarding the results in 2 weeks, please contact this office.

## 2016-12-07 ENCOUNTER — Ambulatory Visit: Payer: BLUE CROSS/BLUE SHIELD | Admitting: Physician Assistant

## 2017-05-19 ENCOUNTER — Encounter: Payer: BLUE CROSS/BLUE SHIELD | Admitting: Physician Assistant

## 2017-05-19 ENCOUNTER — Telehealth: Payer: Self-pay | Admitting: Physician Assistant

## 2017-05-19 ENCOUNTER — Ambulatory Visit (INDEPENDENT_AMBULATORY_CARE_PROVIDER_SITE_OTHER): Payer: BLUE CROSS/BLUE SHIELD | Admitting: Physician Assistant

## 2017-05-19 ENCOUNTER — Encounter: Payer: Self-pay | Admitting: Physician Assistant

## 2017-05-19 VITALS — BP 106/74 | HR 80 | Temp 97.9°F | Resp 16 | Ht 64.25 in | Wt 131.8 lb

## 2017-05-19 DIAGNOSIS — Z8639 Personal history of other endocrine, nutritional and metabolic disease: Secondary | ICD-10-CM

## 2017-05-19 DIAGNOSIS — Z13220 Encounter for screening for lipoid disorders: Secondary | ICD-10-CM | POA: Diagnosis not present

## 2017-05-19 DIAGNOSIS — F17210 Nicotine dependence, cigarettes, uncomplicated: Secondary | ICD-10-CM | POA: Diagnosis not present

## 2017-05-19 DIAGNOSIS — Z0000 Encounter for general adult medical examination without abnormal findings: Secondary | ICD-10-CM | POA: Diagnosis not present

## 2017-05-19 DIAGNOSIS — Z131 Encounter for screening for diabetes mellitus: Secondary | ICD-10-CM | POA: Diagnosis not present

## 2017-05-19 DIAGNOSIS — Z803 Family history of malignant neoplasm of breast: Secondary | ICD-10-CM

## 2017-05-19 DIAGNOSIS — R591 Generalized enlarged lymph nodes: Secondary | ICD-10-CM

## 2017-05-19 DIAGNOSIS — Z130 Encounter for screening for diseases of the blood and blood-forming organs and certain disorders involving the immune mechanism: Secondary | ICD-10-CM

## 2017-05-19 LAB — POCT URINALYSIS DIP (MANUAL ENTRY)
Blood, UA: NEGATIVE
Glucose, UA: NEGATIVE mg/dL
Ketones, POC UA: NEGATIVE mg/dL
Leukocytes, UA: NEGATIVE
Nitrite, UA: NEGATIVE
Protein Ur, POC: NEGATIVE mg/dL
Spec Grav, UA: 1.025 (ref 1.010–1.025)
Urobilinogen, UA: 0.2 E.U./dL
pH, UA: 6 (ref 5.0–8.0)

## 2017-05-19 LAB — POC MICROSCOPIC URINALYSIS (UMFC): Mucus: ABSENT

## 2017-05-19 MED ORDER — AMOXICILLIN-POT CLAVULANATE 875-125 MG PO TABS: 1.0000 | ORAL_TABLET | Freq: Two times a day (BID) | ORAL | 0 refills | Status: AC

## 2017-05-19 NOTE — Patient Instructions (Addendum)
Get those chompers cleaned.  Start knitting instead of smoking.  You will receive a phone call to schedule your routine screening mammogram.  Start taking Augmentin. Come back and see me in 2-3 weeks to follow-up on that lump in your neck.  You will be contacted regarding the results of today's lab tests.    Thank you for coming in today. I hope you feel we met your needs.  Feel free to call PCP if you have any questions or further requests.  Please consider signing up for MyChart if you do not already have it, as this is a great way to communicate with me.  Best,  Whitney McVey, PA-C   Health Maintenance, Female Adopting a healthy lifestyle and getting preventive care can go a long way to promote health and wellness. Talk with your health care provider about what schedule of regular examinations is right for you. This is a good chance for you to check in with your provider about disease prevention and staying healthy. In between checkups, there are plenty of things you can do on your own. Experts have done a lot of research about which lifestyle changes and preventive measures are most likely to keep you healthy. Ask your health care provider for more information. Weight and diet Eat a healthy diet  Be sure to include plenty of vegetables, fruits, low-fat dairy products, and lean protein.  Do not eat a lot of foods high in solid fats, added sugars, or salt.  Get regular exercise. This is one of the most important things you can do for your health. ? Most adults should exercise for at least 150 minutes each week. The exercise should increase your heart rate and make you sweat (moderate-intensity exercise). ? Most adults should also do strengthening exercises at least twice a week. This is in addition to the moderate-intensity exercise.  Maintain a healthy weight  Body mass index (BMI) is a measurement that can be used to identify possible weight problems. It estimates body fat based on  height and weight. Your health care provider can help determine your BMI and help you achieve or maintain a healthy weight.  For females 39 years of age and older: ? A BMI below 18.5 is considered underweight. ? A BMI of 18.5 to 24.9 is normal. ? A BMI of 25 to 29.9 is considered overweight. ? A BMI of 30 and above is considered obese.  Watch levels of cholesterol and blood lipids  You should start having your blood tested for lipids and cholesterol at 34 years of age, then have this test every 5 years.  You may need to have your cholesterol levels checked more often if: ? Your lipid or cholesterol levels are high. ? You are older than 34 years of age. ? You are at high risk for heart disease.  Cancer screening Lung Cancer  Lung cancer screening is recommended for adults 75-41 years old who are at high risk for lung cancer because of a history of smoking.  A yearly low-dose CT scan of the lungs is recommended for people who: ? Currently smoke. ? Have quit within the past 15 years. ? Have at least a 30-pack-year history of smoking. A pack year is smoking an average of one pack of cigarettes a day for 1 year.  Yearly screening should continue until it has been 15 years since you quit.  Yearly screening should stop if you develop a health problem that would prevent you from having lung cancer  treatment.  Breast Cancer  Practice breast self-awareness. This means understanding how your breasts normally appear and feel.  It also means doing regular breast self-exams. Let your health care provider know about any changes, no matter how small.  If you are in your 20s or 30s, you should have a clinical breast exam (CBE) by a health care provider every 1-3 years as part of a regular health exam.  If you are 51 or older, have a CBE every year. Also consider having a breast X-ray (mammogram) every year.  If you have a family history of breast cancer, talk to your health care provider  about genetic screening.  If you are at high risk for breast cancer, talk to your health care provider about having an MRI and a mammogram every year.  Breast cancer gene (BRCA) assessment is recommended for women who have family members with BRCA-related cancers. BRCA-related cancers include: ? Breast. ? Ovarian. ? Tubal. ? Peritoneal cancers.  Results of the assessment will determine the need for genetic counseling and BRCA1 and BRCA2 testing.  Cervical Cancer Your health care provider may recommend that you be screened regularly for cancer of the pelvic organs (ovaries, uterus, and vagina). This screening involves a pelvic examination, including checking for microscopic changes to the surface of your cervix (Pap test). You may be encouraged to have this screening done every 3 years, beginning at age 74.  For women ages 63-65, health care providers may recommend pelvic exams and Pap testing every 3 years, or they may recommend the Pap and pelvic exam, combined with testing for human papilloma virus (HPV), every 5 years. Some types of HPV increase your risk of cervical cancer. Testing for HPV may also be done on women of any age with unclear Pap test results.  Other health care providers may not recommend any screening for nonpregnant women who are considered low risk for pelvic cancer and who do not have symptoms. Ask your health care provider if a screening pelvic exam is right for you.  If you have had past treatment for cervical cancer or a condition that could lead to cancer, you need Pap tests and screening for cancer for at least 20 years after your treatment. If Pap tests have been discontinued, your risk factors (such as having a new sexual partner) need to be reassessed to determine if screening should resume. Some women have medical problems that increase the chance of getting cervical cancer. In these cases, your health care provider may recommend more frequent screening and Pap  tests.  Colorectal Cancer  This type of cancer can be detected and often prevented.  Routine colorectal cancer screening usually begins at 34 years of age and continues through 34 years of age.  Your health care provider may recommend screening at an earlier age if you have risk factors for colon cancer.  Your health care provider may also recommend using home test kits to check for hidden blood in the stool.  A small camera at the end of a tube can be used to examine your colon directly (sigmoidoscopy or colonoscopy). This is done to check for the earliest forms of colorectal cancer.  Routine screening usually begins at age 47.  Direct examination of the colon should be repeated every 5-10 years through 34 years of age. However, you may need to be screened more often if early forms of precancerous polyps or small growths are found.  Skin Cancer  Check your skin from head to toe regularly.  Tell your health care provider about any new moles or changes in moles, especially if there is a change in a mole's shape or color.  Also tell your health care provider if you have a mole that is larger than the size of a pencil eraser.  Always use sunscreen. Apply sunscreen liberally and repeatedly throughout the day.  Protect yourself by wearing long sleeves, pants, a wide-brimmed hat, and sunglasses whenever you are outside.  Heart disease, diabetes, and high blood pressure  High blood pressure causes heart disease and increases the risk of stroke. High blood pressure is more likely to develop in: ? People who have blood pressure in the high end of the normal range (130-139/85-89 mm Hg). ? People who are overweight or obese. ? People who are African American.  If you are 16-77 years of age, have your blood pressure checked every 3-5 years. If you are 34 years of age or older, have your blood pressure checked every year. You should have your blood pressure measured twice-once when you are at  a hospital or clinic, and once when you are not at a hospital or clinic. Record the average of the two measurements. To check your blood pressure when you are not at a hospital or clinic, you can use: ? An automated blood pressure machine at a pharmacy. ? A home blood pressure monitor.  If you are between 38 years and 69 years old, ask your health care provider if you should take aspirin to prevent strokes.  Have regular diabetes screenings. This involves taking a blood sample to check your fasting blood sugar level. ? If you are at a normal weight and have a low risk for diabetes, have this test once every three years after 34 years of age. ? If you are overweight and have a high risk for diabetes, consider being tested at a younger age or more often. Preventing infection Hepatitis B  If you have a higher risk for hepatitis B, you should be screened for this virus. You are considered at high risk for hepatitis B if: ? You were born in a country where hepatitis B is common. Ask your health care provider which countries are considered high risk. ? Your parents were born in a high-risk country, and you have not been immunized against hepatitis B (hepatitis B vaccine). ? You have HIV or AIDS. ? You use needles to inject street drugs. ? You live with someone who has hepatitis B. ? You have had sex with someone who has hepatitis B. ? You get hemodialysis treatment. ? You take certain medicines for conditions, including cancer, organ transplantation, and autoimmune conditions.  Hepatitis C  Blood testing is recommended for: ? Everyone born from [birth year] through 1963. ? Anyone with known risk factors for hepatitis C.  Sexually transmitted infections (STIs)  You should be screened for sexually transmitted infections (STIs) including gonorrhea and chlamydia if: ? You are sexually active and are younger than 34 years of age. ? You are older than 34 years of age and your health care provider tells  you that you are at risk for this type of infection. ? Your sexual activity has changed since you were last screened and you are at an increased risk for chlamydia or gonorrhea. Ask your health care provider if you are at risk.  If you do not have HIV, but are at risk, it may be recommended that you take a prescription medicine daily to prevent HIV infection. This is called  pre-exposure prophylaxis (PrEP). You are considered at risk if: ? You are sexually active and do not regularly use condoms or know the HIV status of your partner(s). ? You take drugs by injection. ? You are sexually active with a partner who has HIV.  Talk with your health care provider about whether you are at high risk of being infected with HIV. If you choose to begin PrEP, you should first be tested for HIV. You should then be tested every 3 months for as long as you are taking PrEP. Pregnancy  If you are premenopausal and you may become pregnant, ask your health care provider about preconception counseling.  If you may become pregnant, take 400 to 800 micrograms (mcg) of folic acid every day.  If you want to prevent pregnancy, talk to your health care provider about birth control (contraception). Osteoporosis and menopause  Osteoporosis is a disease in which the bones lose minerals and strength with aging. This can result in serious bone fractures. Your risk for osteoporosis can be identified using a bone density scan.  If you are 4 years of age or older, or if you are at risk for osteoporosis and fractures, ask your health care provider if you should be screened.  Ask your health care provider whether you should take a calcium or vitamin D supplement to lower your risk for osteoporosis.  Menopause may have certain physical symptoms and risks.  Hormone replacement therapy may reduce some of these symptoms and risks. Talk to your health care provider about whether hormone replacement therapy is right for  you. Follow these instructions at home:  Schedule regular health, dental, and eye exams.  Stay current with your immunizations.  Do not use any tobacco products including cigarettes, chewing tobacco, or electronic cigarettes.  If you are pregnant, do not drink alcohol.  If you are breastfeeding, limit how much and how often you drink alcohol.  Limit alcohol intake to no more than 1 drink per day for nonpregnant women. One drink equals 12 ounces of beer, 5 ounces of wine, or 1 ounces of hard liquor.  Do not use street drugs.  Do not share needles.  Ask your health care provider for help if you need support or information about quitting drugs.  Tell your health care provider if you often feel depressed.  Tell your health care provider if you have ever been abused or do not feel safe at home. This information is not intended to replace advice given to you by your health care provider. Make sure you discuss any questions you have with your health care provider. Document Released: 04/13/2011 Document Revised: 03/05/2016 Document Reviewed: 07/02/2015 Elsevier Interactive Patient Education  2018 [employer].  IF you received an x-ray today, you will receive an invoice from [facility]. Please contact [facility] at [phone] with questions or concerns regarding your invoice.   IF you received labwork today, you will receive an invoice from [place]. Please contact [facility] at [phone] with questions or concerns regarding your invoice.   Our billing staff will not be able to assist you with questions regarding bills from these companies.  You will be contacted with the lab results as soon as they are available. The fastest way to get your results is to activate your My Chart account. Instructions are located on the last page of this paperwork. If you have not heard from [place] regarding the results in 2 weeks, please contact this office.

## 2017-05-19 NOTE — Telephone Encounter (Signed)
Pt is needing to talk again to [facility] states that it is about something they discussed at todays visit   Best bnumber [phone]

## 2017-05-19 NOTE — Progress Notes (Signed)
[facility] at [place], [place] [address] [phone]  Date:  05/19/2017   Name:  Donna Mcdowell   DOB:  Jan 06, 1981   MRN:  [id number]  PCP:  Patient, No Pcp Per    Chief Complaint: Annual Exam (no pap)   History of Present Illness:  This is a pleasant 36 y.o. female with PMH none who is presenting for CPE. 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   Complaints: lump on neck x 1 month. Not painful. Not tender to touch. Denies tooth pain or abnormal drainage from mouth, rash, increased warmth. Contraception: n/a Last pap: 09/02/2015 - Negative. 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 Negative pap and negative hpv in 3/15 and 11/16 Sexual history: active. Married to female partner. Her wife is a Corporate treasurer.  Immunizations: UTD Dentist: needs to be seen. She does not floss.  Eye: vision is good Diet/Exercise: She does not eat much in the way of vegetables. She does not exercise on a regular basis. Used to do [place].  Fam hx: Mother breast cancer at 55 yo Tobacco/alcohol/substance use: History of smoking - x 15 years. currently smokes a pack/day. Would like to stop. She works in [employer] and states "smoking is easy, just walk outside and take a break". Denies complications from smoking. "Cannot quit cold [place]". She was Rx Chantix at her last OV - never started.  Drinks about 6 oz alcohol/week. No drug use.   Dr. Talbert Nan GYN - [facility]. She receives PAPs here   Review of Systems:  Review of Systems  Constitutional: Negative for chills, diaphoresis, fatigue and fever.  HENT: Negative for congestion, postnasal drip, rhinorrhea, sinus pressure, sneezing and sore throat.   Respiratory: Negative for cough, chest tightness, shortness of breath and wheezing.   Cardiovascular: Negative for chest pain and palpitations.  Gastrointestinal: Negative for abdominal pain, diarrhea, nausea and  vomiting.  Genitourinary: Negative for decreased urine volume, difficulty urinating, dysuria, enuresis, flank pain, frequency, hematuria and urgency.  Musculoskeletal: Negative for back pain.  Skin: Negative for color change and rash.  Neurological: Negative for dizziness, weakness, light-headedness and headaches.    Patient Active Problem List   Diagnosis Date Noted  . Influenza 11/03/2016  . Cigarette nicotine dependence with nicotine-induced disorder 11/02/2016  . Encounter for smoking cessation counseling 11/02/2016  . Severe cervical dysplasia     Prior to Admission medications   Medication Sig Start Date End Date Taking? Authorizing Provider  cetirizine (ZYRTEC) 10 MG tablet Take 10 mg by mouth daily.   Yes [provider]  fluticasone (FLONASE) 50 MCG/ACT nasal spray Place 2 sprays into both nostrils daily. 11/02/16  Yes Victorino Fatzinger, Gelene Mink, PA-C  ibuprofen (ADVIL,MOTRIN) 200 MG tablet Take 200 mg by mouth every 6 (six) hours as needed.   Yes [provider]  diclofenac (VOLTAREN) 75 MG EC tablet Take 1 tablet (75 mg total) by mouth 2 (two) times daily. Patient not taking: Reported on 05/19/2017 9/48/54   Chitanand, Elmo Putt B, DO  varenicline (CHANTIX PAK) 0.5 MG X 11 & 1 MG X 42 tablet Take one 0.5 mg tablet by mouth once daily for 3 days, then increase to one 0.5 mg tablet twice daily for 4 days, then increase to one 1 mg tablet twice daily. Patient not taking: Reported on 05/19/2017 11/02/16   Samarie Pinder, Gelene Mink, PA-C    Allergies  Allergen Reactions  .  Oxycodone Base Nausea And Vomiting    Past Surgical History:  Procedure Laterality Date  . BREAST SURGERY Left    breast abscess, history of peircing on that breast  . CERVICAL BIOPSY  W/ LOOP ELECTRODE EXCISION  02/2013   CIN II-III  . HAND SURGERY      Social History  Substance Use Topics  . Smoking status: Current Every Day Smoker    Packs/day: 1.00    Types: Cigarettes  . Smokeless  tobacco: Never Used  . Alcohol use 6.0 oz/week    10 Cans of beer per week    Family History  Problem Relation Age of Onset  . Breast cancer Mother 53  . Cancer Mother        Breast  . Osteoarthritis Maternal Grandmother   . Cancer Maternal Grandmother        Lung  . Osteoarthritis Maternal Grandfather   . Heart failure Maternal Grandfather     Medication list has been reviewed and updated.  Physical Examination:  Physical Exam  Constitutional: She is oriented to person, place, and time. Vital signs are normal. She appears well-developed and well-nourished. No distress.  HENT:  Head: Normocephalic and atraumatic.  Mouth/Throat: Oropharynx is clear and moist and mucous membranes are normal. No oral lesions. Normal dentition. No dental abscesses or dental caries.  Eyes: Pupils are equal, round, and reactive to light. Conjunctivae and EOM are normal.  Neck: Normal range of motion. Neck supple. Carotid bruit is not present. No thyroid mass and no thyromegaly present.  Cardiovascular: Normal rate, regular rhythm, S1 normal, S2 normal, normal heart sounds and normal pulses.   No murmur heard. Pulmonary/Chest: Effort normal and breath sounds normal. She has no wheezes.  Abdominal: Soft. Normal appearance and bowel sounds are normal. There is no tenderness.  Musculoskeletal: Normal range of motion.       Right ankle: She exhibits no swelling.       Left ankle: She exhibits no swelling.  Lymphadenopathy:  Submandibular lymphadenopathy. Not TTP. No skin changes. No warmth or induration.   Neurological: She is alert and oriented to person, place, and time. She has normal strength and normal reflexes.  Skin: Skin is warm and dry.  Multiple benign-appearing nevi on abdomen.  Multiple tattoos   Psychiatric: She has a normal mood and affect. Her behavior is normal. Judgment and thought content normal.  Vitals reviewed.   BP 106/74   Pulse 80   Temp 97.9 F (36.6 C) (Oral)   Resp 16    Ht 5' 4.25" (1.632 m)   Wt 131 lb 12.8 oz (59.8 kg)   LMP 04/27/2017   SpO2 95%   BMI 22.45 kg/m   Assessment and Plan: 1. Physical exam - Pt is a pleasant 36 year old female here for routine physical exam. She is feeling well today. Anticipatory guidance provided. Labs are pending, will contact with results.   2. Lymphadenopathy of head and neck - amoxicillin-clavulanate (AUGMENTIN) 875-125 MG tablet; Take 1 tablet by mouth 2 (two) times daily. Take with food or milk.  Dispense: 14 tablet; Refill: 0 - Concern for infection vs malignancy. Will treat with Augmentin at this time. RTC to f/u in 2-3 weeks. Given her smoking history as well as FHx of cancer, will consider imaging if lymphadenopathy is still present following treatment.  3. Smoking greater than 10 pack years - Smoking cessation discussed with pt. Encouraged starting Chantix and supplementing her boredom/free time with activity other than smoking.  4. Family history of breast cancer - MM Digital Screening; Future - Mother had breast cancer at age 88. Will initiate screening early.   5. Screening, lipid - Lipid panel  6. Screening for diabetes mellitus - CMP14+EGFR - POCT urinalysis dipstick - POCT Microscopic Urinalysis ([facility])  7. History of vitamin D deficiency - VITAMIN D 25 Hydroxy (Vit-D Deficiency, Fractures)  8. Screening, anemia, deficiency, iron - CBC with Differential/Platelet   Mercer Pod, PA-C  Primary Care at [place] 05/19/2017 10:49 AM

## 2017-05-20 LAB — CMP14+EGFR
ALT: 19 IU/L (ref 0–32)
AST: 20 IU/L (ref 0–40)
Albumin/Globulin Ratio: 2.6 — ABNORMAL HIGH (ref 1.2–2.2)
Albumin: 4.5 g/dL (ref 3.5–5.5)
Alkaline Phosphatase: 51 IU/L (ref 39–117)
BUN/Creatinine Ratio: 19 (ref 9–23)
BUN: 11 mg/dL (ref 6–20)
Bilirubin Total: 0.3 mg/dL (ref 0.0–1.2)
CO2: 22 mmol/L (ref 20–29)
Calcium: 9.1 mg/dL (ref 8.7–10.2)
Chloride: 104 mmol/L (ref 96–106)
Creatinine, Ser: 0.58 mg/dL (ref 0.57–1.00)
GFR calc Af Amer: 139 mL/min/{1.73_m2} (ref >59)
GFR calc non Af Amer: 121 mL/min/{1.73_m2} (ref >59)
Globulin, Total: 1.7 g/dL (ref 1.5–4.5)
Glucose: 80 mg/dL (ref 65–99)
Potassium: 4.1 mmol/L (ref 3.5–5.2)
Sodium: 142 mmol/L (ref 134–144)
Total Protein: 6.2 g/dL (ref 6.0–8.5)

## 2017-05-20 LAB — CBC WITH DIFFERENTIAL/PLATELET
Basophils Absolute: 0 10*3/uL (ref 0.0–0.2)
Basos: 1 %
EOS (ABSOLUTE): 0.2 10*3/uL (ref 0.0–0.4)
Eos: 3 %
Hematocrit: 42.2 % (ref 34.0–46.6)
Hemoglobin: 14.1 g/dL (ref 11.1–15.9)
Immature Grans (Abs): 0 10*3/uL (ref 0.0–0.1)
Immature Granulocytes: 0 %
Lymphocytes Absolute: 2 10*3/uL (ref 0.7–3.1)
Lymphs: 41 %
MCH: 32 pg (ref 26.6–33.0)
MCHC: 33.4 g/dL (ref 31.5–35.7)
MCV: 96 fL (ref 79–97)
Monocytes Absolute: 0.3 10*3/uL (ref 0.1–0.9)
Monocytes: 7 %
Neutrophils Absolute: 2.4 10*3/uL (ref 1.4–7.0)
Neutrophils: 48 %
Platelets: 195 10*3/uL (ref 150–379)
RBC: 4.4 x10E6/uL (ref 3.77–5.28)
RDW: 13.5 % (ref 12.3–15.4)
WBC: 4.9 10*3/uL (ref 3.4–10.8)

## 2017-05-20 LAB — LIPID PANEL
Chol/HDL Ratio: 3.7 ratio (ref 0.0–4.4)
Cholesterol, Total: 150 mg/dL (ref 100–199)
HDL: 41 mg/dL (ref >39)
LDL Calculated: 80 mg/dL (ref 0–99)
Triglycerides: 144 mg/dL (ref 0–149)
VLDL Cholesterol Cal: 29 mg/dL (ref 5–40)

## 2017-05-20 LAB — VITAMIN D 25 HYDROXY (VIT D DEFICIENCY, FRACTURES): Vit D, 25-Hydroxy: 35.2 ng/mL (ref 30.0–100.0)

## 2017-05-20 NOTE — Progress Notes (Signed)
Please call pt and let her know all her labs look fabulous. Her vitamin D level is within normal range. Her cholesterol levels, blood count, kindey and liver function look great.  Be sure to come back in 2 weeks for recheck of her neck.  Thank you!

## 2017-05-20 NOTE — Telephone Encounter (Signed)
Pt is requesting US neck as discussed with Mcvey on yesterday visit. Also requesting to reschedule f/u appt to 2 weeks instead of 3 weeks. Appointment rescheduled for 06/02/17 @ 1020am. Advised to go to [address] for f/u.  Verbalized understanding

## 2017-05-24 NOTE — Telephone Encounter (Signed)
Thank you. I spoke to pt on the phone. OK to schedule sooner.

## 2017-05-26 ENCOUNTER — Other Ambulatory Visit: Payer: Self-pay | Admitting: Physician Assistant

## 2017-05-26 DIAGNOSIS — R590 Localized enlarged lymph nodes: Secondary | ICD-10-CM

## 2017-05-26 DIAGNOSIS — F17219 Nicotine dependence, cigarettes, with unspecified nicotine-induced disorders: Secondary | ICD-10-CM

## 2017-05-26 DIAGNOSIS — R221 Localized swelling, mass and lump, neck: Secondary | ICD-10-CM | POA: Insufficient documentation

## 2017-05-31 ENCOUNTER — Ambulatory Visit
Admission: RE | Admit: 2017-05-31 | Discharge: 2017-05-31 | Disposition: A | Discharge status: Home / Self Care | Payer: BLUE CROSS/BLUE SHIELD | Source: Ambulatory Visit | Attending: Physician Assistant | Admitting: Physician Assistant

## 2017-05-31 DIAGNOSIS — R590 Localized enlarged lymph nodes: Secondary | ICD-10-CM

## 2017-05-31 MED ORDER — IOPAMIDOL (ISOVUE-300) INJECTION 61%
75.0000 mL | Freq: Once | INTRAVENOUS | Status: AC | PRN
  Administered 2017-05-31: 75 mL via INTRAVENOUS

## 2017-06-01 NOTE — Progress Notes (Signed)
Left VM for pt about CT results, which are negative.

## 2017-06-02 ENCOUNTER — Other Ambulatory Visit: Payer: Self-pay | Admitting: Physician Assistant

## 2017-06-02 ENCOUNTER — Encounter: Payer: Self-pay | Admitting: Physician Assistant

## 2017-06-02 ENCOUNTER — Ambulatory Visit (INDEPENDENT_AMBULATORY_CARE_PROVIDER_SITE_OTHER): Payer: BLUE CROSS/BLUE SHIELD | Admitting: Physician Assistant

## 2017-06-02 VITALS — BP 120/78 | HR 65 | Temp 98.3°F | Resp 18 | Ht 64.25 in | Wt 134.4 lb

## 2017-06-02 DIAGNOSIS — Z1231 Encounter for screening mammogram for malignant neoplasm of breast: Secondary | ICD-10-CM

## 2017-06-02 DIAGNOSIS — F17219 Nicotine dependence, cigarettes, with unspecified nicotine-induced disorders: Secondary | ICD-10-CM

## 2017-06-02 MED ORDER — VARENICLINE TARTRATE 1 MG PO TABS: 1.0000 mg | ORAL_TABLET | Freq: Two times a day (BID) | ORAL | 0 refills | Status: DC

## 2017-06-02 NOTE — Progress Notes (Signed)
Donna Mcdowell  MRN: [id number] DOB: 05/17/1981  PCP: Elmarie Shiley, MD  Subjective:  Pt is a pleasant 36 year old female who presents to clinic for follow-up.  She was here 8/8 for her annual exam and was concerned about a lymph node on her chin x >1 month. Started antibiotics. FHx of early onset cancer and PMH smoking. She received a soft tissue CT neck. This was negative. Today she reports lymph node has improved.   She started Chantix a few weeks ago and is down to smoking 1 pack every 2 days - this is down from 1 ppd. She stopped drinking, this has helped her a lot with cutting back with smoking. She is feeling great and states her skin is more clear and less dry.  She has been working out a lot more at [employer].   Review of Systems  Constitutional: Negative for chills, diaphoresis, fatigue and fever.  HENT: Negative for congestion, postnasal drip, rhinorrhea, sinus pressure, sneezing and sore throat.   Respiratory: Negative for cough, chest tightness, shortness of breath and wheezing.   Cardiovascular: Negative for chest pain and palpitations.  Gastrointestinal: Negative for abdominal pain, diarrhea, nausea and vomiting.  Neurological: Negative for weakness, light-headedness and headaches.    Patient Active Problem List   Diagnosis Date Noted  . Localized swelling, mass or lump of neck 05/26/2017  . Influenza 11/03/2016  . Cigarette nicotine dependence with nicotine-induced disorder 11/02/2016  . Encounter for smoking cessation counseling 11/02/2016  . Severe cervical dysplasia     Current Outpatient Prescriptions on File Prior to Visit  Medication Sig Dispense Refill  . amoxicillin-clavulanate (AUGMENTIN) 875-125 MG tablet Take 1 tablet by mouth 2 (two) times daily. Take with food or milk. 14 tablet 0  . cetirizine (ZYRTEC) 10 MG tablet Take 10 mg by mouth daily.    . fluticasone (FLONASE) 50 MCG/ACT nasal spray Place 2 sprays into both nostrils daily. 16 g 6  .  ibuprofen (ADVIL,MOTRIN) 200 MG tablet Take 200 mg by mouth every 6 (six) hours as needed.    . diclofenac (VOLTAREN) 75 MG EC tablet Take 1 tablet (75 mg total) by mouth 2 (two) times daily. (Patient not taking: Reported on 05/19/2017) 30 tablet 0  . varenicline (CHANTIX PAK) 0.5 MG X 11 & 1 MG X 42 tablet Take one 0.5 mg tablet by mouth once daily for 3 days, then increase to one 0.5 mg tablet twice daily for 4 days, then increase to one 1 mg tablet twice daily. (Patient not taking: Reported on 05/19/2017) 53 tablet 0   No current facility-administered medications on file prior to visit.     Allergies  Allergen Reactions  . Oxycodone Base Nausea And Vomiting     Objective:  BP 120/78   Pulse 65   Temp 98.3 F (36.8 C) (Oral)   Resp 18   Ht 5' 4.25" (1.632 m)   Wt 134 lb 6.4 oz (61 kg)   LMP 06/02/2017 Comment: currently on   SpO2 99%   BMI 22.89 kg/m   Physical Exam  Constitutional: She is oriented to person, place, and time and well-developed, well-nourished, and in no distress. No distress.  Cardiovascular: Normal rate, regular rhythm and normal heart sounds.   Lymphadenopathy:  Submental lymphadenopathy significantly reduced compared with her last OV  Neurological: She is alert and oriented to person, place, and time. GCS score is 15.  Skin: Skin is warm and dry.  Psychiatric: Mood, memory, affect and  judgment normal.  Vitals reviewed.   05/31/2017 CT soft tissue neck IMPRESSION: 1. Small submental lymph nodes as well as a slightly prominent number of subcentimeter lymph nodes elsewhere in the neck bilaterally, nonspecific. No lymph nodes are frankly enlarged by CT size criteria or demonstrate suspicious morphology. 2. No primary neck mass.  Assessment and Plan :  1. Cigarette nicotine dependence with nicotine-induced disorder - varenicline (CHANTIX CONTINUING MONTH PAK) 1 MG tablet; Take 1 tablet (1 mg total) by mouth 2 (two) times daily.  Dispense: 60 tablet; Refill:  0 - Pt is doing very well. Will provide continuing month pack for smoking cessation.   Mercer Pod, PA-C  Primary Care at [place] 06/02/2017 10:50 AM

## 2017-06-02 NOTE — Patient Instructions (Addendum)
Use the activation code below to start your MyChart account.  CT result: Small submental (chin) lymph nodes as well as a slightly prominent number of subcentimeter lymph nodes elsewhere in the neck bilaterally, nonspecific. No lymph nodes are frankly enlarged by CT size criteria or demonstrate suspicious morphology.  Thank you for coming in today. I hope you feel we met your needs.  Feel free to call PCP if you have any questions or further requests.  Please consider signing up for MyChart if you do not already have it, as this is a great way to communicate with me.  Best,  Whitney McVey, PA-C   Varenicline oral tablets What is this medicine? VARENICLINE (var EN i kleen) is used to help people quit smoking. It can reduce the symptoms caused by stopping smoking. It is used with a patient support program recommended by your physician. This medicine may be used for other purposes; ask your health care provider or pharmacist if you have questions. COMMON BRAND NAME(S): Chantix What should I tell my health care provider before I take this medicine? They need to know if you have any of these conditions: -bipolar disorder, depression, schizophrenia or other mental illness -heart disease -if you often drink alcohol -kidney disease -peripheral vascular disease -seizures -stroke -suicidal thoughts, plans, or attempt; a previous suicide attempt by you or a family member -an unusual or allergic reaction to varenicline, other medicines, foods, dyes, or preservatives -pregnant or trying to get pregnant -breast-feeding How should I use this medicine? Take this medicine by mouth after eating. Take with a full glass of water. Follow the directions on the prescription label. Take your doses at regular intervals. Do not take your medicine more often than directed. There are 3 ways you can use this medicine to help you quit smoking; talk to your health care professional to decide which plan is right  for you: 1) you can choose a quit date and start this medicine 1 week before the quit date, or, 2) you can start taking this medicine before you choose a quit date, and then pick a quit date between day 8 and 35 days of treatment, or, 3) if you are not sure that you are able or willing to quit smoking right away, start taking this medicine and slowly decrease the amount you smoke as directed by your health care professional with the goal of being cigarette-free by week 12 of treatment. Stick to your plan; ask about support groups or other ways to help you remain cigarette-free. If you are motivated to quit smoking and did not succeed during a previous attempt with this medicine for reasons other than side effects, or if you returned to smoking after this treatment, speak with your health care professional about whether another course of this medicine may be right for you. A special MedGuide will be given to you by the pharmacist with each prescription and refill. Be sure to read this information carefully each time. Talk to your pediatrician regarding the use of this medicine in children. This medicine is not approved for use in children. Overdosage: If you think you have taken too much of this medicine contact a poison control center or emergency room at once. NOTE: This medicine is only for you. Do not share this medicine with others. What if I miss a dose? If you miss a dose, take it as soon as you can. If it is almost time for your next dose, take only that dose. Do not take  double or extra doses. What may interact with this medicine? -alcohol or any product that contains alcohol -insulin -other stop smoking aids -theophylline -warfarin This list may not describe all possible interactions. Give your health care provider a list of all the medicines, herbs, non-prescription drugs, or dietary supplements you use. Also tell them if you smoke, drink alcohol, or use illegal drugs. Some items may  interact with your medicine. What should I watch for while using this medicine? Visit your doctor or health care professional for regular check ups. Ask for ongoing advice and encouragement from your doctor or healthcare professional, friends, and family to help you quit. If you smoke while on this medication, quit again Your mouth may get dry. Chewing sugarless gum or sucking hard candy, and drinking plenty of water may help. Contact your doctor if the problem does not go away or is severe. You may get drowsy or dizzy. Do not drive, use machinery, or do anything that needs mental alertness until you know how this medicine affects you. Do not stand or sit up quickly, especially if you are an older patient. This reduces the risk of dizzy or fainting spells. Sleepwalking can happen during treatment with this medicine, and can sometimes lead to behavior that is harmful to you, other people, or property. Stop taking this medicine and tell your doctor if you start sleepwalking or have other unusual sleep-related activity. Decrease the amount of alcoholic beverages that you drink during treatment with this medicine until you know if this medicine affects your ability to tolerate alcohol. Some people have experienced increased drunkenness (intoxication), unusual or sometimes aggressive behavior, or no memory of things that have happened (amnesia) during treatment with this medicine. The use of this medicine may increase the chance of suicidal thoughts or actions. Pay special attention to how you are responding while on this medicine. Any worsening of mood, or thoughts of suicide or dying should be reported to your health care professional right away. What side effects may I notice from receiving this medicine? Side effects that you should report to your doctor or health care professional as soon as possible: -allergic reactions like skin rash, itching or hives, swelling of the face, lips, tongue, or  throat -acting aggressive, being angry or violent, or acting on dangerous impulses -breathing problems -changes in vision -chest pain or chest tightness -confusion, trouble speaking or understanding -new or worsening depression, anxiety, or panic attacks -extreme increase in activity and talking (mania) -fast, irregular heartbeat -feeling faint or lightheaded, falls -fever -pain in legs when walking -problems with balance, talking, walking -redness, blistering, peeling or loosening of the skin, including inside the mouth -ringing in ears -seeing or hearing things that aren't there (hallucinations) -seizures -sleepwalking -sudden numbness or weakness of the face, arm or leg -thoughts about suicide or dying, or attempts to commit suicide -trouble passing urine or change in the amount of urine -unusual bleeding or bruising -unusually weak or tired Side effects that usually do not require medical attention (report to your doctor or health care professional if they continue or are bothersome): -constipation -headache -nausea, vomiting -strange dreams -stomach gas -trouble sleeping This list may not describe all possible side effects. Call your doctor for medical advice about side effects. You may report side effects to FDA at 1-800-FDA-1088. Where should I keep my medicine? Keep out of the reach of children. Store at room temperature between 15 and 30 degrees C (59 and 86 degrees F). Throw away any unused medicine after  the expiration date. NOTE: This sheet is a summary. It may not cover all possible information. If you have questions about this medicine, talk to your doctor, pharmacist, or health care provider.  2018 Elsevier/Gold Standard (2015-06-13 16:14:23)   IF you received an x-ray today, you will receive an invoice from [facility]. Please contact [facility] at [phone] with questions or concerns regarding your invoice.   IF you received labwork  today, you will receive an invoice from [facility]. Please contact [facility] at [phone] with questions or concerns regarding your invoice.   Our billing staff will not be able to assist you with questions regarding bills from these companies.  You will be contacted with the lab results as soon as they are available. The fastest way to get your results is to activate your My Chart account. Instructions are located on the last page of this paperwork. If you have not heard from [place] regarding the results in 2 weeks, please contact this office.

## 2017-06-09 ENCOUNTER — Ambulatory Visit: Payer: BLUE CROSS/BLUE SHIELD | Admitting: Physician Assistant

## 2017-06-16 ENCOUNTER — Ambulatory Visit
Admission: RE | Admit: 2017-06-16 | Discharge: 2017-06-16 | Disposition: A | Discharge status: Home / Self Care | Payer: BLUE CROSS/BLUE SHIELD | Source: Ambulatory Visit | Attending: Physician Assistant | Admitting: Physician Assistant

## 2017-06-16 DIAGNOSIS — Z1231 Encounter for screening mammogram for malignant neoplasm of breast: Secondary | ICD-10-CM

## 2017-06-17 ENCOUNTER — Other Ambulatory Visit: Payer: Self-pay | Admitting: Physician Assistant

## 2017-06-17 DIAGNOSIS — R928 Other abnormal and inconclusive findings on diagnostic imaging of breast: Secondary | ICD-10-CM

## 2017-06-24 ENCOUNTER — Other Ambulatory Visit: Payer: Self-pay | Admitting: Physician Assistant

## 2017-06-24 ENCOUNTER — Ambulatory Visit: Payer: BLUE CROSS/BLUE SHIELD

## 2017-06-24 ENCOUNTER — Ambulatory Visit
Admission: RE | Admit: 2017-06-24 | Discharge: 2017-06-24 | Disposition: A | Discharge status: Home / Self Care | Payer: BLUE CROSS/BLUE SHIELD | Source: Ambulatory Visit | Attending: Physician Assistant | Admitting: Physician Assistant

## 2017-06-24 DIAGNOSIS — R928 Other abnormal and inconclusive findings on diagnostic imaging of breast: Secondary | ICD-10-CM

## 2017-06-26 ENCOUNTER — Encounter (HOSPITAL_COMMUNITY): Payer: Self-pay | Admitting: Emergency Medicine

## 2017-06-26 DIAGNOSIS — R079 Chest pain, unspecified: Secondary | ICD-10-CM | POA: Insufficient documentation

## 2017-06-26 DIAGNOSIS — F17210 Nicotine dependence, cigarettes, uncomplicated: Secondary | ICD-10-CM | POA: Diagnosis not present

## 2017-06-26 DIAGNOSIS — Z79899 Other long term (current) drug therapy: Secondary | ICD-10-CM | POA: Diagnosis not present

## 2017-06-26 DIAGNOSIS — F419 Anxiety disorder, unspecified: Secondary | ICD-10-CM | POA: Diagnosis not present

## 2017-06-26 DIAGNOSIS — R002 Palpitations: Secondary | ICD-10-CM | POA: Insufficient documentation

## 2017-06-27 ENCOUNTER — Emergency Department (HOSPITAL_COMMUNITY): Payer: BLUE CROSS/BLUE SHIELD

## 2017-06-27 ENCOUNTER — Other Ambulatory Visit: Payer: Self-pay

## 2017-06-27 ENCOUNTER — Emergency Department (HOSPITAL_COMMUNITY)
Admission: EM | Admit: 2017-06-27 | Discharge: 2017-06-27 | Disposition: A | Discharge status: Home / Self Care | Payer: BLUE CROSS/BLUE SHIELD | Attending: Emergency Medicine | Admitting: Emergency Medicine

## 2017-06-27 ENCOUNTER — Encounter (HOSPITAL_COMMUNITY): Payer: Self-pay | Admitting: Emergency Medicine

## 2017-06-27 DIAGNOSIS — F419 Anxiety disorder, unspecified: Secondary | ICD-10-CM

## 2017-06-27 DIAGNOSIS — R002 Palpitations: Secondary | ICD-10-CM

## 2017-06-27 LAB — BASIC METABOLIC PANEL
ANION GAP: 6 (ref 5–15)
BUN: 17 mg/dL (ref 6–20)
CHLORIDE: 105 mmol/L (ref 101–111)
CO2: 26 mmol/L (ref 22–32)
CREATININE: 0.68 mg/dL (ref 0.44–1.00)
Calcium: 9.1 mg/dL (ref 8.9–10.3)
GFR calc non Af Amer: >60 mL/min (ref >60)
Glucose, Bld: 102 mg/dL — ABNORMAL HIGH (ref 65–99)
POTASSIUM: 4.2 mmol/L (ref 3.5–5.1)
SODIUM: 137 mmol/L (ref 135–145)

## 2017-06-27 LAB — CBC
HCT: 36.1 % (ref 36.0–46.0)
Hemoglobin: 12.4 g/dL (ref 12.0–15.0)
MCH: 32.3 pg (ref 26.0–34.0)
MCHC: 34.3 g/dL (ref 30.0–36.0)
MCV: 94 fL (ref 78.0–100.0)
PLATELETS: 175 10*3/uL (ref 150–400)
RBC: 3.84 MIL/uL — AB (ref 3.87–5.11)
RDW: 13.4 % (ref 11.5–15.5)
WBC: 8.4 10*3/uL (ref 4.0–10.5)

## 2017-06-27 LAB — POCT I-STAT TROPONIN I: Troponin i, poc: 0 ng/mL (ref 0.00–0.08)

## 2017-06-27 LAB — D-DIMER, QUANTITATIVE: D-Dimer, Quant: <0.27 ug/mL-FEU (ref 0.00–0.50)

## 2017-06-27 MED ORDER — SODIUM CHLORIDE 0.9 % IV BOLUS (SEPSIS): 500.0000 mL | Freq: Once | INTRAVENOUS | Status: DC

## 2017-06-27 MED ORDER — GI COCKTAIL ~~LOC~~
30.0000 mL | Freq: Once | ORAL | Status: AC
  Administered 2017-06-27: 30 mL via ORAL
  Filled 2017-06-27: qty 30

## 2017-06-27 NOTE — ED Provider Notes (Signed)
[facility] DEPT Provider Note   CSN: [id number] Arrival date & time: 06/26/17  2356     History   Chief Complaint Chief Complaint  Patient presents with  . Chest Pain  . Palpitations    HPI Donna Mcdowell is a 36 y.o. female.  The history is provided by the patient.  Palpitations   This is a new problem. The current episode started 3 to 5 hours ago. The problem occurs constantly. The problem has not changed since onset.The problem is associated with anxiety. Pertinent negatives include no diaphoresis, no exertional chest pressure, no irregular heartbeat, no nausea, no vomiting, no dizziness, no hemoptysis and no shortness of breath. Associated symptoms comments: Chest tightness feels hot all over.    Past Medical History:  Diagnosis Date  . Anxiety   . Breast pain in female    breast abscess  . Fatigue   . Ovarian cyst   . Panic attack   . 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 Wears glasses    wears contacts     Patient Active Problem List   Diagnosis Date Noted  . Localized swelling, mass or lump of neck 05/26/2017  . Cigarette nicotine dependence with nicotine-induced disorder 11/02/2016  . Encounter for smoking cessation counseling 11/02/2016  . Severe cervical dysplasia     Past Surgical History:  Procedure Laterality Date  . BREAST SURGERY Left    breast abscess, history of peircing on that breast  . CERVICAL BIOPSY  W/ LOOP ELECTRODE EXCISION  02/2013   CIN II-III  . HAND SURGERY      OB History    Gravida Para Term Preterm AB Living   0             SAB TAB Ectopic Multiple Live Births                   Home Medications    Prior to Admission medications   Medication Sig Start Date End Date Taking? Authorizing Provider  Aspirin-Acetaminophen-Caffeine (GOODY HEADACHE PO) Take 1 packet by mouth daily as needed (headache).   Yes [provider]  cetirizine (ZYRTEC) 10 MG tablet Take 10 mg by mouth daily.   Yes [provider]  fluticasone (FLONASE) 50 MCG/ACT nasal spray Place 2 sprays into both nostrils daily. Patient taking differently: Place 2 sprays into both nostrils daily as needed for allergies.  11/02/16  Yes McVey, Gelene Mink, PA-C  ibuprofen (ADVIL,MOTRIN) 200 MG tablet Take 200 mg by mouth every 6 (six) hours as needed.   Yes [provider]  varenicline (CHANTIX PAK) 0.5 MG X 11 & 1 MG X 42 tablet Take one 0.5 mg tablet by mouth once daily for 3 days, then increase to one 0.5 mg tablet twice daily for 4 days, then increase to one 1 mg tablet twice daily. 11/02/16  Yes McVey, Gelene Mink, PA-C  diclofenac (VOLTAREN) 75 MG EC tablet Take 1 tablet (75 mg total) by mouth 2 (two) times daily. Patient not taking: Reported on 05/19/2017 0/25/42   Chitanand, Elmo Putt B, DO  varenicline (CHANTIX CONTINUING MONTH PAK) 1 MG tablet Take 1 tablet (1 mg total) by mouth 2 (two) times daily. 06/02/17   McVey, Gelene Mink, PA-C    Family History Family History  Problem Relation Age of Onset  . Breast cancer Mother 11  . Cancer Mother        Breast  . Osteoarthritis Maternal Grandmother   . Cancer  Maternal Grandmother        Lung  . Osteoarthritis Maternal Grandfather   . Heart failure Maternal Grandfather     Social History Social History  Substance Use Topics  . Smoking status: Current Every Day Smoker    Packs/day: 1.00    Types: Cigarettes  . Smokeless tobacco: Never Used  . Alcohol use 6.0 oz/week    10 Cans of beer per week     Allergies   Oxycodone base   Review of Systems Review of Systems  Constitutional: Negative for diaphoresis.  Respiratory: Positive for chest tightness. Negative for hemoptysis and shortness of breath.   Cardiovascular: Positive for palpitations.  Gastrointestinal: Negative for nausea and vomiting.  Neurological: Negative for dizziness, facial asymmetry and light-headedness.  Psychiatric/Behavioral: The patient is nervous/anxious.     All other systems reviewed and are negative.    Physical Exam Updated Vital Signs BP 113/77 (BP Location: Right Arm)   Pulse 88   Temp 97.6 F (36.4 C) (Oral)   Resp (!) 24   Ht 5\' 4"  (1.626 m)   Wt 59 kg (130 lb)   LMP 06/02/2017   SpO2 99%   BMI 22.31 kg/m   Physical Exam  Constitutional: She is oriented to person, place, and time. She appears well-developed and well-nourished. No distress.  HENT:  Head: Normocephalic and atraumatic.  Mouth/Throat: No oropharyngeal exudate.  Eyes: Pupils are equal, round, and reactive to light. Conjunctivae are normal.  Neck: Normal range of motion. Neck supple. No JVD present.  Cardiovascular: Normal rate, regular rhythm, normal heart sounds and intact distal pulses.   Pulmonary/Chest: Effort normal and breath sounds normal. She has no wheezes. She has no rales.  Abdominal: Soft. Bowel sounds are normal. She exhibits no mass. There is no tenderness. There is no rebound and no guarding.  Musculoskeletal: Normal range of motion. She exhibits no edema, tenderness or deformity.  Lymphadenopathy:    She has no cervical adenopathy.  Neurological: She is alert and oriented to person, place, and time. She displays normal reflexes.  Skin: Skin is warm and dry. Capillary refill takes less than 2 seconds.  Psychiatric: She has a normal mood and affect.     ED Treatments / Results   Vitals:   06/27/17 0218 06/27/17 0330  BP: 113/77 118/73  Pulse: 88 81  Resp: (!) 24 18  Temp:    SpO2: 99% 97%    Labs (all labs ordered are listed, but only abnormal results are displayed)  Results for orders placed or performed during the hospital encounter of 11/91/47  Basic metabolic panel  Result Value Ref Range   Sodium 137 135 - 145 mmol/L   Potassium 4.2 3.5 - 5.1 mmol/L   Chloride 105 101 - 111 mmol/L   CO2 26 22 - 32 mmol/L   Glucose, Bld 102 (H) 65 - 99 mg/dL   BUN 17 6 - 20 mg/dL   Creatinine, Ser 0.68 0.44 - 1.00 mg/dL   Calcium 9.1 8.9  - 10.3 mg/dL   GFR calc non Af Amer >60 >60 mL/min   GFR calc Af Amer >60 >60 mL/min   Anion gap 6 5 - 15  CBC  Result Value Ref Range   WBC 8.4 4.0 - 10.5 K/uL   RBC 3.84 (L) 3.87 - 5.11 MIL/uL   Hemoglobin 12.4 12.0 - 15.0 g/dL   HCT 36.1 36.0 - 46.0 %   MCV 94.0 78.0 - 100.0 fL   MCH 32.3 26.0 -  34.0 pg   MCHC 34.3 30.0 - 36.0 g/dL   RDW 13.4 11.5 - 15.5 %   Platelets 175 150 - 400 K/uL  D-dimer, quantitative (not at [facility])  Result Value Ref Range   D-Dimer, Quant <0.27 0.00 - 0.50 ug/mL-FEU  POCT i-Stat troponin I  Result Value Ref Range   Troponin i, poc 0.00 0.00 - 0.08 ng/mL   Comment 3           Dg Chest 2 View  Result Date: 06/27/2017 CLINICAL DATA:  Chest pain. EXAM: CHEST  2 VIEW COMPARISON:  04/28/2016 FINDINGS: The cardiomediastinal contours are normal. The lungs are clear. Pulmonary vasculature is normal. No consolidation, pleural effusion, or pneumothorax. No acute osseous abnormalities are seen. IMPRESSION: No acute pulmonary process. Electronically Signed   By: Jeb Levering M.D.   On: 06/27/2017 00:34   Ct Soft Tissue Neck W Contrast  Result Date: 05/31/2017 CLINICAL DATA:  Submental lymphadenopathy for 1-2 months, unchanged with antibiotic therapy. History of smoking. EXAM: CT NECK WITH CONTRAST TECHNIQUE: Multidetector CT imaging of the neck was performed using the standard protocol following the bolus administration of intravenous contrast. CONTRAST:  22mL ISOVUE-300 IOPAMIDOL (ISOVUE-300) INJECTION 61% COMPARISON:  None. FINDINGS: Pharynx and larynx: Symmetric pharyngeal soft tissues without evidence of mass or inflammation. Unremarkable larynx. Salivary glands: No inflammation, mass, or stone. Thyroid: Unremarkable. Lymph nodes: Submental lymph nodes measure 7 mm in short axis on the right and 5 mm on the left. Level IIa lymph nodes measure up to 9 mm on the right and 8 mm on the left. Level III-V lymph nodes are slightly prominent bilaterally but measure no more  than 5 mm in short axis. Vascular: Major vascular structures of the neck appear patent. Limited intracranial: Unremarkable. Visualized orbits: Unremarkable. Mastoids and visualized paranasal sinuses: Trace left maxillary sinus mucosal thickening. Clear mastoid air cells. Skeleton: No acute osseous abnormality or suspicious osseous lesion. Upper chest: A 2-3 mm nodule in the apical right upper lobe is most likely benign given its small size and patient's age. Other: None. IMPRESSION: 1. Small submental lymph nodes as well as a slightly prominent number of subcentimeter lymph nodes elsewhere in the neck bilaterally, nonspecific. No lymph nodes are frankly enlarged by CT size criteria or demonstrate suspicious morphology. 2. No primary neck mass. Electronically Signed   By: Logan Bores M.D.   On: 05/31/2017 16:19   Mm Diag Breast Tomo Uni Right  Result Date: 06/24/2017 CLINICAL DATA:  Patient presents for additional views of the right breast as followup to a recent screening exam to evaluate a possible asymmetry over the upper breast on the MLO view.Family history breast cancer diagnosed in her mother at age 42. EXAM: 2D DIGITAL DIAGNOSTIC UNILATERAL RIGHT MAMMOGRAM WITH CAD AND ADJUNCT TOMO COMPARISON:  Previous exam(s). ACR Breast Density Category c: The breast tissue is heterogeneously dense, which may obscure small masses. FINDINGS: Spot compression MLO and true lateral tomographic images demonstrate no focal abnormality over the upper right breast. Mammographic images were processed with CAD. IMPRESSION: No focal abnormality over the upper right breast. RECOMMENDATION: Recommend beginning annual screening mammography at age 8 due to positive family history of breast cancer in her mother diagnosed at age 50. I have discussed the findings and recommendations with the patient. Results were also provided in writing at the conclusion of the visit. If applicable, a reminder letter will be sent to the patient  regarding the next appointment. BI-RADS CATEGORY  1: Negative. Electronically Signed  By: Marin Olp M.D.   On: 06/24/2017 13:15   Mm Screening Breast Tomo Bilateral  Result Date: 06/16/2017 CLINICAL DATA:  Screening. Baseline examination. EXAM: 2D DIGITAL SCREENING BILATERAL MAMMOGRAM WITH CAD AND ADJUNCT TOMO COMPARISON:  None. ACR Breast Density Category c: The breast tissue is heterogeneously dense, which may obscure small masses. FINDINGS: In the right breast, a possible asymmetry warrants further evaluation. In the left breast, no findings suspicious for malignancy. Images were processed with CAD. IMPRESSION: Further evaluation is suggested for possible asymmetry in the right breast. RECOMMENDATION: Diagnostic mammogram and possibly ultrasound of the right breast. (Code:[id number]) The patient will be contacted regarding the findings, and additional imaging will be scheduled. BI-RADS CATEGORY  0: Incomplete. Need additional imaging evaluation and/or prior mammograms for comparison. Electronically Signed   By: Margarette Canada M.D.   On: 06/16/2017 14:09    EKG  EKG Interpretation  Date/Time:  Sunday June 27 2017 00:03:24 EDT Ventricular Rate:  99 PR Interval:    QRS Duration: 86 QT Interval:  335 QTC Calculation: 430 R Axis:   43 Text Interpretation:  Sinus rhythm Confirmed by Dory Horn) on 06/27/2017 12:18:05 AM       Radiology Dg Chest 2 View  Result Date: 06/27/2017 CLINICAL DATA:  Chest pain. EXAM: CHEST  2 VIEW COMPARISON:  04/28/2016 FINDINGS: The cardiomediastinal contours are normal. The lungs are clear. Pulmonary vasculature is normal. No consolidation, pleural effusion, or pneumothorax. No acute osseous abnormalities are seen. IMPRESSION: No acute pulmonary process. Electronically Signed   By: Jeb Levering M.D.   On: 06/27/2017 00:34    Procedures Procedures (including critical care time)  Medications Ordered in ED Medications  sodium chloride 0.9 %  bolus 500 mL (not administered)  gi cocktail (Maalox,Lidocaine,Donnatal) (not administered)       Final Clinical Impressions(s) / ED Diagnoses  Ruled out for PE.  HEART score is 0.  I highly doubt ACS I suspect this is related to a greasy spicy large meal she ate and then began refluxing and started to panic.  She is improved post GI cocktail.  Strict return precautions given for  chest pain, dyspnea on exertion, new weakness or numbness changes in vision or speech,  Inability to tolerate liquids or food, changes in voice cough, altered mental status or any concerns. No signs of systemic illness or infection. The patient is nontoxic-appearing on exam and vital signs are within normal limits.    I have reviewed the triage vital signs and the nursing notes. Pertinent labs &imaging results that were available during my care of the patient were reviewed by me and considered in my medical decision making (see chart for details).  After history, exam, and medical workup I feel the patient has been appropriately medically screened and is safe for discharge home. Pertinent diagnoses were discussed with the patient. Patient was given return precautions.   New Prescriptions New Prescriptions   No medications on file     Delquan Poucher, MD 06/27/17 [id number]

## 2017-06-27 NOTE — ED Notes (Signed)
ED Provider at bedside. 

## 2017-06-27 NOTE — ED Triage Notes (Signed)
Patient is complaining of chest tightness and palpitations. Patient states this started 35 minutes ago. Patient has a hx of anxiety.

## 2017-09-08 ENCOUNTER — Encounter: Payer: Self-pay | Admitting: Obstetrics and Gynecology

## 2017-09-08 ENCOUNTER — Ambulatory Visit: Payer: BLUE CROSS/BLUE SHIELD | Admitting: Obstetrics and Gynecology

## 2017-09-23 ENCOUNTER — Encounter: Payer: Self-pay | Admitting: Obstetrics and Gynecology

## 2017-09-23 ENCOUNTER — Other Ambulatory Visit: Payer: Self-pay

## 2017-09-23 ENCOUNTER — Other Ambulatory Visit (HOSPITAL_COMMUNITY)
Admission: RE | Admit: 2017-09-23 | Discharge: 2017-09-23 | Disposition: A | Discharge status: Home / Self Care | Payer: BLUE CROSS/BLUE SHIELD | Source: Ambulatory Visit | Attending: Obstetrics and Gynecology | Admitting: Obstetrics and Gynecology

## 2017-09-23 ENCOUNTER — Other Ambulatory Visit: Payer: Self-pay | Admitting: Plastic Surgery

## 2017-09-23 ENCOUNTER — Ambulatory Visit: Payer: BLUE CROSS/BLUE SHIELD | Admitting: Obstetrics and Gynecology

## 2017-09-23 ENCOUNTER — Telehealth: Payer: Self-pay | Admitting: Obstetrics and Gynecology

## 2017-09-23 VITALS — BP 118/80 | HR 68 | Resp 14 | Ht 64.0 in | Wt 139.0 lb

## 2017-09-23 DIAGNOSIS — Z803 Family history of malignant neoplasm of breast: Secondary | ICD-10-CM

## 2017-09-23 DIAGNOSIS — Z124 Encounter for screening for malignant neoplasm of cervix: Secondary | ICD-10-CM

## 2017-09-23 DIAGNOSIS — Z01419 Encounter for gynecological examination (general) (routine) without abnormal findings: Secondary | ICD-10-CM | POA: Diagnosis not present

## 2017-09-23 NOTE — Telephone Encounter (Signed)
Patient was seen today for her aex. Patient called to report that her Mother did have genetic testing with negative results. No need to call patient unless you have questions.

## 2017-09-23 NOTE — Patient Instructions (Addendum)
EXERCISE AND DIET:  We recommended that you start or continue a regular exercise program for good health. Regular exercise means any activity that makes your heart beat faster and makes you sweat.  We recommend exercising at least 30 minutes per day at least 3 days a week, preferably 4 or 5.  We also recommend a diet low in fat and sugar.  Inactivity, poor dietary choices and obesity can cause diabetes, heart attack, stroke, and kidney damage, among others.    ALCOHOL AND SMOKING:  Women should limit their alcohol intake to no more than 7 drinks/beers/glasses of wine (combined, not each!) per week. Moderation of alcohol intake to this level decreases your risk of breast cancer and liver damage. And of course, no recreational drugs are part of a healthy lifestyle.  And absolutely no smoking or even second hand smoke. Most people know smoking can cause heart and lung diseases, but did you know it also contributes to weakening of your bones? Aging of your skin?  Yellowing of your teeth and nails?  CALCIUM AND VITAMIN D:  Adequate intake of calcium and Vitamin D are recommended.  The recommendations for exact amounts of these supplements seem to change often, but generally speaking 600 mg of calcium (either carbonate or citrate) and 800 units of Vitamin D per day seems prudent. Certain women may benefit from higher intake of Vitamin D.  If you are among these women, your doctor will have told you during your visit.    PAP SMEARS:  Pap smears, to check for cervical cancer or precancers,  have traditionally been done yearly, although recent scientific advances have shown that most women can have pap smears less often.  However, every woman still should have a physical exam from her gynecologist every year. It will include a breast check, inspection of the vulva and vagina to check for abnormal growths or skin changes, a visual exam of the cervix, and then an exam to evaluate the size and shape of the uterus and  ovaries.  And after 34 years of age, a rectal exam is indicated to check for rectal cancers. We will also provide age appropriate advice regarding health maintenance, like when you should have certain vaccines, screening for sexually transmitted diseases, bone density testing, colonoscopy, mammograms, etc.   MAMMOGRAMS:  All women over 40 years old should have a yearly mammogram. Many facilities now offer a "3D" mammogram, which may cost around $50 extra out of pocket. If possible,  we recommend you accept the option to have the 3D mammogram performed.  It both reduces the number of women who will be called back for extra views which then turn out to be normal, and it is better than the routine mammogram at detecting truly abnormal areas.    COLONOSCOPY:  Colonoscopy to screen for colon cancer is recommended for all women at age 50.  We know, you hate the idea of the prep.  We agree, BUT, having colon cancer and not knowing it is worse!!  Colon cancer so often starts as a polyp that can be seen and removed at colonscopy, which can quite literally save your life!  And if your first colonoscopy is normal and you have no family history of colon cancer, most women don't have to have it again for 10 years.  Once every ten years, you can do something that may end up saving your life, right?  We will be happy to help you get it scheduled when you are ready.    Be sure to check your insurance coverage so you understand how much it will cost.  It may be covered as a preventative service at no cost, but you should check your particular policy.      Breast Self-Awareness Breast self-awareness means being familiar with how your breasts look and feel. It involves checking your breasts regularly and reporting any changes to your health care provider. Practicing breast self-awareness is important. A change in your breasts can be a sign of a serious medical problem. Being familiar with how your breasts look and feel allows  you to find any problems early, when treatment is more likely to be successful. All women should practice breast self-awareness, including women who have had breast implants. How to do a breast self-exam One way to learn what is normal for your breasts and whether your breasts are changing is to do a breast self-exam. To do a breast self-exam: Look for Changes  1. Remove all the clothing above your waist. 2. Stand in front of a mirror in a room with good lighting. 3. Put your hands on your hips. 4. Push your hands firmly downward. 5. Compare your breasts in the mirror. Look for differences between them (asymmetry), such as: ? Differences in shape. ? Differences in size. ? Puckers, dips, and bumps in one breast and not the other. 6. Look at each breast for changes in your skin, such as: ? Redness. ? Scaly areas. 7. Look for changes in your nipples, such as: ? Discharge. ? Bleeding. ? Dimpling. ? Redness. ? A change in position. Feel for Changes  Carefully feel your breasts for lumps and changes. It is best to do this while lying on your back on the floor and again while sitting or standing in the shower or tub with soapy water on your skin. Feel each breast in the following way:  Place the arm on the side of the breast you are examining above your head.  Feel your breast with the other hand.  Start in the nipple area and make  inch (2 cm) overlapping circles to feel your breast. Use the pads of your three middle fingers to do this. Apply light pressure, then medium pressure, then firm pressure. The light pressure will allow you to feel the tissue closest to the skin. The medium pressure will allow you to feel the tissue that is a little deeper. The firm pressure will allow you to feel the tissue close to the ribs.  Continue the overlapping circles, moving downward over the breast until you feel your ribs below your breast.  Move one finger-width toward the center of the body.  Continue to use the  inch (2 cm) overlapping circles to feel your breast as you move slowly up toward your collarbone.  Continue the up and down exam using all three pressures until you reach your armpit.  Write Down What You Find  Write down what is normal for each breast and any changes that you find. Keep a written record with breast changes or normal findings for each breast. By writing this information down, you do not need to depend only on memory for size, tenderness, or location. Write down where you are in your menstrual cycle, if you are still menstruating. If you are having trouble noticing differences in your breasts, do not get discouraged. With time you will become more familiar with the variations in your breasts and more comfortable with the exam. How often should I examine my breasts? Examine   your breasts every month. If you are breastfeeding, the best time to examine your breasts is after a feeding or after using a breast pump. If you menstruate, the best time to examine your breasts is 5-7 days after your period is over. During your period, your breasts are lumpier, and it may be more difficult to notice changes. When should I see my health care provider? See your health care provider if you notice:  A change in shape or size of your breasts or nipples.  A change in the skin of your breast or nipples, such as a reddened or scaly area.  Unusual discharge from your nipples.  A lump or thick area that was not there before.  Pain in your breasts.  Anything that concerns you.  This information is not intended to replace advice given to you by your health care provider. Make sure you discuss any questions you have with your health care provider. Document Released: 09/28/2005 Document Revised: 03/05/2016 Document Reviewed: 08/18/2015 Elsevier Interactive Patient Education  2018 [employer].  Steps to Quit Smoking Smoking tobacco can be harmful to your health and can  affect almost every organ in your body. Smoking puts you, and those around you, at risk for developing many serious chronic diseases. Quitting smoking is difficult, but it is one of the best things that you can do for your health. It is never too late to quit. What are the benefits of quitting smoking? When you quit smoking, you lower your risk of developing serious diseases and conditions, such as:  Lung cancer or lung disease, such as COPD.  Heart disease.  Stroke.  Heart attack.  Infertility.  Osteoporosis and bone fractures.  Additionally, symptoms such as coughing, wheezing, and shortness of breath may get better when you quit. You may also find that you get sick less often because your body is stronger at fighting off colds and infections. If you are pregnant, quitting smoking can help to reduce your chances of having a baby of low birth weight. How do I get ready to quit? When you decide to quit smoking, create a plan to make sure that you are successful. Before you quit:  Pick a date to quit. Set a date within the next two weeks to give you time to prepare.  Write down the reasons why you are quitting. Keep this list in places where you will see it often, such as on your bathroom mirror or in your car or wallet.  Identify the people, places, things, and activities that make you want to smoke (triggers) and avoid them. Make sure to take these actions: ? Throw away all cigarettes at home, at work, and in your car. ? Throw away smoking accessories, such as Scientist, research (medical). ? Clean your car and make sure to empty the ashtray. ? Clean your home, including curtains and carpets.  Tell your family, friends, and coworkers that you are quitting. Support from your loved ones can make quitting easier.  Talk with your health care provider about your options for quitting smoking.  Find out what treatment options are covered by your health insurance.  What strategies can I use to  quit smoking? Talk with your healthcare provider about different strategies to quit smoking. Some strategies include:  Quitting smoking altogether instead of gradually lessening how much you smoke over a period of time. Research shows that quitting "cold [place]" is more successful than gradually quitting.  Attending in-person counseling to help you build problem-solving skills.  You are more likely to have success in quitting if you attend several counseling sessions. Even short sessions of 10 minutes can be effective.  Finding resources and support systems that can help you to quit smoking and remain smoke-free after you quit. These resources are most helpful when you use them often. They can include: ? Online chats with a Social worker. ? Telephone quitlines. ? Careers information officer. ? Support groups or group counseling. ? Text messaging programs. ? Mobile phone applications.  Taking medicines to help you quit smoking. (If you are pregnant or breastfeeding, talk with your health care provider first.) Some medicines contain nicotine and some do not. Both types of medicines help with cravings, but the medicines that include nicotine help to relieve withdrawal symptoms. Your health care provider may recommend: ? Nicotine patches, gum, or lozenges. ? Nicotine inhalers or sprays. ? Non-nicotine medicine that is taken by mouth.  Talk with your health care provider about combining strategies, such as taking medicines while you are also receiving in-person counseling. Using these two strategies together makes you more likely to succeed in quitting than if you used either strategy on its own. If you are pregnant or breastfeeding, talk with your health care provider about finding counseling or other support strategies to quit smoking. Do not take medicine to help you quit smoking unless told to do so by your health care provider. What things can I do to make it easier to quit? Quitting smoking might  feel overwhelming at first, but there is a lot that you can do to make it easier. Take these important actions:  Reach out to your family and friends and ask that they support and encourage you during this time. Call telephone quitlines, reach out to support groups, or work with a counselor for support.  Ask people who smoke to avoid smoking around you.  Avoid places that trigger you to smoke, such as bars, parties, or smoke-break areas at work.  Spend time around people who do not smoke.  Lessen stress in your life, because stress can be a smoking trigger for some people. To lessen stress, try: ? Exercising regularly. ? Deep-breathing exercises. ? Yoga. ? Meditating. ? Performing a body scan. This involves closing your eyes, scanning your body from head to toe, and noticing which parts of your body are particularly tense. Purposefully relax the muscles in those areas.  Download or purchase mobile phone or tablet apps (applications) that can help you stick to your quit plan by providing reminders, tips, and encouragement. There are many free apps, such as QuitGuide from the [employer] Office manager for Disease Control and Prevention). You can find other support for quitting smoking (smoking cessation) through smokefree.gov and other websites.  How will I feel when I quit smoking? Within the first 24 hours of quitting smoking, you may start to feel some withdrawal symptoms. These symptoms are usually most noticeable 2-3 days after quitting, but they usually do not last beyond 2-3 weeks. Changes or symptoms that you might experience include:  Mood swings.  Restlessness, anxiety, or irritation.  Difficulty concentrating.  Dizziness.  Strong cravings for sugary foods in addition to nicotine.  Mild weight gain.  Constipation.  Nausea.  Coughing or a sore throat.  Changes in how your medicines work in your body.  A depressed mood.  Difficulty sleeping (insomnia).  After the first 2-3  weeks of quitting, you may start to notice more positive results, such as:  Improved sense of smell and taste.  Decreased coughing and sore throat.  Slower heart rate.  Lower blood pressure.  Clearer skin.  The ability to breathe more easily.  Fewer sick days.  Quitting smoking is very challenging for most people. Do not get discouraged if you are not successful the first time. Some people need to make many attempts to quit before they achieve long-term success. Do your best to stick to your quit plan, and talk with your health care provider if you have any questions or concerns. This information is not intended to replace advice given to you by your health care provider. Make sure you discuss any questions you have with your health care provider. Document Released: 09/22/2001 Document Revised: 05/26/2016 Document Reviewed: 02/12/2015 Elsevier Interactive Patient Education  2017 [employer].

## 2017-09-23 NOTE — Progress Notes (Signed)
36 y.o. G0P0 MarriedCaucasianF here for annual exam.   Sexually active, married to female partner.  Mom with breast cancer at 76, she was considering getting BRCA testing. She will discuss this with her mother again. The patient started getting mammograms with her primary.  Period Cycle (Days): 30 Period Duration (Days): 5 day Period Pattern: Regular Menstrual Flow: Moderate, Heavy Menstrual Control: Tampon Menstrual Control Change Freq (Hours): changes tampon 4-6 hours  Dysmenorrhea: None  No LMP recorded.          Sexually active: Yes.    The current method of family planning is none ( female partner).    Exercising: Yes.    cardio/ weights  Smoker:  Yes, she tried to quit smoking on Chantix, got about 1/2 way through the plan. Plans to try again.   Health Maintenance: Pap:  01-05-14 WNL NEG HR HPV 09-02-15 WNL NEG HR HPV  History of abnormal Pap:  Yes LEEP for CIN II in 5/14 MMG:  06-24-17 right MMG MM WNL repeat at age 58 Colonoscopy:  Never BMD:   Never TDaP:  09-04-14 Gardasil: no     reports that she has been smoking cigarettes.  She has been smoking about 1.00 pack per day. she has never used smokeless tobacco. She reports that she drinks about 8.4 - 12.0 oz of alcohol per week. She reports that she does not use drugs. 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 Wife is a Corporate treasurer  Past Medical History:  Diagnosis Date  . Anxiety   . Breast pain in female    breast abscess  . Fatigue   . Ovarian cyst   . Panic attack   . 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 Wears glasses    wears contacts     Past Surgical History:  Procedure Laterality Date  . BREAST SURGERY Left    breast abscess, history of peircing on that breast  . CERVICAL BIOPSY  W/ LOOP ELECTRODE EXCISION  02/2013   CIN II-III  . HAND SURGERY      Current Outpatient Medications  Medication Sig Dispense Refill  . Aspirin-Acetaminophen-Caffeine (GOODY  HEADACHE PO) Take 1 packet by mouth daily as needed (headache).    . cetirizine (ZYRTEC) 10 MG tablet Take 10 mg by mouth daily.    . diclofenac (VOLTAREN) 75 MG EC tablet Take 1 tablet (75 mg total) by mouth 2 (two) times daily. 30 tablet 0  . fluticasone (FLONASE) 50 MCG/ACT nasal spray Place 2 sprays into both nostrils daily. (Patient taking differently: Place 2 sprays into both nostrils daily as needed for allergies. ) 16 g 6  . ibuprofen (ADVIL,MOTRIN) 200 MG tablet Take 200 mg by mouth every 6 (six) hours as needed.    . varenicline (CHANTIX CONTINUING MONTH PAK) 1 MG tablet Take 1 tablet (1 mg total) by mouth 2 (two) times daily. (Patient not taking: Reported on 09/23/2017) 60 tablet 0  . varenicline (CHANTIX PAK) 0.5 MG X 11 & 1 MG X 42 tablet Take one 0.5 mg tablet by mouth once daily for 3 days, then increase to one 0.5 mg tablet twice daily for 4 days, then increase to one 1 mg tablet twice daily. (Patient not taking: Reported on 09/23/2017) 53 tablet 0   No current facility-administered medications for this visit.     Family History  Problem Relation Age of Onset  . Breast cancer Mother 106  . Cancer Mother  Breast  . Osteoarthritis Maternal Grandmother   . Cancer Maternal Grandmother        Lung  . Osteoarthritis Maternal Grandfather   . Heart failure Maternal Grandfather     Review of Systems  Constitutional: Negative.   HENT: Negative.   Eyes: Negative.   Respiratory: Negative.   Cardiovascular: Negative.   Gastrointestinal: Negative.   Endocrine: Negative.   Genitourinary: Negative.   Musculoskeletal: Negative.   Skin: Negative.   Allergic/Immunologic: Negative.   Neurological: Negative.   Psychiatric/Behavioral: Negative.     Exam:   BP 118/80 (BP Location: Right Arm, Patient Position: Sitting, Cuff Size: Normal)   Pulse 68   Resp 14   Ht 5' 4"  (1.626 m)   Wt 139 lb (63 kg)   BMI 23.86 kg/m   Weight change: @WEIGHTCHANGE @ Height:   Height: 5' 4"   (162.6 cm)  Ht Readings from Last 3 Encounters:  09/23/17 5' 4"  (1.626 m)  06/27/17 5' 4"  (1.626 m)  06/02/17 5' 4.25" (1.632 m)    General appearance: alert, cooperative and appears stated age Head: Normocephalic, without obvious abnormality, atraumatic Neck: no adenopathy, supple, symmetrical, trachea midline and thyroid normal to inspection and palpation Lungs: clear to auscultation bilaterally Cardiovascular: regular rate and rhythm Breasts: normal appearance, no masses or tenderness Abdomen: soft, non-tender; non distended,  no masses,  no organomegaly Extremities: extremities normal, atraumatic, no cyanosis or edema Skin: Skin color, texture, turgor normal. No rashes or lesions Lymph nodes: Cervical, supraclavicular, and axillary nodes normal. No abnormal inguinal nodes palpated Neurologic: Grossly normal   Pelvic: External genitalia:  no lesions              Urethra:  normal appearing urethra with no masses, tenderness or lesions              Bartholins and Skenes: normal                 Vagina: normal appearing vagina with normal color and discharge, no lesions              Cervix: no lesions               Bimanual Exam:  Uterus:  normal size, contour, position, consistency, mobility, non-tender              Adnexa: no mass, fullness, tenderness               Rectovaginal: Confirms               Anus:  normal sphincter tone, no lesions  Chaperone was present for exam.  A:  Well Woman with normal exam  Family history of breast cancer in her mother at 14. Mother is considering BRCA testing  H/O LEEP  P:   Pap with hpv  She had a mammogram this fall  Discussed genetic counseling, discussed possible MRI's   Discussed breast self exam  Discussed calcium and vit D intake  Normal labs with her primary  Given information on smoking cessation from [facility]  Discussed decreasing ETOH use

## 2017-09-23 NOTE — Telephone Encounter (Signed)
Routing to [place] for further review.

## 2017-09-27 LAB — CYTOLOGY - PAP
Diagnosis: NEGATIVE
HPV: NOT DETECTED

## 2017-09-29 ENCOUNTER — Telehealth: Payer: Self-pay | Admitting: Physician Assistant

## 2017-09-29 NOTE — Telephone Encounter (Signed)
Copied from [place] [phone]. Topic: Quick Communication - See Telephone Encounter >> Sep 29, 2017  1:19 PM Boyd Kerbs wrote: CRM for notification. See Telephone encounter for:  Regards chantex prescription she is on,  asking if can call in another starter pack Chantex.  09/29/17.

## 2017-10-01 ENCOUNTER — Other Ambulatory Visit: Payer: Self-pay | Admitting: Physician Assistant

## 2017-10-01 ENCOUNTER — Telehealth: Payer: Self-pay | Admitting: *Deleted

## 2017-10-01 DIAGNOSIS — Z716 Tobacco abuse counseling: Secondary | ICD-10-CM

## 2017-10-01 MED ORDER — VARENICLINE TARTRATE 0.5 MG X 11 & 1 MG X 42 PO MISC: ORAL | 0 refills | Status: DC

## 2017-10-01 NOTE — Telephone Encounter (Signed)
Done

## 2017-10-01 NOTE — Telephone Encounter (Signed)
[facility] rx ready for pickup at the [facility] [address]

## 2017-10-13 ENCOUNTER — Other Ambulatory Visit: Payer: Self-pay | Admitting: Physician Assistant

## 2017-10-13 DIAGNOSIS — F17219 Nicotine dependence, cigarettes, with unspecified nicotine-induced disorders: Secondary | ICD-10-CM

## 2017-10-13 DIAGNOSIS — Z716 Tobacco abuse counseling: Secondary | ICD-10-CM

## 2017-10-14 NOTE — Telephone Encounter (Signed)
Chantix refill. Last office visit 06/02/17. Thanks.

## 2017-10-14 NOTE — Telephone Encounter (Signed)
Please advise 

## 2018-01-11 ENCOUNTER — Ambulatory Visit: Payer: Self-pay | Admitting: Physician Assistant

## 2018-01-21 ENCOUNTER — Ambulatory Visit: Payer: Self-pay | Admitting: Physician Assistant

## 2018-01-26 ENCOUNTER — Encounter: Payer: Self-pay | Admitting: Physician Assistant

## 2018-02-09 ENCOUNTER — Other Ambulatory Visit: Payer: Self-pay

## 2018-02-09 ENCOUNTER — Encounter: Payer: Self-pay | Admitting: Physician Assistant

## 2018-02-09 ENCOUNTER — Ambulatory Visit (INDEPENDENT_AMBULATORY_CARE_PROVIDER_SITE_OTHER): Payer: Self-pay | Admitting: Physician Assistant

## 2018-02-09 VITALS — BP 110/82 | HR 76 | Temp 98.6°F | Resp 16 | Ht 63.0 in | Wt 145.0 lb

## 2018-02-09 DIAGNOSIS — Z8709 Personal history of other diseases of the respiratory system: Secondary | ICD-10-CM

## 2018-02-09 DIAGNOSIS — J302 Other seasonal allergic rhinitis: Secondary | ICD-10-CM

## 2018-02-09 NOTE — Patient Instructions (Addendum)
  You will receive a phone call in the next 1-2 weeks to schedule an appointment with ENT and allergy.  Consider acupuncture for allergies. (Edon are good) Try switching up the allergy medication you take.     Thank you for coming in today. I hope you feel we met your needs.  Feel free to call PCP if you have any questions or further requests.  Please consider signing up for MyChart if you do not already have it, as this is a great way to communicate with me.  Best,  Whitney McVey, PA-C  IF you received an x-ray today, you will receive an invoice from [facility]. Please contact [facility] at [phone] with questions or concerns regarding your invoice.   IF you received labwork today, you will receive an invoice from [facility]. Please contact [facility] at [phone] with questions or concerns regarding your invoice.   Our billing staff will not be able to assist you with questions regarding bills from these companies.  You will be contacted with the lab results as soon as they are available. The fastest way to get your results is to activate your My Chart account. Instructions are located on the last page of this paperwork. If you have not heard from [place] regarding the results in 2 weeks, please contact this office.

## 2018-02-09 NOTE — Progress Notes (Signed)
Donna Mcdowell  MRN: [id number] DOB: 1980/12/25  PCP: Elmarie Shiley, MD  Subjective:  Pt is a pleasant 37 year old female who presents to clinic for several complaints.   "My breathing issue" - she has a h/o not breathing well from both nostrils. Right is worse than left. Symptoms are worsened by allergies. She has never been evaluated by ENT, however would like a referral today to discuss possibility of deviated septum. History of snoring - denies episodes of apnea. She has tried nasal strips at night while sleeping, did not like them  Allergies - she would like referral to allergies for seasonal and possible food allergy. She believes she possibly has food allergies - after eating certain foods she "feels bogged down". Pt has not noticed any foods in particular that cause symptoms. She thinks she may be allergic to shrimp, endorses tingling in throat. Denies rash, shob, difficulty breathing, cough, tongue or throat swelling, diarrhea, nausea, vomiting.   Seasonal allergies - she takes Ibuprofen and zyrtec. She has a h/o cat allergy. Pt and her wife have cats in the home.   She is still smoking. Chantix did not work.  Plans to try Nicorette.   Review of Systems  HENT: Positive for congestion, postnasal drip and rhinorrhea. Negative for sinus pressure, sinus pain, sneezing and sore throat.   Respiratory: Negative for apnea and cough.   Skin: Negative for rash.  Allergic/Immunologic: Positive for environmental allergies and food allergies.  Neurological: Negative for dizziness, light-headedness and headaches.    Patient Active Problem List   Diagnosis Date Noted  . Localized swelling, mass or lump of neck 05/26/2017  . Cigarette nicotine dependence with nicotine-induced disorder 11/02/2016  . Encounter for smoking cessation counseling 11/02/2016  . Severe cervical dysplasia     Current Outpatient Medications on File Prior to Visit  Medication Sig Dispense Refill  .  Aspirin-Acetaminophen-Caffeine (GOODY HEADACHE PO) Take 1 packet by mouth daily as needed (headache).    . cetirizine (ZYRTEC) 10 MG tablet Take 10 mg by mouth daily.    Marland Kitchen ibuprofen (ADVIL,MOTRIN) 200 MG tablet Take 200 mg by mouth every 6 (six) hours as needed.    Hendricks Limes CONTINUING MONTH PAK 1 MG tablet TAKE 1 TABLET(1 MG) BY MOUTH TWICE DAILY (Patient not taking: Reported on 02/09/2018) 56 tablet 0  . diclofenac (VOLTAREN) 75 MG EC tablet Take 1 tablet (75 mg total) by mouth 2 (two) times daily. (Patient not taking: Reported on 02/09/2018) 30 tablet 0  . fluticasone (FLONASE) 50 MCG/ACT nasal spray Place 2 sprays into both nostrils daily. (Patient not taking: Reported on 02/09/2018) 16 g 6  . varenicline (CHANTIX STARTING MONTH PAK) 0.5 MG X 11 & 1 MG X 42 tablet TAKE AS DIRECTED (Patient not taking: Reported on 02/09/2018) 53 tablet 0   No current facility-administered medications on file prior to visit.     Allergies  Allergen Reactions  . Oxycodone Base Nausea And Vomiting     Objective:  BP 110/82 (BP Location: Left Arm, Patient Position: Sitting, Cuff Size: Normal)   Pulse 76   Temp 98.6 F (37 C) (Oral)   Resp 16   Ht 5\' 3"  (1.6 m)   Wt 145 lb (65.8 kg)   SpO2 96%   BMI 25.69 kg/m   Physical Exam  Constitutional: She is oriented to person, place, and time. No distress.  HENT:  B/l nares patent, however partially obstructed, R>L  Neurological: She is alert and oriented to person,  place, and time.  Skin: Skin is warm and dry.  Psychiatric: Judgment normal.  Vitals reviewed.   Assessment and Plan :  1. History of nasal obstruction - Ambulatory referral to ENT - Pt c/o long history of partial nasal obstruction and would like referral to ENT for evaluation. Referral placed.  2. Seasonal allergies - Ambulatory referral to Allergy - Pt endorses h/o seasonal allergies and is concerned for possible food allergy. Plan to refer to alergy for eval.   Mercer Pod, PA-C  [facility] 02/09/2018 4:39 PM

## 2018-02-23 ENCOUNTER — Ambulatory Visit (INDEPENDENT_AMBULATORY_CARE_PROVIDER_SITE_OTHER): Payer: 59 | Admitting: Allergy & Immunology

## 2018-02-23 ENCOUNTER — Encounter: Payer: Self-pay | Admitting: Allergy & Immunology

## 2018-02-23 VITALS — BP 112/70 | HR 78 | Temp 97.6°F | Resp 18 | Ht 63.5 in | Wt 140.4 lb

## 2018-02-23 DIAGNOSIS — J302 Other seasonal allergic rhinitis: Secondary | ICD-10-CM

## 2018-02-23 DIAGNOSIS — F458 Other somatoform disorders: Secondary | ICD-10-CM

## 2018-02-23 DIAGNOSIS — R0989 Other specified symptoms and signs involving the circulatory and respiratory systems: Secondary | ICD-10-CM

## 2018-02-23 DIAGNOSIS — J3089 Other allergic rhinitis: Secondary | ICD-10-CM | POA: Diagnosis not present

## 2018-02-23 MED ORDER — MONTELUKAST SODIUM 10 MG PO TABS: 10.0000 mg | ORAL_TABLET | Freq: Every day | ORAL | 5 refills | Status: DC

## 2018-02-23 MED ORDER — AZELASTINE-FLUTICASONE 137-50 MCG/ACT NA SUSP: 2.0000 | Freq: Two times a day (BID) | NASAL | 5 refills | Status: DC | PRN

## 2018-02-23 MED ORDER — FEXOFENADINE HCL 180 MG PO TABS: 180.0000 mg | ORAL_TABLET | Freq: Every day | ORAL | 5 refills | Status: DC

## 2018-02-23 NOTE — Progress Notes (Signed)
NEW PATIENT  Date of Service/Encounter:  02/23/18  Referring provider: Elmarie Shiley, MD   Assessment:   Seasonal and perennial allergic rhinitis  Globus sensation  Plan/Recommendations:   1. Seasonal and perennial allergic rhinitis - with globus sensation likely secondary to  - We will get blood work to look for environmental allergies. - We will call you in 1-2 weeks with the results of the testing. - Stop taking: Zyrtec - Start taking: Allegra (fexofenadine) 180mg  table once daily, Singulair (montelukast) 10mg  daily and Dymista (fluticasone/azelastine) two sprays per nostril 1-2 times daily as needed  - You can use Mucinex as needed to help thin out the mucous in your nasal passage.  - You can use an extra dose of the antihistamine, if needed, for breakthrough symptoms.  - Consider nasal saline rinses 1-2 times daily to remove allergens from the nasal cavities as well as help with mucous clearance (this is especially helpful to do before the nasal sprays are given) - Consider allergy shots as a means of long-term control. - Allergy shots "re-train" and "reset" the immune system to ignore environmental allergens and decrease the resulting immune response to those allergens (sneezing, itchy watery eyes, runny nose, nasal congestion, etc).    - Allergy shots improve symptoms in 75-85% of patients.   2. Return in about 6 months (around 08/26/2018).  Subjective:   Donna Mcdowell is a 37 y.o. female presenting today for evaluation of  Chief Complaint  Patient presents with  . Allergy Testing    pt never has been tested for food allergies    Donna Mcdowell has a history of the following: Patient Active Problem List   Diagnosis Date Noted  . Localized swelling, mass or lump of neck 05/26/2017  . Cigarette nicotine dependence with nicotine-induced disorder 11/02/2016  . Encounter for smoking cessation counseling 11/02/2016  . Severe cervical dysplasia      History obtained from: chart review and patient.  Donna Mcdowell was referred by Elmarie Shiley, MD.     Donna Mcdowell is a 37 y.o. female presenting for an evaluation of allergies. She did have testing performed as a child. She has problems with environmental allergies. She is on cetirizine but this causes dryness. She continues to have ocular itching as well as since pressure. She has never used a nose spray, but this was in the distant past during illnesses only. Symptoms persist throughout the year. She does feel better when it is colder outside. She is unsure when she was tested, but she did come here for her shots years ago. She does not remember what she was positive to, but it was definitely dust mites, molds, mildew, weeds, grass, cats, amongst other things. She is unsure of the duration of the shots. She just got tired of receiving them.   She has no history of asthma or food allergies.  She is able to tolerate all of the major food allergens without adverse event.  She does have some GERD with certain foods such as spicy foods. She does not pay attention to the exact triggers. She does report problems with certain sugary or salty foods and she feels a "film" in her throat. She does drink water but does not feel that she drinks enough liquid while she eats. She denies any problem with food impaction.  Otherwise, there is no history of other atopic diseases, including asthma, drug allergies, food allergies, stinging insect allergies, or urticaria. There is no significant infectious history. Vaccinations are  up to date.    Past Medical History: Patient Active Problem List   Diagnosis Date Noted  . Localized swelling, mass or lump of neck 05/26/2017  . Cigarette nicotine dependence with nicotine-induced disorder 11/02/2016  . Encounter for smoking cessation counseling 11/02/2016  . Severe cervical dysplasia     Medication List:  Allergies as of 02/23/2018      Reactions    Oxycodone Base Nausea And Vomiting      Medication List        Accurate as of 02/23/18  9:37 PM. Always use your most recent med list.          Azelastine-Fluticasone 137-50 MCG/ACT Susp Commonly known as:  DYMISTA Place 2 sprays into both nostrils 2 (two) times daily as needed.   cetirizine 10 MG tablet Commonly known as:  ZYRTEC Take 10 mg by mouth daily.   CHANTIX CONTINUING MONTH PAK 1 MG tablet Generic drug:  varenicline TAKE 1 TABLET(1 MG) BY MOUTH TWICE DAILY   varenicline 0.5 MG X 11 & 1 MG X 42 tablet Commonly known as:  CHANTIX STARTING MONTH PAK TAKE AS DIRECTED   diclofenac 75 MG EC tablet Commonly known as:  VOLTAREN Take 1 tablet (75 mg total) by mouth 2 (two) times daily.   fexofenadine 180 MG tablet Commonly known as:  ALLEGRA Take 1 tablet (180 mg total) by mouth daily.   fluticasone 50 MCG/ACT nasal spray Commonly known as:  FLONASE Place 2 sprays into both nostrils daily.   GOODY HEADACHE PO Take 1 packet by mouth daily as needed (headache).   Aspirin-Acetaminophen-Caffeine 500-325-65 MG Pack Take by mouth.   ibuprofen 200 MG tablet Commonly known as:  ADVIL,MOTRIN Take 200 mg by mouth every 6 (six) hours as needed.   montelukast 10 MG tablet Commonly known as:  SINGULAIR Take 1 tablet (10 mg total) by mouth at bedtime.       Birth History: non-contributory.   Developmental History: non-contributory.   Past Surgical History: Past Surgical History:  Procedure Laterality Date  . BREAST SURGERY Left    breast abscess, history of peircing on that breast  . CERVICAL BIOPSY  W/ LOOP ELECTRODE EXCISION  02/2013   CIN II-III  . HAND SURGERY    . MOLE REMOVAL     moles removed from left leg and left upper abdomen      Family History: Family History  Problem Relation Age of Onset  . Breast cancer Mother 28  . Cancer Mother        Breast  . Osteoarthritis Maternal Grandmother   . Cancer Maternal Grandmother        Lung  .  Osteoarthritis Maternal Grandfather   . Heart failure Maternal Grandfather   . Allergic rhinitis Neg Hx   . Angioedema Neg Hx   . Asthma Neg Hx   . Atopy Neg Hx   . Eczema Neg Hx   . Immunodeficiency Neg Hx   . Urticaria Neg Hx      Social History: Donna Mcdowell lives at home with her wife. They live in a house that was built in 1962. They have carpeting in the main living areas and wood in the bedrooms. They have electric heating and central cooling. There are two dogs and one cat in the home. There are no dust mite coverings on the bedding at all. There is tobacco exposure in the car. She has been a smoker since 2000; she smokes one pack per day.  Review of Systems: a 14-point review of systems is pertinent for what is mentioned in HPI.  Otherwise, all other systems were negative. Constitutional: negative other than that listed in the HPI Eyes: negative other than that listed in the HPI Ears, nose, mouth, throat, and face: negative other than that listed in the HPI Respiratory: negative other than that listed in the HPI Cardiovascular: negative other than that listed in the HPI Gastrointestinal: negative other than that listed in the HPI Genitourinary: negative other than that listed in the HPI Integument: negative other than that listed in the HPI Hematologic: negative other than that listed in the HPI Musculoskeletal: negative other than that listed in the HPI Neurological: negative other than that listed in the HPI Allergy/Immunologic: negative other than that listed in the HPI    Objective:   Blood pressure 112/70, pulse 78, temperature 97.6 F (36.4 C), resp. rate 18, height 5' 3.5" (1.613 m), weight 140 lb 6.4 oz (63.7 kg), SpO2 96 %. Body mass index is 24.48 kg/m.   Physical Exam:  General: Alert, interactive, in no acute distress. Pleasant and smiling. Multiple tattoos. Eyes: No conjunctival injection bilaterally, no discharge on the right, no discharge on the left  and no Horner-Trantas dots present. PERRL bilaterally. EOMI without pain. No photophobia.  Ears: Right TM pearly gray with normal light reflex, Left TM pearly gray with normal light reflex, Right TM intact without perforation and Left TM intact without perforation.  Nose/Throat: External nose within normal limits and septum midline. Turbinates edematous and pale with clear discharge. Posterior oropharynx erythematous with cobblestoning in the posterior oropharynx. Tonsils 3+ without exudates.  Tongue without thrush. Neck: Supple without thyromegaly. Trachea midline. Adenopathy: no enlarged lymph nodes appreciated in the anterior cervical, occipital, axillary, epitrochlear, inguinal, or popliteal regions. Lungs: Clear to auscultation without wheezing, rhonchi or rales. No increased work of breathing. CV: Normal S1/S2. No murmurs. Capillary refill <2 seconds.  Skin: Warm and dry, without lesions or rashes. Extremities:  No clubbing, cyanosis or edema. Neuro:   Grossly intact. No focal deficits appreciated. Responsive to questions.  Diagnostic studies: none (deferred due to patient preference)       Salvatore Marvel, MD [facility] [place] [facility]

## 2018-02-23 NOTE — Patient Instructions (Addendum)
1. Seasonal and perennial allergic rhinitis - We will get blood work to look for environmental allergies. - We will call you in 1-2 weeks with the results of the testing. - Stop taking: Zyrtec - Start taking: Allegra (fexofenadine) 180mg  table once daily, Singulair (montelukast) 10mg  daily and Dymista (fluticasone/azelastine) two sprays per nostril 1-2 times daily as needed  - You can use Mucinex as needed to help thin out the mucous in your nasal passage.  - You can use an extra dose of the antihistamine, if needed, for breakthrough symptoms.  - Consider nasal saline rinses 1-2 times daily to remove allergens from the nasal cavities as well as help with mucous clearance (this is especially helpful to do before the nasal sprays are given) - Consider allergy shots as a means of long-term control. - Allergy shots "re-train" and "reset" the immune system to ignore environmental allergens and decrease the resulting immune response to those allergens (sneezing, itchy watery eyes, runny nose, nasal congestion, etc).    - Allergy shots improve symptoms in 75-85% of patients.   2. Return in about 6 months (around 08/26/2018).   Please inform us of any Emergency Department visits, hospitalizations, or changes in symptoms. Call us before going to the ED for breathing or allergy symptoms since we might be able to fit you in for a sick visit. Feel free to contact us anytime with any questions, problems, or concerns.  It was a pleasure to meet you today!  Websites that have reliable patient information: 1. American Academy of Asthma, Allergy, and Immunology: www.aaaai.org 2. Food Allergy Research and Education (FARE): foodallergy.org 3. Mothers of Asthmatics: http://www.asthmacommunitynetwork.org 4. American College of Allergy, Asthma, and Immunology: www.acaai.org  Allergy Shots   Allergies are the result of a chain reaction that starts in the immune system. Your immune system controls how your body  defends itself. For instance, if you have an allergy to pollen, your immune system identifies pollen as an invader or allergen. Your immune system overreacts by producing antibodies called Immunoglobulin E (IgE). These antibodies travel to cells that release chemicals, causing an allergic reaction.  The concept behind allergy immunotherapy, whether it is received in the form of shots or tablets, is that the immune system can be desensitized to specific allergens that trigger allergy symptoms. Although it requires time and patience, the payback can be long-term relief.  How Do Allergy Shots Work?  Allergy shots work much like a vaccine. Your body responds to injected amounts of a particular allergen given in increasing doses, eventually developing a resistance and tolerance to it. Allergy shots can lead to decreased, minimal or no allergy symptoms.  There generally are two phases: build-up and maintenance. Build-up often ranges from three to six months and involves receiving injections with increasing amounts of the allergens. The shots are typically given once or twice a week, though more rapid build-up schedules are sometimes used.  The maintenance phase begins when the most effective dose is reached. This dose is different for each person, depending on how allergic you are and your response to the build-up injections. Once the maintenance dose is reached, there are longer periods between injections, typically two to four weeks.  Occasionally doctors give cortisone-type shots that can temporarily reduce allergy symptoms. These types of shots are different and should not be confused with allergy immunotherapy shots.  Who Can Be Treated with Allergy Shots?  Allergy shots may be a good treatment approach for people with allergic rhinitis (hay fever), allergic asthma,  conjunctivitis (eye allergy) or stinging insect allergy.   Before deciding to begin allergy shots, you should consider:  . The length  of allergy season and the severity of your symptoms . Whether medications and/or changes to your environment can control your symptoms . Your desire to avoid long-term medication use . Time: allergy immunotherapy requires a major time commitment . Cost: may vary depending on your insurance coverage  Allergy shots for children age 72 and older are effective and often well tolerated. They might prevent the onset of new allergen sensitivities or the progression to asthma.  Allergy shots are not started on patients who are pregnant but can be continued on patients who become pregnant while receiving them. In some patients with other medical conditions or who take certain common medications, allergy shots may be of risk. It is important to mention other medications you talk to your allergist.   When Will I Feel Better?  Some may experience decreased allergy symptoms during the build-up phase. For others, it may take as long as 12 months on the maintenance dose. If there is no improvement after a year of maintenance, your allergist will discuss other treatment options with you.  If you aren't responding to allergy shots, it may be because there is not enough dose of the allergen in your vaccine or there are missing allergens that were not identified during your allergy testing. Other reasons could be that there are high levels of the allergen in your environment or major exposure to non-allergic triggers like tobacco smoke.  What Is the Length of Treatment?  Once the maintenance dose is reached, allergy shots are generally continued for three to five years. The decision to stop should be discussed with your allergist at that time. Some people may experience a permanent reduction of allergy symptoms. Others may relapse and a longer course of allergy shots can be considered.  What Are the Possible Reactions?  The two types of adverse reactions that can occur with allergy shots are local and systemic.  Common local reactions include very mild redness and swelling at the injection site, which can happen immediately or several hours after. A systemic reaction, which is less common, affects the entire body or a particular body system. They are usually mild and typically respond quickly to medications. Signs include increased allergy symptoms such as sneezing, a stuffy nose or hives.  Rarely, a serious systemic reaction called anaphylaxis can develop. Symptoms include swelling in the throat, wheezing, a feeling of tightness in the chest, nausea or dizziness. Most serious systemic reactions develop within 30 minutes of allergy shots. This is why it is strongly recommended you wait in your doctor's office for 30 minutes after your injections. Your allergist is trained to watch for reactions, and his or her staff is trained and equipped with the proper medications to identify and treat them.  Who Should Administer Allergy Shots?  The preferred location for receiving shots is your prescribing allergist's office. Injections can sometimes be given at another facility where the physician and staff are trained to recognize and treat reactions, and have received instructions by your prescribing allergist.

## 2018-03-01 LAB — IGE+ALLERGENS ZONE 2(30)
Bahia Grass IgE: <0.1 kU/L
Bermuda Grass IgE: <0.1 kU/L
Cat Dander IgE: <0.1 kU/L
Cladosporium Herbarum IgE: <0.1 kU/L
Common Silver Birch IgE: <0.1 kU/L
D Pteronyssinus IgE: <0.1 kU/L
Elm, American IgE: <0.1 kU/L
Hickory, White IgE: <0.1 kU/L
IgE (Immunoglobulin E), Serum: 6 IU/mL (ref 6–495)
Johnson Grass IgE: <0.1 kU/L
Mugwort IgE Qn: <0.1 kU/L
Nettle IgE: <0.1 kU/L
Pigweed, Rough IgE: <0.1 kU/L
Ragweed, Short IgE: <0.1 kU/L
Stemphylium Herbarum IgE: <0.1 kU/L
Timothy Grass IgE: <0.1 kU/L
White Mulberry IgE: <0.1 kU/L

## 2018-03-03 ENCOUNTER — Encounter: Payer: Self-pay | Admitting: Allergy & Immunology

## 2018-03-03 ENCOUNTER — Ambulatory Visit (INDEPENDENT_AMBULATORY_CARE_PROVIDER_SITE_OTHER): Payer: 59 | Admitting: Allergy & Immunology

## 2018-03-03 VITALS — BP 100/70 | HR 80 | Resp 20

## 2018-03-03 DIAGNOSIS — J302 Other seasonal allergic rhinitis: Secondary | ICD-10-CM

## 2018-03-03 DIAGNOSIS — J3089 Other allergic rhinitis: Secondary | ICD-10-CM | POA: Diagnosis not present

## 2018-03-03 NOTE — Progress Notes (Signed)
FOLLOW UP  Date of Service/Encounter:  03/03/18   Assessment:   Seasonal and perennial allergic rhinitis (trees, weeds, grasses, indoor molds, outdoor molds, dust mites, cat and dog)  Plan/Recommendations:   1. Seasonal and perennial allergic rhinitis - Testing today showed: trees, weeds, grasses, indoor molds, outdoor molds, dust mites, cat and dog - Avoidance measures provided. - Continue with: Allegra (fexofenadine) 180mg  table once daily, Singulair (montelukast) 10mg  daily and Dymista (fluticasone/azelastine) two sprays per nostril 1-2 times daily as needed - You can use an extra dose of the antihistamine, if needed, for breakthrough symptoms.  - Consider nasal saline rinses 1-2 times daily to remove allergens from the nasal cavities as well as help with mucous clearance (this is especially helpful to do before the nasal sprays are given) - Consider allergy shots as a means of long-term control. - Allergy shots "re-train" and "reset" the immune system to ignore environmental allergens and decrease the resulting immune response to those allergens (sneezing, itchy watery eyes, runny nose, nasal congestion, etc).    - Allergy shots improve symptoms in 75-85% of patients.  - We can discuss more at the next appointment if the medications are not working for you. - The decision to start shots is entirely up to you and how bad your allergies are interrupting your life. - Feel free to play with the medications at home to see if you can find a medication regimen that works for you. 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 - Definitely do the dust mite avoidance measures to see if this can help.   2. Return in about 1 year (around 03/04/2019).  Subjective:   Donna Mcdowell is a 37 y.o. female presenting today for follow up of  Chief Complaint  Patient  presents with  . Allergy Testing  . Allergies    doing good    Donna Mcdowell has a history of the following: Patient Active Problem List   Diagnosis Date Noted  . Localized swelling, mass or lump of neck 05/26/2017  . Cigarette nicotine dependence with nicotine-induced disorder 11/02/2016  . Encounter for smoking cessation counseling 11/02/2016  . Severe cervical dysplasia     History obtained from: chart review and patient.  Donna Mcdowell's Primary Care Provider is Donna Shiley, MD.     Donna Mcdowell is a 37 y.o. female presenting for a skin testing. She was last seen on one week ago. At that time, we did blood testing for environmental allergies, which was completely negative.  We stopped her Zyrtec and started her on Allegra 180 mg 1 tablet daily, Singulair 10 mg daily, and Dymista 2 sprays per nostril 1-2 times daily as needed.  We did discuss allergy shots, which she has been on in the past.  Since the last visit, she has done well. Thankfully she has not been taking her antihistamine therefore we could fit her in for testing.   Otherwise, there have been no changes to her past medical history, surgical history, family history, or social history.    Review of Systems: a 14-point review of systems is pertinent for what is mentioned in HPI.  Otherwise, all other systems were negative. Constitutional: negative other than that listed in the HPI Eyes: negative other than that listed in the HPI Ears, nose, mouth, throat, and face: negative other than  that listed in the HPI Respiratory: negative other than that listed in the HPI Cardiovascular: negative other than that listed in the HPI Gastrointestinal: negative other than that listed in the HPI Genitourinary: negative other than that listed in the HPI Integument: negative other than that listed in the HPI Hematologic: negative other than that listed in the HPI Musculoskeletal: negative other than that listed in the  HPI Neurological: negative other than that listed in the HPI Allergy/Immunologic: negative other than that listed in the HPI    Objective:   Blood pressure 100/70, pulse 80, resp. rate 20. There is no height or weight on file to calculate BMI.   Physical Exam: deferred since this was a skin testing appointment only   Diagnostic studies:    Allergy Studies:   Indoor/Outdoor Percutaneous Adult Environmental Panel: positive to Mucor, Tricophyton, Df mite and Dp mites. Otherwise negative with adequate controls.  Indoor/Outdoor Selected Intradermal Environmental Panel: positive to [place] grass, [place] grass, weed mix, tree mix, mold mix #1, mold mix #3, mold mix #4, cat and dog. Otherwise negative with adequate controls.   Allergy testing results were read and interpreted by myself, documented by clinical staff.      Salvatore Marvel, MD  [facility]

## 2018-03-03 NOTE — Patient Instructions (Addendum)
1. Seasonal and perennial allergic rhinitis - Testing today showed: trees, weeds, grasses, indoor molds, outdoor molds, dust mites, cat and dog - Avoidance measures provided. - Continue with: Allegra (fexofenadine) 180mg  table once daily, Singulair (montelukast) 10mg  daily and Dymista (fluticasone/azelastine) two sprays per nostril 1-2 times daily as needed - You can use an extra dose of the antihistamine, if needed, for breakthrough symptoms.  - Consider nasal saline rinses 1-2 times daily to remove allergens from the nasal cavities as well as help with mucous clearance (this is especially helpful to do before the nasal sprays are given) - Consider allergy shots as a means of long-term control. - Allergy shots "re-train" and "reset" the immune system to ignore environmental allergens and decrease the resulting immune response to those allergens (sneezing, itchy watery eyes, runny nose, nasal congestion, etc).    - Allergy shots improve symptoms in 75-85% of patients.  - We can discuss more at the next appointment if the medications are not working for you. - The decision to start shots is entirely up to you and how bad your allergies are interrupting your life. - Feel free to play with the medications at home to see if you can find a medication regimen that works for you. 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 - Definitely do the dust mite avoidance measures to see if this can help.   2. Return in about 1 year (around 03/04/2019).   Please inform us of any Emergency Department visits, hospitalizations, or changes in symptoms. Call us before going to the ED for breathing or allergy symptoms since we might be able to fit you in for a sick visit. Feel free to contact us anytime with any questions, problems, or concerns.  It was a pleasure to see you again  today!  Websites that have reliable patient information: 1. American Academy of Asthma, Allergy, and Immunology: www.aaaai.org 2. Food Allergy Research and Education (FARE): foodallergy.org 3. Mothers of Asthmatics: http://www.asthmacommunitynetwork.org 4. American College of Allergy, Asthma, and Immunology: www.acaai.org   Reducing Pollen Exposure  The American Academy of Allergy, Asthma and Immunology suggests the following steps to reduce your exposure to pollen during allergy seasons.    1. Do not hang sheets or clothing out to dry; pollen may collect on these items. 2. Do not mow lawns or spend time around freshly cut grass; mowing stirs up pollen. 3. Keep windows closed at night.  Keep car windows closed while driving. 4. Minimize morning activities outdoors, a time when pollen counts are usually at their highest. 5. Stay indoors as much as possible when pollen counts or humidity is high and on windy days when pollen tends to remain in the air longer. 6. Use air conditioning when possible.  Many air conditioners have filters that trap the pollen spores. 7. Use a HEPA room air filter to remove pollen form the indoor air you breathe.  Control of Mold Allergen   Mold and fungi can grow on a variety of surfaces provided certain temperature and moisture conditions exist.  Outdoor molds grow on plants, decaying vegetation and soil.  The major outdoor mold, Alternaria and Cladosporium, are found in very high numbers during hot and dry conditions.  Generally, a late Summer - Fall peak is seen for common outdoor fungal spores.  Rain will temporarily lower outdoor mold spore  count, but counts rise rapidly when the rainy period ends.  The most important indoor molds are Aspergillus and Penicillium.  Dark, humid and poorly ventilated basements are ideal sites for mold growth.  The next most common sites of mold growth are the bathroom and the kitchen.  Outdoor (Seasonal) Mold Control  Positive  outdoor molds via skin testing: Alternaria, Cladosporium, Bipolaris (Helminthsporium), Drechslera (Curvalaria) and Mucor  1. Use air conditioning and keep windows closed 2. Avoid exposure to decaying vegetation. 3. Avoid leaf raking. 4. Avoid grain handling. 5. Consider wearing a face mask if working in moldy areas.  6.   Indoor (Perennial) Mold Control   Positive indoor molds via skin testing: Fusarium, Aureobasidium (Pullulara) and Rhizopus  1. Maintain humidity below 50%. 2. Clean washable surfaces with 5% bleach solution. 3. Remove sources e.g. contaminated carpets.     Control of Dog or Cat Allergen  Avoidance is the best way to manage a dog or cat allergy. If you have a dog or cat and are allergic to dog or cats, consider removing the dog or cat from the home. If you have a dog or cat but don't want to find it a new home, or if your family wants a pet even though someone in the household is allergic, here are some strategies that may help keep symptoms at bay:  1. Keep the pet out of your bedroom and restrict it to only a few rooms. Be advised that keeping the dog or cat in only one room will not limit the allergens to that room. 2. Don't pet, hug or kiss the dog or cat; if you do, wash your hands with soap and water. 3. High-efficiency particulate air (HEPA) cleaners run continuously in a bedroom or living room can reduce allergen levels over time. 4. Regular use of a high-efficiency vacuum cleaner or a central vacuum can reduce allergen levels. 5. Giving your dog or cat a bath at least once a week can reduce airborne allergen.  Control of House Dust Mite Allergen    House dust mites play a major role in allergic asthma and rhinitis.  They occur in environments with high humidity wherever human skin, the food for dust mites is found. High levels have been detected in dust obtained from mattresses, pillows, carpets, upholstered furniture, bed covers, clothes and soft toys.   The principal allergen of the house dust mite is found in its feces.  A gram of dust may contain 1,000 mites and 250,000 fecal particles.  Mite antigen is easily measured in the air during house cleaning activities.    1. Encase mattresses, including the box spring, and pillow, in an air tight cover.  Seal the zipper end of the encased mattresses with wide adhesive tape. 2. Wash the bedding in water of 130 degrees Farenheit weekly.  Avoid cotton comforters/quilts and flannel bedding: the most ideal bed covering is the dacron comforter. 3. Remove all upholstered furniture from the bedroom. 4. Remove carpets, carpet padding, rugs, and non-washable window drapes from the bedroom.  Wash drapes weekly or use plastic window coverings. 5. Remove all non-washable stuffed toys from the bedroom.  Wash stuffed toys weekly. 6. Have the room cleaned frequently with a vacuum cleaner and a damp dust-mop.  The patient should not be in a room which is being cleaned and should wait 1 hour after cleaning before going into the room. 7. Close and seal all heating outlets in the bedroom.  Otherwise, the room will become filled  with dust-laden air.  An electric heater can be used to heat the room. 8. Reduce indoor humidity to less than 50%.  Do not use a humidifier.    Allergy Shots   Allergies are the result of a chain reaction that starts in the immune system. Your immune system controls how your body defends itself. For instance, if you have an allergy to pollen, your immune system identifies pollen as an invader or allergen. Your immune system overreacts by producing antibodies called Immunoglobulin E (IgE). These antibodies travel to cells that release chemicals, causing an allergic reaction.  The concept behind allergy immunotherapy, whether it is received in the form of shots or tablets, is that the immune system can be desensitized to specific allergens that trigger allergy symptoms. Although it requires time and  patience, the payback can be long-term relief.  How Do Allergy Shots Work?  Allergy shots work much like a vaccine. Your body responds to injected amounts of a particular allergen given in increasing doses, eventually developing a resistance and tolerance to it. Allergy shots can lead to decreased, minimal or no allergy symptoms.  There generally are two phases: build-up and maintenance. Build-up often ranges from three to six months and involves receiving injections with increasing amounts of the allergens. The shots are typically given once or twice a week, though more rapid build-up schedules are sometimes used.  The maintenance phase begins when the most effective dose is reached. This dose is different for each person, depending on how allergic you are and your response to the build-up injections. Once the maintenance dose is reached, there are longer periods between injections, typically two to four weeks.  Occasionally doctors give cortisone-type shots that can temporarily reduce allergy symptoms. These types of shots are different and should not be confused with allergy immunotherapy shots.  Who Can Be Treated with Allergy Shots?  Allergy shots may be a good treatment approach for people with allergic rhinitis (hay fever), allergic asthma, conjunctivitis (eye allergy) or stinging insect allergy.   Before deciding to begin allergy shots, you should consider:  . The length of allergy season and the severity of your symptoms . Whether medications and/or changes to your environment can control your symptoms . Your desire to avoid long-term medication use . Time: allergy immunotherapy requires a major time commitment . Cost: may vary depending on your insurance coverage  Allergy shots for children age 30 and older are effective and often well tolerated. They might prevent the onset of new allergen sensitivities or the progression to asthma.  Allergy shots are not started on patients  who are pregnant but can be continued on patients who become pregnant while receiving them. In some patients with other medical conditions or who take certain common medications, allergy shots may be of risk. It is important to mention other medications you talk to your allergist.   When Will I Feel Better?  Some may experience decreased allergy symptoms during the build-up phase. For others, it may take as long as 12 months on the maintenance dose. If there is no improvement after a year of maintenance, your allergist will discuss other treatment options with you.  If you aren't responding to allergy shots, it may be because there is not enough dose of the allergen in your vaccine or there are missing allergens that were not identified during your allergy testing. Other reasons could be that there are high levels of the allergen in your environment or major exposure to non-allergic triggers  like tobacco smoke.  What Is the Length of Treatment?  Once the maintenance dose is reached, allergy shots are generally continued for three to five years. The decision to stop should be discussed with your allergist at that time. Some people may experience a permanent reduction of allergy symptoms. Others may relapse and a longer course of allergy shots can be considered.  What Are the Possible Reactions?  The two types of adverse reactions that can occur with allergy shots are local and systemic. Common local reactions include very mild redness and swelling at the injection site, which can happen immediately or several hours after. A systemic reaction, which is less common, affects the entire body or a particular body system. They are usually mild and typically respond quickly to medications. Signs include increased allergy symptoms such as sneezing, a stuffy nose or hives.  Rarely, a serious systemic reaction called anaphylaxis can develop. Symptoms include swelling in the throat, wheezing, a feeling of  tightness in the chest, nausea or dizziness. Most serious systemic reactions develop within 30 minutes of allergy shots. This is why it is strongly recommended you wait in your doctor's office for 30 minutes after your injections. Your allergist is trained to watch for reactions, and his or her staff is trained and equipped with the proper medications to identify and treat them.  Who Should Administer Allergy Shots?  The preferred location for receiving shots is your prescribing allergist's office. Injections can sometimes be given at another facility where the physician and staff are trained to recognize and treat reactions, and have received instructions by your prescribing allergist.

## 2018-03-08 ENCOUNTER — Encounter: Payer: Self-pay | Admitting: Allergy & Immunology

## 2018-03-15 ENCOUNTER — Encounter (HOSPITAL_COMMUNITY): Payer: Self-pay | Admitting: Emergency Medicine

## 2018-03-15 ENCOUNTER — Other Ambulatory Visit: Payer: Self-pay

## 2018-03-15 ENCOUNTER — Emergency Department (HOSPITAL_COMMUNITY)
Admission: EM | Admit: 2018-03-15 | Discharge: 2018-03-15 | Disposition: A | Discharge status: Home / Self Care | Payer: 59 | Attending: Emergency Medicine | Admitting: Emergency Medicine

## 2018-03-15 DIAGNOSIS — Z79899 Other long term (current) drug therapy: Secondary | ICD-10-CM | POA: Diagnosis not present

## 2018-03-15 DIAGNOSIS — F17210 Nicotine dependence, cigarettes, uncomplicated: Secondary | ICD-10-CM | POA: Insufficient documentation

## 2018-03-15 DIAGNOSIS — R42 Dizziness and giddiness: Secondary | ICD-10-CM | POA: Diagnosis not present

## 2018-03-15 LAB — CBC
HCT: 39.5 % (ref 36.0–46.0)
HEMOGLOBIN: 13.4 g/dL (ref 12.0–15.0)
MCH: 32.2 pg (ref 26.0–34.0)
MCHC: 33.9 g/dL (ref 30.0–36.0)
MCV: 95 fL (ref 78.0–100.0)
PLATELETS: 175 10*3/uL (ref 150–400)
RBC: 4.16 MIL/uL (ref 3.87–5.11)
RDW: 12.9 % (ref 11.5–15.5)
WBC: 7.7 10*3/uL (ref 4.0–10.5)

## 2018-03-15 LAB — BASIC METABOLIC PANEL
ANION GAP: 8 (ref 5–15)
BUN: 10 mg/dL (ref 6–20)
CALCIUM: 9.4 mg/dL (ref 8.9–10.3)
CO2: 27 mmol/L (ref 22–32)
Chloride: 107 mmol/L (ref 101–111)
Creatinine, Ser: 0.6 mg/dL (ref 0.44–1.00)
GLUCOSE: 101 mg/dL — AB (ref 65–99)
Potassium: 4 mmol/L (ref 3.5–5.1)
Sodium: 142 mmol/L (ref 135–145)

## 2018-03-15 LAB — URINALYSIS, ROUTINE W REFLEX MICROSCOPIC
BILIRUBIN URINE: NEGATIVE
Glucose, UA: NEGATIVE mg/dL
Hgb urine dipstick: NEGATIVE
Ketones, ur: NEGATIVE mg/dL
Leukocytes, UA: NEGATIVE
Nitrite: NEGATIVE
Protein, ur: NEGATIVE mg/dL
SPECIFIC GRAVITY, URINE: 1.004 — AB (ref 1.005–1.030)
pH: 7 (ref 5.0–8.0)

## 2018-03-15 LAB — CBG MONITORING, ED: Glucose-Capillary: 92 mg/dL (ref 65–99)

## 2018-03-15 LAB — I-STAT BETA HCG BLOOD, ED (MC, WL, AP ONLY)

## 2018-03-15 MED ORDER — PREDNISONE 20 MG PO TABS: 40.0000 mg | ORAL_TABLET | Freq: Every day | ORAL | 0 refills | Status: DC

## 2018-03-15 MED ORDER — MECLIZINE HCL 25 MG PO TABS: 25.0000 mg | ORAL_TABLET | Freq: Three times a day (TID) | ORAL | 0 refills | Status: DC | PRN

## 2018-03-15 NOTE — ED Provider Notes (Addendum)
[facility] DEPT Provider Note   CSN: [id number] Arrival date & time: 03/15/18  1212     History   Chief Complaint Chief Complaint  Patient presents with  . Dizziness    HPI Donna Mcdowell is a 37 y.o. female.  HPI Donna Mcdowell is a 37 y.o. female presented to ED with dizziness. Has had several episodes, first a week and a half ago, episodes last night and one today. Describes sensation of spinning. Worse with position change.  She states that she recently has had allergies and nasal congestion and has been to an allergist.  She takes multiple allergy medications which are not helping.  She continues to have nasal congestion and fullness in bilateral ears.  She actually has an appointment with ear nose throat in 1 week for the same.  She denies history of similar symptoms in the past.  She has no other focal neurological complaint.  She did not try any medications other than her allergy pills prior to coming in.  She states she is supposed to be taking nasal spray but she has not started taking it yet. No CP. NO SOB. No fever, chills, malaise.   Past Medical History:  Diagnosis Date  . Anxiety   . Breast pain in female    breast abscess  . Fatigue   . Ovarian cyst   . Panic attack   . 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 Wears glasses    wears contacts     Patient Active Problem List   Diagnosis Date Noted  . Localized swelling, mass or lump of neck 05/26/2017  . Cigarette nicotine dependence with nicotine-induced disorder 11/02/2016  . Encounter for smoking cessation counseling 11/02/2016  . Severe cervical dysplasia     Past Surgical History:  Procedure Laterality Date  . BREAST SURGERY Left    breast abscess, history of peircing on that breast  . CERVICAL BIOPSY  W/ LOOP ELECTRODE EXCISION  02/2013   CIN II-III  . HAND SURGERY    . MOLE REMOVAL     moles removed from left leg and left upper abdomen      OB  History    Gravida  0   Para      Term      Preterm      AB      Living        SAB      TAB      Ectopic      Multiple      Live Births               Home Medications    Prior to Admission medications   Medication Sig Start Date End Date Taking? Authorizing Provider  Aspirin-Acetaminophen-Caffeine (GOODY HEADACHE PO) Take 1 packet by mouth daily as needed (headache).   Yes [provider]  fexofenadine (ALLEGRA) 180 MG tablet Take 1 tablet (180 mg total) by mouth daily. 02/23/18  Yes [name], MD  ibuprofen (ADVIL,MOTRIN) 200 MG tablet Take 200 mg by mouth every 6 (six) hours as needed.   Yes [provider]  montelukast (SINGULAIR) 10 MG tablet Take 1 tablet (10 mg total) by mouth at bedtime. 02/23/18  Yes [name], MD  Azelastine-Fluticasone [facility]) 137-50 MCG/ACT SUSP Place 2 sprays into both nostrils 2 (two) times daily as needed. 02/23/18   [name], MD  CHANTIX CONTINUING MONTH PAK 1 MG tablet  TAKE 1 TABLET(1 MG) BY MOUTH TWICE DAILY Patient not taking: Reported on 03/15/2018 10/14/17   McVey, Gelene Mink, PA-C  diclofenac (VOLTAREN) 75 MG EC tablet Take 1 tablet (75 mg total) by mouth 2 (two) times daily. Patient not taking: Reported on 03/15/2018 0/93/26   Chitanand, Alicia B, DO  fluticasone (FLONASE) 50 MCG/ACT nasal spray Place 2 sprays into both nostrils daily. Patient not taking: Reported on 03/15/2018 11/02/16   McVey, Gelene Mink, PA-C    Family History Family History  Problem Relation Age of Onset  . Breast cancer Mother 33  . Cancer Mother        Breast  . Osteoarthritis Maternal Grandmother   . Cancer Maternal Grandmother        Lung  . Osteoarthritis Maternal Grandfather   . Heart failure Maternal Grandfather   . Allergic rhinitis Neg Hx   . Angioedema Neg Hx   . Asthma Neg Hx   . Atopy Neg Hx   . Eczema Neg Hx   . Immunodeficiency Neg Hx   . Urticaria Neg Hx     Social  History Social History   Tobacco Use  . Smoking status: Current Every Day Smoker    Packs/day: 1.00    Types: Cigarettes  . Smokeless tobacco: Never Used  Substance Use Topics  . Alcohol use: Yes    Alcohol/week: 8.4 - 12.0 oz    Types: 14 - 20 Cans of beer per week  . Drug use: No     Allergies   Patient has no active allergies.   Review of Systems Review of Systems  Constitutional: Negative for chills and fever.  HENT: Positive for congestion, rhinorrhea and sinus pressure. Negative for ear discharge, ear pain, sinus pain and tinnitus.   Respiratory: Negative for cough, chest tightness and shortness of breath.   Cardiovascular: Negative for chest pain, palpitations and leg swelling.  Gastrointestinal: Negative for abdominal pain, diarrhea, nausea and vomiting.  Genitourinary: Negative for dysuria, flank pain, pelvic pain, vaginal bleeding, vaginal discharge and vaginal pain.  Musculoskeletal: Negative for arthralgias, myalgias, neck pain and neck stiffness.  Skin: Negative for rash.  Neurological: Positive for dizziness. Negative for weakness and headaches.  All other systems reviewed and are negative.    Physical Exam Updated Vital Signs BP 118/86 (BP Location: Left Arm)   Pulse 72   Temp 97.9 F (36.6 C) (Oral)   Resp 20   Ht 5\' 4"  (1.626 m)   Wt 61.2 kg (135 lb)   SpO2 100%   BMI 23.17 kg/m   Physical Exam  Constitutional: She is oriented to person, place, and time. She appears well-developed and well-nourished. No distress.  HENT:  Head: Normocephalic.  Right Ear: External ear normal.  Left Ear: External ear normal.  Mouth/Throat: Oropharynx is clear and moist.  Clear rhinorrhea present.  External ears, ear canals, TMs are normal bilaterally  Eyes: Conjunctivae are normal.  Neck: Neck supple.  Cardiovascular: Normal rate, regular rhythm and normal heart sounds.  Pulmonary/Chest: Effort normal and breath sounds normal. No respiratory distress. She has  no wheezes. She has no rales.  Abdominal: Soft. Bowel sounds are normal. She exhibits no distension. There is no tenderness. There is no rebound.  Musculoskeletal: She exhibits no edema.  Neurological: She is alert and oriented to person, place, and time.  Skin: Skin is warm and dry.  Psychiatric: She has a normal mood and affect. Her behavior is normal.  Nursing note and vitals reviewed.  ED Treatments / Results  Labs (all labs ordered are listed, but only abnormal results are displayed) Labs Reviewed  BASIC METABOLIC PANEL - Abnormal; Notable for the following components:      Result Value   Glucose, Bld 101 (*)    All other components within normal limits  URINALYSIS, ROUTINE W REFLEX MICROSCOPIC - Abnormal; Notable for the following components:   Color, Urine STRAW (*)    Specific Gravity, Urine 1.004 (*)    All other components within normal limits  CBC  CBG MONITORING, ED  I-STAT BETA HCG BLOOD, ED ([facility], [facility], [facility] ONLY)    EKG None  Radiology No results found.  Procedures Procedures (including critical care time)  Medications Ordered in ED Medications - No data to display   Initial Impression / Assessment and Plan / ED Course  I have reviewed the triage vital signs and the nursing notes.  Pertinent labs & imaging results that were available during my care of the patient were reviewed by me and considered in my medical decision making (see chart for details).     Patient in emergency department with vertigo-like symptoms.  She is not orthostatic, she is nontoxic, no focal findings on exam right now.  She is actually asymptomatic at this time.  Vital signs all within normal.  She has been dealing with some postnasal drainage and congestion, and has seen an allergist.  I believe her symptoms are most likely caused by this congestion.  We discussed continuing her allergy medications, adding steroid nasal spray, oral steroids burst for 5 days, meclizine for dizziness,  and Epley's maneuver.  Follow-up with family doctor and ear nose throat.  Vitals:   03/15/18 1222 03/15/18 1338  BP: 119/82 118/86  Pulse: 82 72  Resp: 14 20  Temp: 97.9 F (36.6 C)   TempSrc: Oral   SpO2: 100% 100%  Weight: 61.2 kg (135 lb)   Height: 5\' 4"  (1.626 m)     Final Clinical Impressions(s) / ED Diagnoses   Final diagnoses:  Vertigo    ED Discharge Orders        Ordered    meclizine (ANTIVERT) 25 MG tablet  3 times daily PRN     03/15/18 1418    predniSONE (DELTASONE) 20 MG tablet  Daily     03/15/18 1418       Jeannett Senior, PA-C 03/15/18 1419    Jeannett Senior, PA-C 03/15/18 1419    Tegeler, Gwenyth Allegra, MD 03/15/18 [phone]

## 2018-03-15 NOTE — ED Triage Notes (Signed)
Pt complaint of intermittent dizziness post blowing nose last night.

## 2018-03-15 NOTE — Discharge Instructions (Addendum)
Continue all your allergy medications and start a nasal spray.  Take prednisone as prescribed until all gone.  Take meclizine for dizziness as needed only.  Try Epley's maneuver at home.  Follow-up with your nose throat as scheduled. Return if worsening

## 2018-03-23 ENCOUNTER — Ambulatory Visit: Payer: PRIVATE HEALTH INSURANCE | Admitting: Allergy & Immunology

## 2018-05-10 ENCOUNTER — Other Ambulatory Visit (INDEPENDENT_AMBULATORY_CARE_PROVIDER_SITE_OTHER): Payer: Self-pay | Admitting: Otolaryngology

## 2018-06-05 ENCOUNTER — Emergency Department (HOSPITAL_COMMUNITY)
Admission: EM | Admit: 2018-06-05 | Discharge: 2018-06-05 | Disposition: A | Discharge status: Home / Self Care | Payer: 59 | Attending: Emergency Medicine | Admitting: Emergency Medicine

## 2018-06-05 ENCOUNTER — Other Ambulatory Visit: Payer: Self-pay

## 2018-06-05 ENCOUNTER — Emergency Department (HOSPITAL_COMMUNITY): Payer: 59

## 2018-06-05 ENCOUNTER — Encounter (HOSPITAL_COMMUNITY): Payer: Self-pay

## 2018-06-05 DIAGNOSIS — Z79899 Other long term (current) drug therapy: Secondary | ICD-10-CM | POA: Diagnosis not present

## 2018-06-05 DIAGNOSIS — R42 Dizziness and giddiness: Secondary | ICD-10-CM | POA: Insufficient documentation

## 2018-06-05 DIAGNOSIS — F17210 Nicotine dependence, cigarettes, uncomplicated: Secondary | ICD-10-CM | POA: Insufficient documentation

## 2018-06-05 DIAGNOSIS — R51 Headache: Secondary | ICD-10-CM | POA: Insufficient documentation

## 2018-06-05 DIAGNOSIS — F419 Anxiety disorder, unspecified: Secondary | ICD-10-CM | POA: Insufficient documentation

## 2018-06-05 MED ORDER — MECLIZINE HCL 12.5 MG PO TABS: 12.5000 mg | ORAL_TABLET | Freq: Three times a day (TID) | ORAL | 0 refills | Status: DC | PRN

## 2018-06-05 MED ORDER — DIAZEPAM 5 MG PO TABS
5.0000 mg | ORAL_TABLET | Freq: Once | ORAL | Status: AC
  Administered 2018-06-05: 5 mg via ORAL
  Filled 2018-06-05: qty 1

## 2018-06-05 NOTE — ED Triage Notes (Signed)
Pt reports profound dizziness x4 days. She states that she was treated 5 days ago for vertigo, but the dizziness continues and is causing heart palpitations, headache, and panic attacks. A&Ox4. Ambulatory. No facial droop or unilateral weakness noted.

## 2018-06-05 NOTE — ED Notes (Signed)
Patient transported to MRI 

## 2018-06-05 NOTE — ED Notes (Signed)
Patient gave urine sample, in mini lab.

## 2018-06-14 NOTE — ED Provider Notes (Signed)
[name] Provider Note   CSN: [id number] Arrival date & time: 06/05/18  1656     History   Chief Complaint Chief Complaint  Patient presents with  . Dizziness  . Anxiety    HPI Donna Mcdowell is a 37 y.o. female.  HPI   37 year old female with dizziness.  Intermittent for over a week.  She describes sensation of room spinning.  Associated with palpitations she states that she feels anxious when she feels like this.  Does seem positional and exacerbated with certain movements.  No tenderness.  Mild dull ache in the occipital region which she describes as a pulling sensation.  No acute numbness, tingling or focal loss of strength.  She has been evaluated for the same complaints but is concerned that symptoms are still continuing.  Past Medical History:  Diagnosis Date  . Anxiety   . Breast pain in female    breast abscess  . Fatigue   . Ovarian cyst   . Panic attack   . 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 Wears glasses    wears contacts     Patient Active Problem List   Diagnosis Date Noted  . Localized swelling, mass or lump of neck 05/26/2017  . Cigarette nicotine dependence with nicotine-induced disorder 11/02/2016  . Encounter for smoking cessation counseling 11/02/2016  . Severe cervical dysplasia     Past Surgical History:  Procedure Laterality Date  . BREAST SURGERY Left    breast abscess, history of peircing on that breast  . CERVICAL BIOPSY  W/ LOOP ELECTRODE EXCISION  02/2013   CIN II-III  . HAND SURGERY    . MOLE REMOVAL     moles removed from left leg and left upper abdomen      OB History    Gravida  0   Para      Term      Preterm      AB      Living        SAB      TAB      Ectopic      Multiple      Live Births               Home Medications    Prior to Admission medications   Medication Sig Start Date End Date Taking? Authorizing Provider    Aspirin-Acetaminophen-Caffeine (GOODY HEADACHE PO) Take 1 packet by mouth daily as needed (headache).   Yes [provider]  fluticasone (FLONASE) 50 MCG/ACT nasal spray Place 2 sprays into both nostrils daily. 11/02/16  Yes McVey, Gelene Mink, PA-C  ibuprofen (ADVIL,MOTRIN) 200 MG tablet Take 600 mg by mouth 2 (two) times daily as needed for headache.   Yes [provider]  meclizine (ANTIVERT) 25 MG tablet Take 1 tablet (25 mg total) by mouth 3 (three) times daily as needed for dizziness. 03/15/18  Yes Kirichenko, Tatyana, PA-C  polyvinyl alcohol (LIQUIFILM TEARS) 1.4 % ophthalmic solution Place 1 drop into both eyes daily as needed for dry eyes.   Yes [provider]  predniSONE (DELTASONE) 20 MG tablet Take 2 tablets (40 mg total) by mouth daily. Patient taking differently: Take 10 mg by mouth 4 (four) times daily.  03/15/18  Yes Kirichenko, Tatyana, PA-C  Azelastine-Fluticasone (DYMISTA) 137-50 MCG/ACT SUSP Place 2 sprays into both nostrils 2 (two) times daily as needed. Patient not taking: Reported on 06/05/2018 02/23/18   [name],  MD  CHANTIX CONTINUING MONTH PAK 1 MG tablet TAKE 1 TABLET(1 MG) BY MOUTH TWICE DAILY Patient not taking: Reported on 03/15/2018 10/14/17   McVey, Gelene Mink, PA-C  diclofenac (VOLTAREN) 75 MG EC tablet Take 1 tablet (75 mg total) by mouth 2 (two) times daily. Patient not taking: Reported on 03/15/2018 0/45/40   Chitanand, Elmo Putt B, DO  fexofenadine (ALLEGRA) 180 MG tablet Take 1 tablet (180 mg total) by mouth daily. Patient not taking: Reported on 06/05/2018 02/23/18   [name], MD  meclizine (ANTIVERT) 12.5 MG tablet Take 1 tablet (12.5 mg total) by mouth 3 (three) times daily as needed for dizziness. 06/05/18   Virgel Manifold, MD  montelukast (SINGULAIR) 10 MG tablet Take 1 tablet (10 mg total) by mouth at bedtime. Patient not taking: Reported on 06/05/2018 02/23/18   [name], MD    Family  History Family History  Problem Relation Age of Onset  . Breast cancer Mother 76  . Cancer Mother        Breast  . Osteoarthritis Maternal Grandmother   . Cancer Maternal Grandmother        Lung  . Osteoarthritis Maternal Grandfather   . Heart failure Maternal Grandfather   . Allergic rhinitis Neg Hx   . Angioedema Neg Hx   . Asthma Neg Hx   . Atopy Neg Hx   . Eczema Neg Hx   . Immunodeficiency Neg Hx   . Urticaria Neg Hx     Social History Social History   Tobacco Use  . Smoking status: Current Every Day Smoker    Packs/day: 1.00    Types: Cigarettes  . Smokeless tobacco: Never Used  Substance Use Topics  . Alcohol use: Yes    Alcohol/week: 14.0 - 20.0 standard drinks    Types: 14 - 20 Cans of beer per week  . Drug use: No     Allergies   Patient has no known allergies.   Review of Systems Review of Systems  All systems reviewed and negative, other than as noted in HPI.  Physical Exam Updated Vital Signs BP 103/67   Pulse 88   Temp 98.3 F (36.8 C) (Oral)   Resp 20   SpO2 100%   Physical Exam  Constitutional: She is oriented to person, place, and time. She appears well-developed and well-nourished. No distress.  HENT:  Head: Normocephalic and atraumatic.  Eyes: Pupils are equal, round, and reactive to light. Conjunctivae and EOM are normal. Right eye exhibits no discharge. Left eye exhibits no discharge.  Neck: Neck supple.  No nuchal rigidity  Cardiovascular: Normal rate, regular rhythm and normal heart sounds. Exam reveals no gallop and no friction rub.  No murmur heard. Pulmonary/Chest: Effort normal and breath sounds normal. No respiratory distress.  Abdominal: Soft. She exhibits no distension. There is no tenderness.  Musculoskeletal: She exhibits no edema or tenderness.  Neurological: She is alert and oriented to person, place, and time. No cranial nerve deficit. She exhibits normal muscle tone. Coordination normal.  Good finger-nose testing  bilaterally.  Steady appearing gait.  Skin: Skin is warm and dry.  Psychiatric: She has a normal mood and affect. Her behavior is normal. Thought content normal.  Nursing note and vitals reviewed.    ED Treatments / Results  Labs (all labs ordered are listed, but only abnormal results are displayed) Labs Reviewed - No data to display  EKG EKG Interpretation  Date/Time:  Sunday June 05 2018 17:49:22 EDT Ventricular Rate:  89 PR Interval:    QRS Duration: 89 QT Interval:  352 QTC Calculation: 429 R Axis:   42 Text Interpretation:  Sinus rhythm No significant change since last tracing Confirmed by Virgel Manifold [phone]) on 06/05/2018 6:25:28 PM   Radiology No results found.   Mr Brain Wo Contrast  Result Date: 06/05/2018 CLINICAL DATA:  Vertigo, persistent, central. Profound dizziness for 4 days. EXAM: MRI HEAD WITHOUT CONTRAST TECHNIQUE: Multiplanar, multiecho pulse sequences of the brain and surrounding structures were obtained without intravenous contrast. COMPARISON:  CT head without contrast 05/31/2017 FINDINGS: Brain: The diffusion-weighted images demonstrate no acute or subacute infarction. No acute abnormalities are present in the posterior fossa. Cerebellar tonsils extend to the foramen magnum without a Chiari malformation. The fourth ventricle is of normal size No acute hemorrhage or mass lesion is present. The ventricles are of normal size. No significant extra-axial fluid collection is present Vascular: Flow is present in the major intracranial arteries. Skull and upper cervical spine: The skull base is within normal limits. The craniocervical junction is normal. The upper cervical spine is within normal limits. Sinuses/Orbits: The paranasal sinuses and mastoid air cells are clear. Globes and orbits are within normal limits. Inner ear structures are unremarkable. IMPRESSION: Normal MRI the brain. No acute or focal lesion to explain the patient's symptoms. Electronically Signed    By: San Morelle M.D.   On: 06/05/2018 19:57    Procedures Procedures (including critical care time)  Medications Ordered in ED Medications  diazepam (VALIUM) tablet 5 mg (5 mg Oral Given 06/05/18 1810)     Initial Impression / Assessment and Plan / ED Course  I have reviewed the triage vital signs and the nursing notes.  Pertinent labs & imaging results that were available during my care of the patient were reviewed by me and considered in my medical decision making (see chart for details).    I have reviewed the triage vital signs and the nursing notes. Prior records were reviewed for additional information.    Pertinent labs & imaging results that were available during my care of the patient were reviewed by me and considered in my medical decision making (see chart for details).  37 year old female with what to be sounds like vertigo.  Likely peripheral.  Neuro exam is nonfocal.  MRI without acute explanatory pathology.  I doubt emergent process.  May potentially be component of anxiety.  Try to reassure her.  PRN meclizine.  Directions for Epley maneuvers.  Emergent return precautions were discussed.  Outpatient follow-up otherwise.  Final Clinical Impressions(s) / ED Diagnoses   Final diagnoses:  Vertigo    ED Discharge Orders         Ordered    meclizine (ANTIVERT) 12.5 MG tablet  3 times daily PRN     06/05/18 Venancio Poisson, MD 06/14/18 [id number]

## 2018-07-01 ENCOUNTER — Other Ambulatory Visit: Payer: Self-pay | Admitting: Otolaryngology

## 2018-07-12 DIAGNOSIS — J342 Deviated nasal septum: Secondary | ICD-10-CM

## 2018-07-12 DIAGNOSIS — J3489 Other specified disorders of nose and nasal sinuses: Secondary | ICD-10-CM

## 2018-07-12 DIAGNOSIS — J343 Hypertrophy of nasal turbinates: Secondary | ICD-10-CM

## 2018-07-12 HISTORY — DX: Deviated nasal septum: J34.2

## 2018-07-12 HISTORY — DX: Other specified disorders of nose and nasal sinuses: J34.89

## 2018-07-12 HISTORY — DX: Hypertrophy of nasal turbinates: J34.3

## 2018-07-19 ENCOUNTER — Other Ambulatory Visit: Payer: Self-pay

## 2018-07-19 ENCOUNTER — Encounter (HOSPITAL_BASED_OUTPATIENT_CLINIC_OR_DEPARTMENT_OTHER): Payer: Self-pay | Admitting: *Deleted

## 2018-07-21 ENCOUNTER — Encounter (HOSPITAL_COMMUNITY): Payer: Self-pay | Admitting: Family Medicine

## 2018-07-21 ENCOUNTER — Ambulatory Visit (HOSPITAL_COMMUNITY)
Admission: EM | Admit: 2018-07-21 | Discharge: 2018-07-21 | Disposition: A | Discharge status: Home / Self Care | Payer: 59 | Attending: Family Medicine | Admitting: Family Medicine

## 2018-07-21 DIAGNOSIS — R51 Headache: Secondary | ICD-10-CM

## 2018-07-21 DIAGNOSIS — H9392 Unspecified disorder of left ear: Secondary | ICD-10-CM | POA: Diagnosis not present

## 2018-07-21 DIAGNOSIS — L0201 Cutaneous abscess of face: Secondary | ICD-10-CM

## 2018-07-21 MED ORDER — LIDOCAINE-EPINEPHRINE (PF) 2 %-1:200000 IJ SOLN
INTRAMUSCULAR | Status: AC
  Filled 2018-07-21: qty 20

## 2018-07-21 NOTE — ED Triage Notes (Signed)
PT reports headache behind left ear. Left ear then got "warm" and feels full.

## 2018-07-21 NOTE — Discharge Instructions (Addendum)
5 more days of doxycycline twice a day  Wash the abscess area gently with warm soapy water twice a day.  Try to massage any further debris out of it.

## 2018-07-21 NOTE — ED Provider Notes (Signed)
[facility]    CSN: [id number] Arrival date & time: 07/21/18  1629     History   Chief Complaint Chief Complaint  Patient presents with  . Otalgia    HPI Donna Mcdowell is a 37 y.o. female.   Is a 37 year old woman who complains of left forehead discomfort.  She has had swelling for many years in the left frontal face but it swelled up and became red at about 2 weeks ago.  She went to a dermatologist who said that opening the area would cause "puckering".  So instead of incision and drainage, he elected to prescribe doxycycline with no follow-up visit.  Patient had to go to a wedding a few days later.  She has had to cover the area because it stayed swollen and tender.    recently her left ear became slightly reddened and warm.     Past Medical History:  Diagnosis Date  . Carbuncle of face    forehead  . Deviated septum 07/2018  . Nasal mass 07/2018  . Nasal turbinate hypertrophy 07/2018   bilateral    Patient Active Problem List   Diagnosis Date Noted  . Localized swelling, mass or lump of neck 05/26/2017  . Cigarette nicotine dependence with nicotine-induced disorder 11/02/2016  . Encounter for smoking cessation counseling 11/02/2016  . Severe cervical dysplasia     Past Surgical History:  Procedure Laterality Date  . FINGER TENDON REPAIR Right 06/23/2010   long finger  . INCISION AND DRAINAGE BREAST ABSCESS Left 06/09/2011  . TRIGGER FINGER RELEASE Right 06/23/2010   long finger    OB History    Gravida  0   Para      Term      Preterm      AB      Living        SAB      TAB      Ectopic      Multiple      Live Births               Home Medications    Prior to Admission medications   Medication Sig Start Date End Date Taking? Authorizing Provider  doxycycline (ADOXA) 100 MG tablet Take 100 mg by mouth 2 (two) times daily. 07/12/18  Yes [provider]  fexofenadine (ALLEGRA) 180 MG tablet Take 180 mg  by mouth daily.   Yes [provider]  fluticasone (FLONASE) 50 MCG/ACT nasal spray Place 1 spray into both nostrils daily.   Yes [provider]  ibuprofen (ADVIL,MOTRIN) 200 MG tablet Take 600 mg by mouth 2 (two) times daily as needed for headache.   Yes [provider]  mupirocin cream (BACTROBAN) 2 % Apply 1 application topically 3 (three) times daily.    [provider]    Family History Family History  Problem Relation Age of Onset  . Breast cancer Mother 58  . Cancer Mother        Breast  . Osteoarthritis Maternal Grandmother   . Cancer Maternal Grandmother        Lung  . Osteoarthritis Maternal Grandfather   . Heart failure Maternal Grandfather     Social History Social History   Tobacco Use  . Smoking status: Current Every Day Smoker    Packs/day: 1.00    Years: 19.00    Pack years: 19.00    Types: Cigarettes  . Smokeless tobacco: Never Used  Substance Use Topics  .  Alcohol use: Yes    Comment: 4 x/week  . Drug use: No     Allergies   Oxycodone   Review of Systems Review of Systems   Physical Exam Triage Vital Signs ED Triage Vitals  Enc Vitals Group     BP      Pulse      Resp      Temp      Temp src      SpO2      Weight      Height      Head Circumference      Peak Flow      Pain Score      Pain Loc      Pain Edu?      Excl. in [place]?    No data found.  Updated Vital Signs BP (!) 114/93   Pulse 86   Temp 98.4 F (36.9 C) (Oral)   Resp 16   LMP 07/14/2018   SpO2 100%   Physical Exam  Constitutional: She is oriented to person, place, and time. She appears well-developed and well-nourished.  HENT:  Mouth/Throat: Oropharynx is clear and moist.  Mild erythema without warmth or tenderness both pinna  Eyes: Conjunctivae are normal.  Neck: Normal range of motion. Neck supple.  Pulmonary/Chest: Effort normal.  Musculoskeletal: Normal range of motion.  Lymphadenopathy:    She has no cervical  adenopathy.  Neurological: She is alert and oriented to person, place, and time.  Nursing note and vitals reviewed.      UC Treatments / Results  Labs (all labs ordered are listed, but only abnormal results are displayed) Labs Reviewed - No data to display  EKG None  Radiology No results found.  Procedures Incision and Drainage Date/Time: 07/21/2018 5:37 PM Performed by: Robyn Haber, MD Authorized by: Robyn Haber, MD   Consent:    Consent obtained:  Verbal   Consent given by:  Patient   Risks discussed:  Incomplete drainage and infection Location:    Type:  Abscess   Size:  1 cm   Location:  Head   Head location:  Face Pre-procedure details:    Skin preparation:  Betadine Anesthesia (see MAR for exact dosages):    Anesthesia method:  Local infiltration   Local anesthetic:  Lidocaine 2% WITH epi Procedure type:    Complexity:  Simple Procedure details:    Needle aspiration: no     Incision types:  Stab incision   Incision depth:  Subcutaneous   Scalpel blade:  11   Wound management:  Probed and deloculated   Drainage:  Purulent   Drainage amount:  Moderate   Packing materials:  None Post-procedure details:    Patient tolerance of procedure:  Tolerated well, no immediate complications   (including critical care time)  Medications Ordered in UC Medications - No data to display  Initial Impression / Assessment and Plan / UC Course  I have reviewed the triage vital signs and the nursing notes.  Pertinent labs & imaging results that were available during my care of the patient were reviewed by me and considered in my medical decision making (see chart for details).     Final Clinical Impressions(s) / UC Diagnoses   Final diagnoses:  Abscess of face     Discharge Instructions     5 more days of doxycycline twice a day  Wash the abscess area gently with warm soapy water twice a day.  Try to massage any further  debris out of it.    ED  Prescriptions    None     Controlled Substance Prescriptions [place] Controlled Substance Registry consulted? Not Applicable   Robyn Haber, MD 07/21/18 1740

## 2018-08-24 ENCOUNTER — Other Ambulatory Visit: Payer: Self-pay

## 2018-08-24 ENCOUNTER — Encounter (HOSPITAL_BASED_OUTPATIENT_CLINIC_OR_DEPARTMENT_OTHER): Payer: Self-pay | Admitting: *Deleted

## 2018-08-25 ENCOUNTER — Encounter: Payer: Self-pay | Admitting: Family Medicine

## 2018-08-25 ENCOUNTER — Ambulatory Visit (INDEPENDENT_AMBULATORY_CARE_PROVIDER_SITE_OTHER): Payer: 59 | Admitting: Family Medicine

## 2018-08-25 ENCOUNTER — Ambulatory Visit: Payer: 59 | Admitting: Family Medicine

## 2018-08-25 VITALS — BP 114/69 | HR 93 | Temp 97.5°F | Ht 64.0 in | Wt 137.4 lb

## 2018-08-25 DIAGNOSIS — J329 Chronic sinusitis, unspecified: Secondary | ICD-10-CM | POA: Diagnosis not present

## 2018-08-25 DIAGNOSIS — J069 Acute upper respiratory infection, unspecified: Secondary | ICD-10-CM

## 2018-08-25 MED ORDER — AMOXICILLIN-POT CLAVULANATE 875-125 MG PO TABS: 1.0000 | ORAL_TABLET | Freq: Two times a day (BID) | ORAL | 0 refills | Status: DC

## 2018-08-25 NOTE — Progress Notes (Signed)
Subjective:  By signing my name below, I, Donna Mcdowell, attest that this documentation has been prepared under the direction and in the presence of Donna Ray, MD. Electronically Signed: [name], Shorter. 08/25/2018 , 3:10 PM .  Patient was seen in Room 9 .   Patient ID: Donna Mcdowell, female    DOB: Feb 22, 1981, 37 y.o.   MRN: [id number] Chief Complaint  Patient presents with  . Sinusitis    dark flem    HPI Donna Mcdowell is a 37 y.o. female  Patient was seen back in May for some difficulty breathing out of her nostrils. She has a history of partial nasal obstruction, and referred to ENT at that time.   Patient reports her symptoms started like a cold and seasonal allergies about 3 days ago. Then, she noticed being congestion yesterday, and woke up this morning with discolored phlegm. She reports coughing up 2 globs with twinge of Mcdowell. She has had some body aches, mostly from coughing and pressure. When she felt symptoms come on, she started mucinex and flonase. She started Sudafed yesterday, and cough suppressant (Delsym) last night. She denies any fever. She hasn't received flu shot this year. She denies any recent antibiotics. She had light nose bleeding last night after sneezing fit. She hasn't tried saline nasal spray; just been using Flonase daily. She has possible sick contact through work. She denies history of antibiotic allergy; has taken augmentin in the past. She's been staying off work the past few days.   She will have nasal mass removal and deviated septum done by ENT, Dr. Benjamine Mola next week (Nov 19th).   Patient Active Problem List   Diagnosis Date Noted  . Localized swelling, mass or lump of neck 05/26/2017  . Cigarette nicotine dependence with nicotine-induced disorder 11/02/2016  . Encounter for smoking cessation counseling 11/02/2016  . Severe cervical dysplasia    Past Medical History:  Diagnosis Date  . Carbuncle of face    forehead  . Deviated  septum 07/2018  . Nasal mass 07/2018  . Nasal turbinate hypertrophy 07/2018   bilateral   Past Surgical History:  Procedure Laterality Date  . FINGER TENDON REPAIR Right 06/23/2010   long finger  . INCISION AND DRAINAGE BREAST ABSCESS Left 06/09/2011  . TRIGGER FINGER RELEASE Right 06/23/2010   long finger  . WISDOM TOOTH EXTRACTION     Allergies  Allergen Reactions  . Oxycodone Nausea And Vomiting   Prior to Admission medications   Medication Sig Start Date End Date Taking? Authorizing Provider  Dextromethorphan-guaiFENesin [facility] DM PO) Take by mouth.    [provider]  fexofenadine (ALLEGRA) 180 MG tablet Take 180 mg by mouth daily.    [provider]  fluticasone (FLONASE) 50 MCG/ACT nasal spray Place 1 spray into both nostrils daily.    [provider]  ibuprofen (ADVIL,MOTRIN) 200 MG tablet Take 600 mg by mouth 2 (two) times daily as needed for headache.    [provider]  mupirocin cream (BACTROBAN) 2 % Apply 1 application topically 3 (three) times daily.    [provider]  Pseudoephedrine HCl (SUDAFED CONGESTION PO) Take by mouth.    [provider]   Social History   Socioeconomic History  . Marital status: Married    Spouse name: Not on file  . Number of children: Not on file  . Years of education: Not on file  . Highest education level: Not on file  Occupational History  . Not  on file  Social Needs  . Financial resource strain: Not on file  . Food insecurity:    Worry: Not on file    Inability: Not on file  . Transportation needs:    Medical: Not on file    Non-medical: Not on file  Tobacco Use  . Smoking status: Current Every Day Smoker    Packs/day: 1.00    Years: 19.00    Pack years: 19.00    Types: Cigarettes  . Smokeless tobacco: Never Used  Substance and Sexual Activity  . Alcohol use: Yes    Comment: Drinks 2 drinks a day  . Drug use: No  . Sexual activity: Yes    Partners: Female     Comment: Lives with partner  Lifestyle  . Physical activity:    Days per week: Not on file    Minutes per session: Not on file  . Stress: Not on file  Relationships  . Social connections:    Talks on phone: Not on file    Gets together: Not on file    Attends religious service: Not on file    Active member of club or organization: Not on file    Attends meetings of clubs or organizations: Not on file    Relationship status: Not on file  . Intimate partner violence:    Fear of current or ex partner: Not on file    Emotionally abused: Not on file    Physically abused: Not on file    Forced sexual activity: Not on file  Other Topics Concern  . Not on file  Social History Narrative  . Not on file   Review of Systems  Constitutional: Negative for chills, fatigue, fever and unexpected weight change.  HENT: Positive for sinus pressure.   Respiratory: Negative for cough.   Gastrointestinal: Negative for constipation, diarrhea, nausea and vomiting.  Skin: Negative for rash and wound.  Neurological: Negative for dizziness, weakness and headaches.       Objective:   Physical Exam  Constitutional: She is oriented to person, place, and time. She appears well-developed and well-nourished. No distress.  HENT:  Head: Normocephalic and atraumatic.  Right Ear: Hearing, tympanic membrane, external ear and ear canal normal.  Left Ear: Hearing, tympanic membrane, external ear and ear canal normal.  Nose: Nose normal. Right sinus exhibits no maxillary sinus tenderness and no frontal sinus tenderness. Left sinus exhibits no maxillary sinus tenderness and no frontal sinus tenderness.  Mouth/Throat: Oropharynx is clear and moist. No oropharyngeal exudate.  Nose: edema of right turbinate, obstruction of left turbinate; there is a clear-yellow drainage L>R, no apparent bleeding from the septum, no signs of active bleeding  Eyes: Pupils are equal, round, and reactive to light. Conjunctivae and EOM  are normal.  Cardiovascular: Normal rate, regular rhythm, normal heart sounds and intact distal pulses.  No murmur heard. Pulmonary/Chest: Effort normal and breath sounds normal. No respiratory distress. She has no wheezes. She has no rhonchi.  Neurological: She is alert and oriented to person, place, and time.  Skin: Skin is warm and dry. No rash noted.  Psychiatric: She has a normal mood and affect. Her behavior is normal.  Vitals reviewed.   Vitals:   08/25/18 1441  BP: 114/69  Pulse: 93  Temp: (!) 97.5 F (36.4 C)  TempSrc: Oral  SpO2: 97%  Weight: 137 lb 6.4 oz (62.3 kg)  Height: 5\' 4"  (1.626 m)       Assessment & Plan:  Donna Mcdowell is a 37 y.o. female Sinusitis, unspecified chronicity, unspecified location - Plan: amoxicillin-clavulanate (AUGMENTIN) 875-125 MG tablet  Acute upper respiratory infection - Plan: amoxicillin-clavulanate (AUGMENTIN) 875-125 MG tablet  URI with possible early sinusitis, discolored discharge.  Symptoms are early, but with impending nasal surgery decided to start Augmentin at this time.  Potential side effects and risks were discussed, and other symptomatic care discussed per AVS.   Recommended she contact her surgeon's office to advise them of her current symptoms to determine if any change in timing of that procedure needed.   Meds ordered this encounter  Medications  . amoxicillin-clavulanate (AUGMENTIN) 875-125 MG tablet    Sig: Take 1 tablet by mouth 2 (two) times daily.    Dispense:  20 tablet    Refill:  0   Patient Instructions     Saline nasal spray atleast 4 times per day, over the counter mucinex or mucinex DM as needed for cough, drink plenty of fluids.  Start Augmentin twice per day.  I would recommend calling your ear, nose, and throat provider and let them know about your symptoms as well as starting on this antibiotic to see if there are any changes in plans for next week. Thanks for coming in today.   Let me know if  there are questions.   If you have lab work done today you will be contacted with your lab results within the next 2 weeks.  If you have not heard from [place] then please contact us. The fastest way to get your results is to register for My Chart.   IF you received an x-Mcdowell today, you will receive an invoice from [facility]. Please contact [facility] at [phone] with questions or concerns regarding your invoice.   IF you received labwork today, you will receive an invoice from [facility]. Please contact [facility] at [phone] with questions or concerns regarding your invoice.   Our billing staff will not be able to assist you with questions regarding bills from these companies.  You will be contacted with the lab results as soon as they are available. The fastest way to get your results is to activate your My Chart account. Instructions are located on the last page of this paperwork. If you have not heard from [place] regarding the results in 2 weeks, please contact this office.       I personally performed the services described in this documentation, which was scribed in my presence. The recorded information has been reviewed and considered for accuracy and completeness, addended by me as needed, and agree with information above.  Signed,   Donna Ray, MD [facility] [place].  08/29/18 12:00 PM

## 2018-08-25 NOTE — Patient Instructions (Addendum)
   Saline nasal spray atleast 4 times per day, over the counter mucinex or mucinex DM as needed for cough, drink plenty of fluids.  Start Augmentin twice per day.  I would recommend calling your ear, nose, and throat provider and let them know about your symptoms as well as starting on this antibiotic to see if there are any changes in plans for next week. Thanks for coming in today.   Let me know if there are questions.   If you have lab work done today you will be contacted with your lab results within the next 2 weeks.  If you have not heard from [place] then please contact us. The fastest way to get your results is to register for My Chart.   IF you received an x-ray today, you will receive an invoice from [facility]. Please contact [facility] at [phone] with questions or concerns regarding your invoice.   IF you received labwork today, you will receive an invoice from [facility]. Please contact [facility] at [phone] with questions or concerns regarding your invoice.   Our billing staff will not be able to assist you with questions regarding bills from these companies.  You will be contacted with the lab results as soon as they are available. The fastest way to get your results is to activate your My Chart account. Instructions are located on the last page of this paperwork. If you have not heard from [place] regarding the results in 2 weeks, please contact this office.

## 2018-08-30 ENCOUNTER — Ambulatory Visit (HOSPITAL_BASED_OUTPATIENT_CLINIC_OR_DEPARTMENT_OTHER): Payer: 59 | Admitting: Anesthesiology

## 2018-08-30 ENCOUNTER — Ambulatory Visit (HOSPITAL_BASED_OUTPATIENT_CLINIC_OR_DEPARTMENT_OTHER)
Admission: RE | Admit: 2018-08-30 | Discharge: 2018-08-30 | Disposition: A | Discharge status: Home / Self Care | Payer: 59 | Source: Ambulatory Visit | Attending: Otolaryngology | Admitting: Otolaryngology

## 2018-08-30 ENCOUNTER — Encounter (HOSPITAL_BASED_OUTPATIENT_CLINIC_OR_DEPARTMENT_OTHER)
Admission: RE | Disposition: A | Discharge status: Home / Self Care | Payer: Self-pay | Source: Ambulatory Visit | Attending: Otolaryngology

## 2018-08-30 ENCOUNTER — Encounter (HOSPITAL_BASED_OUTPATIENT_CLINIC_OR_DEPARTMENT_OTHER): Payer: Self-pay

## 2018-08-30 ENCOUNTER — Other Ambulatory Visit: Payer: Self-pay

## 2018-08-30 DIAGNOSIS — D140 Benign neoplasm of middle ear, nasal cavity and accessory sinuses: Secondary | ICD-10-CM | POA: Diagnosis not present

## 2018-08-30 DIAGNOSIS — J3489 Other specified disorders of nose and nasal sinuses: Secondary | ICD-10-CM | POA: Diagnosis not present

## 2018-08-30 DIAGNOSIS — R42 Dizziness and giddiness: Secondary | ICD-10-CM | POA: Insufficient documentation

## 2018-08-30 DIAGNOSIS — J342 Deviated nasal septum: Secondary | ICD-10-CM | POA: Insufficient documentation

## 2018-08-30 DIAGNOSIS — J310 Chronic rhinitis: Secondary | ICD-10-CM | POA: Insufficient documentation

## 2018-08-30 DIAGNOSIS — F17200 Nicotine dependence, unspecified, uncomplicated: Secondary | ICD-10-CM | POA: Insufficient documentation

## 2018-08-30 DIAGNOSIS — J343 Hypertrophy of nasal turbinates: Secondary | ICD-10-CM | POA: Insufficient documentation

## 2018-08-30 DIAGNOSIS — D385 Neoplasm of uncertain behavior of other respiratory organs: Secondary | ICD-10-CM

## 2018-08-30 HISTORY — DX: Carbuncle of face: L02.03

## 2018-08-30 HISTORY — PX: EXCISION NASAL MASS: SHX6271

## 2018-08-30 HISTORY — PX: NASAL SEPTOPLASTY W/ TURBINOPLASTY: SHX2070

## 2018-08-30 HISTORY — DX: Other specified disorders of nose and nasal sinuses: J34.89

## 2018-08-30 HISTORY — DX: Hypertrophy of nasal turbinates: J34.3

## 2018-08-30 HISTORY — DX: Deviated nasal septum: J34.2

## 2018-08-30 SURGERY — SEPTOPLASTY, NOSE, WITH NASAL TURBINATE REDUCTION
Anesthesia: General | Site: Nose | Laterality: Left

## 2018-08-30 MED ORDER — FENTANYL CITRATE (PF) 100 MCG/2ML IJ SOLN
INTRAMUSCULAR | Status: AC
  Filled 2018-08-30: qty 2

## 2018-08-30 MED ORDER — LIDOCAINE 2% (20 MG/ML) 5 ML SYRINGE
INTRAMUSCULAR | Status: DC | PRN
  Administered 2018-08-30: 100 mg via INTRAVENOUS

## 2018-08-30 MED ORDER — MEPERIDINE HCL 25 MG/ML IJ SOLN: 6.2500 mg | INTRAMUSCULAR | Status: DC | PRN

## 2018-08-30 MED ORDER — OXYMETAZOLINE HCL 0.05 % NA SOLN
NASAL | Status: DC | PRN
  Administered 2018-08-30: 1 via TOPICAL

## 2018-08-30 MED ORDER — LIDOCAINE 2% (20 MG/ML) 5 ML SYRINGE
INTRAMUSCULAR | Status: AC
  Filled 2018-08-30: qty 5

## 2018-08-30 MED ORDER — ACETAMINOPHEN 10 MG/ML IV SOLN
INTRAVENOUS | Status: AC
  Filled 2018-08-30: qty 100

## 2018-08-30 MED ORDER — FENTANYL CITRATE (PF) 100 MCG/2ML IJ SOLN
50.0000 ug | INTRAMUSCULAR | Status: AC | PRN
  Administered 2018-08-30: 100 ug via INTRAVENOUS
  Administered 2018-08-30 (×4): 50 ug via INTRAVENOUS

## 2018-08-30 MED ORDER — BSS IO SOLN
INTRAOCULAR | Status: AC
  Filled 2018-08-30: qty 15

## 2018-08-30 MED ORDER — DEXAMETHASONE SODIUM PHOSPHATE 4 MG/ML IJ SOLN
INTRAMUSCULAR | Status: DC | PRN
  Administered 2018-08-30: 10 mg via INTRAVENOUS

## 2018-08-30 MED ORDER — SUGAMMADEX SODIUM 200 MG/2ML IV SOLN
INTRAVENOUS | Status: AC
  Filled 2018-08-30: qty 2

## 2018-08-30 MED ORDER — SUGAMMADEX SODIUM 200 MG/2ML IV SOLN
INTRAVENOUS | Status: DC | PRN
  Administered 2018-08-30: 200 mg via INTRAVENOUS

## 2018-08-30 MED ORDER — CEFAZOLIN SODIUM-DEXTROSE 2-3 GM-%(50ML) IV SOLR
INTRAVENOUS | Status: DC | PRN
  Administered 2018-08-30: 2 g via INTRAVENOUS

## 2018-08-30 MED ORDER — ACETAMINOPHEN 10 MG/ML IV SOLN
INTRAVENOUS | Status: DC | PRN
  Administered 2018-08-30: 1000 mg via INTRAVENOUS

## 2018-08-30 MED ORDER — PROPOFOL 10 MG/ML IV BOLUS
INTRAVENOUS | Status: DC | PRN
  Administered 2018-08-30: 150 mg via INTRAVENOUS
  Administered 2018-08-30: 50 mg via INTRAVENOUS

## 2018-08-30 MED ORDER — ONDANSETRON HCL 4 MG/2ML IJ SOLN
INTRAMUSCULAR | Status: AC
  Filled 2018-08-30: qty 2

## 2018-08-30 MED ORDER — MUPIROCIN 2 % EX OINT
TOPICAL_OINTMENT | CUTANEOUS | Status: AC
  Filled 2018-08-30: qty 22

## 2018-08-30 MED ORDER — MIDAZOLAM HCL 2 MG/2ML IJ SOLN
INTRAMUSCULAR | Status: AC
  Filled 2018-08-30: qty 2

## 2018-08-30 MED ORDER — ROCURONIUM BROMIDE 10 MG/ML (PF) SYRINGE
PREFILLED_SYRINGE | INTRAVENOUS | Status: DC | PRN
  Administered 2018-08-30: 40 mg via INTRAVENOUS

## 2018-08-30 MED ORDER — LIDOCAINE-EPINEPHRINE 1 %-1:100000 IJ SOLN
INTRAMUSCULAR | Status: DC | PRN
  Administered 2018-08-30: 3 mL

## 2018-08-30 MED ORDER — SUCCINYLCHOLINE CHLORIDE 20 MG/ML IJ SOLN
INTRAMUSCULAR | Status: DC | PRN
  Administered 2018-08-30: 100 mg via INTRAVENOUS

## 2018-08-30 MED ORDER — PROPOFOL 10 MG/ML IV BOLUS
INTRAVENOUS | Status: AC
  Filled 2018-08-30: qty 20

## 2018-08-30 MED ORDER — HYDROMORPHONE HCL 1 MG/ML IJ SOLN
0.2500 mg | INTRAMUSCULAR | Status: DC | PRN
  Administered 2018-08-30: 0.5 mg via INTRAVENOUS

## 2018-08-30 MED ORDER — PROMETHAZINE HCL 25 MG/ML IJ SOLN: 6.2500 mg | INTRAMUSCULAR | Status: DC | PRN

## 2018-08-30 MED ORDER — ROCURONIUM BROMIDE 50 MG/5ML IV SOSY
PREFILLED_SYRINGE | INTRAVENOUS | Status: AC
  Filled 2018-08-30: qty 5

## 2018-08-30 MED ORDER — SUCCINYLCHOLINE CHLORIDE 200 MG/10ML IV SOSY
PREFILLED_SYRINGE | INTRAVENOUS | Status: AC
  Filled 2018-08-30: qty 10

## 2018-08-30 MED ORDER — DEXAMETHASONE SODIUM PHOSPHATE 10 MG/ML IJ SOLN
INTRAMUSCULAR | Status: AC
  Filled 2018-08-30: qty 1

## 2018-08-30 MED ORDER — ONDANSETRON HCL 4 MG/2ML IJ SOLN
INTRAMUSCULAR | Status: DC | PRN
  Administered 2018-08-30: 4 mg via INTRAVENOUS

## 2018-08-30 MED ORDER — AMOXICILLIN 875 MG PO TABS: 875.0000 mg | ORAL_TABLET | Freq: Two times a day (BID) | ORAL | 0 refills | Status: AC

## 2018-08-30 MED ORDER — HYDROMORPHONE HCL 1 MG/ML IJ SOLN
INTRAMUSCULAR | Status: AC
  Filled 2018-08-30: qty 0.5

## 2018-08-30 MED ORDER — FENTANYL CITRATE (PF) 100 MCG/2ML IJ SOLN
25.0000 ug | INTRAMUSCULAR | Status: DC | PRN
  Administered 2018-08-30 (×2): 25 ug via INTRAVENOUS

## 2018-08-30 MED ORDER — HYDROCODONE-ACETAMINOPHEN 7.5-325 MG PO TABS: 1.0000 | ORAL_TABLET | Freq: Once | ORAL | Status: DC | PRN

## 2018-08-30 MED ORDER — HYDROCODONE-ACETAMINOPHEN 5-325 MG PO TABS: 1.0000 | ORAL_TABLET | ORAL | 0 refills | Status: DC | PRN

## 2018-08-30 MED ORDER — MIDAZOLAM HCL 2 MG/2ML IJ SOLN
1.0000 mg | INTRAMUSCULAR | Status: DC | PRN
  Administered 2018-08-30: 2 mg via INTRAVENOUS

## 2018-08-30 MED ORDER — BSS IO SOLN
INTRAOCULAR | Status: DC | PRN
  Administered 2018-08-30: 1 via INTRAOCULAR

## 2018-08-30 MED ORDER — LACTATED RINGERS IV SOLN
INTRAVENOUS | Status: DC
  Administered 2018-08-30 (×2): via INTRAVENOUS

## 2018-08-30 MED ORDER — SCOPOLAMINE 1 MG/3DAYS TD PT72: 1.0000 | MEDICATED_PATCH | Freq: Once | TRANSDERMAL | Status: DC | PRN

## 2018-08-30 MED ORDER — MUPIROCIN 2 % EX OINT
TOPICAL_OINTMENT | CUTANEOUS | Status: DC | PRN
  Administered 2018-08-30: 1 via TOPICAL

## 2018-08-30 SURGICAL SUPPLY — 43 items
ATTRACTOMAT 16X20 MAGNETIC DRP (DRAPES) IMPLANT
BLADE SURG 15 STRL LF DISP TIS (BLADE) IMPLANT
BLADE SURG 15 STRL SS (BLADE)
BLADE TRICUT ROTATE M4 4 5PK (BLADE) ×1 IMPLANT
CANISTER SUC SOCK COL 7 IN (MISCELLANEOUS) ×1 IMPLANT
CANISTER SUCT 1200ML W/VALVE (MISCELLANEOUS) ×3 IMPLANT
COAGULATOR SUCT 8FR VV (MISCELLANEOUS) ×3 IMPLANT
COVER WAND RF STERILE (DRAPES) IMPLANT
DECANTER SPIKE VIAL GLASS SM (MISCELLANEOUS) IMPLANT
DRSG NASOPORE 8CM (GAUZE/BANDAGES/DRESSINGS) IMPLANT
DRSG TELFA 3X8 NADH (GAUZE/BANDAGES/DRESSINGS) IMPLANT
ELECT REM PT RETURN 9FT ADLT (ELECTROSURGICAL) ×3
ELECTRODE REM PT RTRN 9FT ADLT (ELECTROSURGICAL) ×2 IMPLANT
GLOVE BIO SURGEON STRL SZ7.5 (GLOVE) ×3 IMPLANT
GLOVE BIOGEL PI IND STRL 8 (GLOVE) IMPLANT
GLOVE BIOGEL PI INDICATOR 8 (GLOVE) ×1
GLOVE SURG SYN 8.0 (GLOVE) ×3 IMPLANT
GLOVE SURG SYN 8.0 PF PI (GLOVE) IMPLANT
GOWN STRL REIN XL XLG (GOWN DISPOSABLE) ×1 IMPLANT
GOWN STRL REUS W/ TWL LRG LVL3 (GOWN DISPOSABLE) ×4 IMPLANT
GOWN STRL REUS W/TWL LRG LVL3 (GOWN DISPOSABLE) ×3
NDL HYPO 25X1 1.5 SAFETY (NEEDLE) ×2 IMPLANT
NEEDLE HYPO 25X1 1.5 SAFETY (NEEDLE) ×3 IMPLANT
NS IRRIG 1000ML POUR BTL (IV SOLUTION) ×3 IMPLANT
PACK BASIN DAY SURGERY FS (CUSTOM PROCEDURE TRAY) ×3 IMPLANT
PACK ENT DAY SURGERY (CUSTOM PROCEDURE TRAY) ×3 IMPLANT
PACKING NASAL EPIS 4X2.4 XEROG (MISCELLANEOUS) IMPLANT
PAD DRESSING TELFA 3X8 NADH (GAUZE/BANDAGES/DRESSINGS) IMPLANT
SCRUB TECHNI CARE SURGICAL (MISCELLANEOUS) IMPLANT
SLEEVE SCD COMPRESS KNEE MED (MISCELLANEOUS) ×1 IMPLANT
SOLUTION BUTLER CLEAR DIP (MISCELLANEOUS) ×3 IMPLANT
SPLINT NASAL AIRWAY SILICONE (MISCELLANEOUS) ×3 IMPLANT
SPONGE GAUZE 2X2 8PLY STRL LF (GAUZE/BANDAGES/DRESSINGS) ×3 IMPLANT
SPONGE NEURO XRAY DETECT 1X3 (DISPOSABLE) ×3 IMPLANT
SUT CHROMIC 4 0 P 3 18 (SUTURE) ×3 IMPLANT
SUT PLAIN 4 0 ~~LOC~~ 1 (SUTURE) ×3 IMPLANT
SUT PROLENE 3 0 PS 2 (SUTURE) ×3 IMPLANT
SUT VIC AB 4-0 P-3 18XBRD (SUTURE) IMPLANT
SUT VIC AB 4-0 P3 18 (SUTURE)
TOWEL GREEN STERILE FF (TOWEL DISPOSABLE) ×3 IMPLANT
TUBE SALEM SUMP 12R W/ARV (TUBING) IMPLANT
TUBE SALEM SUMP 16 FR W/ARV (TUBING) ×3 IMPLANT
YANKAUER SUCT BULB TIP NO VENT (SUCTIONS) ×3 IMPLANT

## 2018-08-30 NOTE — Anesthesia Postprocedure Evaluation (Signed)
Anesthesia Post Note  Patient: Donna Mcdowell  Procedure(s) Performed: NASAL SEPTOPLASTY WITH TURBINATE REDUCTION (Bilateral Nose) EXCISION OF NASAL MASS (Left Nose)     Patient location during evaluation: PACU Anesthesia Type: General Level of consciousness: sedated and patient cooperative Pain management: pain level controlled Vital Signs Assessment: post-procedure vital signs reviewed and stable Respiratory status: spontaneous breathing Cardiovascular status: stable Anesthetic complications: no    Last Vitals:  Vitals:   08/30/18 1415 08/30/18 1440  BP: 124/76 119/71  Pulse: 78 78  Resp: 17 18  Temp:  37 C  SpO2: 94% 95%    Last Pain:  Vitals:   08/30/18 1440  TempSrc:   PainSc: [place]

## 2018-08-30 NOTE — H&P (Signed)
Cc: Chronic nasal obstruction  HPI: The patient is a 37 year old female who returns today for her follow-up evaluation. The patient was previously seen for chronic nasal obstruction and recurrent dizziness.  At her last visit 1 month ago, the patient was noted to have a soft tissue mass within the left nasal cavity. She also has nasal septal deviation and bilateral inferior turbinate hypertrophy.  She underwent biopsy of the nasal mass.  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 The patient also has a history of recurrent dizziness.  Her otologic and neuro-otologic evaluations were previously unremarkable.  However, she has been experiencing more difficulty with her balance. She was recently seen at the E.R.  Her MRI scan was negative. The patient believes her dizziness is secondary to vestibular dysfunction. No other ENT, GI, or respiratory issue noted since the last visit.   Exam: General: Communicates without difficulty, well nourished, no acute distress. Head: Normocephalic, no evidence injury, no tenderness, facial buttresses intact without stepoff. Face/sinus: No tenderness to palpation and percussion. Facial movement is normal and symmetric. Eyes: PERRL, EOMI. No scleral icterus, conjunctivae clear. Neuro: CN II exam reveals vision grossly intact.  No nystagmus at any point of gaze. Ears: Auricles well formed without lesions.  Ear canals are intact without mass or lesion.  No erythema or edema is appreciated.  The TMs are intact without fluid. Nose: External evaluation reveals normal support and skin without lesions.  Dorsum is intact.  Anterior rhinoscopy reveals congested mucosa over anterior aspect of inferior turbinates and deviated septum. A soft tissue mass is noted in the left nasal cavity.  Oral:  Oral cavity and oropharynx are intact, symmetric, without erythema or edema.  Mucosa is moist without lesions. Neck: Full range of motion without pain.  There is no significant lymphadenopathy.  No  masses palpable.  Thyroid bed within normal limits to palpation.  Parotid glands and submandibular glands equal bilaterally without mass.  Trachea is midline. Neuro:  CN 2-12 grossly intact. Gait normal. Vestibular: No nystagmus at any point of gaze. Dix Hallpike negative. The cerebellar examination is unremarkable.   Assessment: 1.  Chronic rhinitis with nasal mucosal congestion, nasal septal deviation, and bilateral inferior turbinate hypertrophy.  The patient also has a large soft tissue mass within the anterior portion of her left nasal cavity. 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  2.  Frequent recurrent dizziness of unknown etiology.  The patient is interested in referral to a tertiary care neurootologist.    Plan: 1.  The physical exam findings and the pathology results are reviewed with the patient.  2.  Based on her persistent nasal obstruction, she is a candidate to undergo surgical intervention, with septoplasty, turbinate reduction, and excision of nasal papilloma.  The risks, benefits, and details of the procedures are extensively reviewed with the patient. Questions are invited and answered.  3.  A referral to a tertiary care neuro-otologist will be made as soon as possible.

## 2018-08-30 NOTE — Discharge Instructions (Addendum)

## 2018-08-30 NOTE — Anesthesia Procedure Notes (Signed)
Procedure Name: Intubation Date/Time: 08/30/2018 11:01 AM Performed by: Lieutenant Diego, CRNA Pre-anesthesia Checklist: Patient identified, Emergency Drugs available, Suction available and Patient being monitored Patient Re-evaluated:Patient Re-evaluated prior to induction Oxygen Delivery Method: Circle system utilized Preoxygenation: Pre-oxygenation with 100% oxygen Induction Type: IV induction Ventilation: Mask ventilation without difficulty Laryngoscope Size: Miller and 2 Grade View: Grade I Tube type: Oral Tube size: 7.0 mm Number of attempts: 1 Airway Equipment and Method: Stylet and Oral airway Placement Confirmation: ETT inserted through vocal cords under direct vision,  positive ETCO2 and breath sounds checked- equal and bilateral Secured at: 22 cm Tube secured with: Tape Dental Injury: Teeth and Oropharynx as per pre-operative assessment

## 2018-08-30 NOTE — Anesthesia Preprocedure Evaluation (Addendum)
Anesthesia Evaluation  Patient identified by MRN, date of birth, ID band Patient awake    Reviewed: Allergy & Precautions, NPO status , Patient's Chart, lab work & pertinent test results  Airway Mallampati: II  TM Distance: >3 FB Neck ROM: Full    Dental no notable dental hx.    Pulmonary Current Smoker,    Pulmonary exam normal breath sounds clear to auscultation       Cardiovascular negative cardio ROS Normal cardiovascular exam Rhythm:Regular Rate:Normal     Neuro/Psych negative neurological ROS  negative psych ROS   GI/Hepatic negative GI ROS, Neg liver ROS,   Endo/Other  negative endocrine ROS  Renal/GU negative Renal ROS     Musculoskeletal negative musculoskeletal ROS (+)   Abdominal   Peds  Hematology negative hematology ROS (+)   Anesthesia Other Findings   Reproductive/Obstetrics negative OB ROS                            Anesthesia Physical Anesthesia Plan  ASA: II  Anesthesia Plan: General   Post-op Pain Management:    Induction: Intravenous  PONV Risk Score and Plan: 2 and Ondansetron and Dexamethasone  Airway Management Planned: Oral ETT  Additional Equipment: None  Intra-op Plan:   Post-operative Plan: Extubation in OR  Informed Consent: I have reviewed the patients History and Physical, chart, labs and discussed the procedure including the risks, benefits and alternatives for the proposed anesthesia with the patient or authorized representative who has indicated his/her understanding and acceptance.   Dental advisory given  Plan Discussed with: CRNA  Anesthesia Plan Comments:         Anesthesia Quick Evaluation

## 2018-08-30 NOTE — Op Note (Signed)
DATE OF PROCEDURE: 08/30/2018  OPERATIVE REPORT   SURGEON: Leta Baptist, MD   PREOPERATIVE DIAGNOSES:  1. Severe nasal septal deviation.  2. Bilateral inferior turbinate hypertrophy.  3. Chronic nasal obstruction. 4. Left nasal mass.   POSTOPERATIVE DIAGNOSES:  1. Severe nasal septal deviation.  2. Bilateral inferior turbinate hypertrophy.  3. Chronic nasal obstruction. 4. Left nasal mass.   PROCEDURE PERFORMED:  1. Septoplasty.  2. Bilateral partial inferior turbinate resection.  3. Endoscopic resection of left nasal mass  ANESTHESIA: General endotracheal tube anesthesia.   COMPLICATIONS: None.   ESTIMATED BLOOD LOSS: 200 mL.   INDICATION FOR PROCEDURE: Donna Mcdowell is a 36 y.o. female with a history of chronic nasal obstruction and a left nasal mass. 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 The patient was  treated with antihistamine, decongestant, and steroid nasal sprays. However, the patient continued to be symptomatic. On examination, the patient was noted to have bilateral severe inferior turbinate hypertrophy and significant nasal septal deviation, causing significant nasal obstruction. Based on the above findings, the decision was made for the patient to undergo the above-stated procedures. The risks, benefits, alternatives, and details of the procedures were discussed with the patient. Questions were invited and answered. Informed consent was obtained.   DESCRIPTION OF PROCEDURE: The patient was taken to the operating room and placed supine on the operating table. General endotracheal tube anesthesia was administered by the anesthesiologist. The patient was positioned, and prepped and draped in the standard fashion for nasal surgery. Pledgets soaked with Afrin were placed in both nasal cavities for decongestion. The pledgets were subsequently removed. The above mentioned severe septal deviation was again noted. Using a 0 scope, the left nasal cavity was examined.  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 The lesions were removed using a combination of microdebrider and Blakesley forceps. The specimens were sent to the pathology department for permanent histologic identification.   1% lidocaine with 1:100,000 epinephrine was injected onto the nasal septum bilaterally. A hemitransfixion incision was made on the left side. The mucosal flap was carefully elevated on the left side. A cartilaginous incision was made 1 cm superior to the caudal margin of the nasal septum. Mucosal flap was also elevated on the right side in the similar fashion. It should be noted that due to the severe septal deviation, the deviated portion of the cartilaginous and bony septum had to be removed in piecemeal fashion. Once the deviated portions were removed, a straight midline septum was achieved. The septum was then quilted with 4-0 plain gut sutures. The hemitransfixion incision was closed with interrupted 4-0 chromic sutures. Doyle splints were applied.   Prior to the [facility] splint application, the inferior one half of both hypertrophied inferior turbinate was crossclamped with a Kelly clamp. The inferior one half of each inferior turbinate was then resected with a pair of cross cutting scissors. Hemostasis was achieved with a suction cautery device.   The care of the patient was turned over to the anesthesiologist. The patient was awakened from anesthesia without difficulty. The patient was extubated and transferred to the recovery room in good condition.   OPERATIVE FINDINGS: Severe nasal septal deviation and bilateral inferior turbinate hypertrophy. 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  SPECIMEN: Left nasal papillomas.  FOLLOWUP CARE: The patient be discharged home once she is awake and alert. The patient will be placed on Vicodin 1 tablet p.o. q.4 hours p.r.n. pain, and amoxicillin 875 mg p.o.  b.i.d. for 5  days. The patient will follow up in my office in approximately 1 week for splint removal.   Jadae Steinke Raynelle Bring, MD

## 2018-08-30 NOTE — Transfer of Care (Signed)
Immediate Anesthesia Transfer of Care Note  Patient: Donna Mcdowell  Procedure(s) Performed: NASAL SEPTOPLASTY WITH TURBINATE REDUCTION (Bilateral Nose) EXCISION OF NASAL MASS (Left Nose)  Patient Location: PACU  Anesthesia Type:General  Level of Consciousness: awake  Airway & Oxygen Therapy: Patient Spontanous Breathing and Patient connected to face mask oxygen  Post-op Assessment: Report given to RN and Post -op Vital signs reviewed and stable  Post vital signs: Reviewed and stable  Last Vitals:  Vitals Value Taken Time  BP 133/77 08/30/2018 12:30 PM  Temp    Pulse 95 08/30/2018 12:30 PM  Resp 12 08/30/2018 12:30 PM  SpO2 95 % 08/30/2018 12:30 PM  Vitals shown include unvalidated device data.  Last Pain:  Vitals:   08/30/18 0908  TempSrc: Oral  PainSc: 0-No pain         Complications: No apparent anesthesia complications

## 2018-08-30 NOTE — Addendum Note (Signed)
Addendum  created 08/30/18 2236 by Nolon Nations, MD   Intraprocedure Meds edited

## 2018-08-31 ENCOUNTER — Encounter (HOSPITAL_BASED_OUTPATIENT_CLINIC_OR_DEPARTMENT_OTHER): Payer: Self-pay | Admitting: Otolaryngology

## 2018-09-22 NOTE — Progress Notes (Signed)
37 y.o. G0P0 Married White or Caucasian Not Hispanic or Latino female here for annual exam.   Patient's mother with breast cancer at 58. Never had genetic testing.   Menses q month x 3-5 days. Saturates a super tampon in 3-4 hours. No BTB. Cramps vary from minimal to moderate. Helped with advil.   She has been having some dizzy spells since early summer, somewhat better since her nasal surgery. Still having     Patient's last menstrual period was 09/18/2018.          Sexually active: Yes.    The current method of family planning is none.    Exercising: Yes.    full body Smoker:  Yes, 1 PPD  Health Maintenance: Pap:  09/23/2017 WNL NEG HR HPV, 01-05-14 WNL NEG HR HPV  History of abnormal Pap:  Yes LEEP for CIN III in 5/14 MMG:  06-24-17 right MMG MM WNL repeat at age 75 Colonoscopy:  Never BMD:   Never TDaP:  09-04-14 Gardasil: no     reports that she has been smoking cigarettes. She has a 19.00 pack-year smoking history. She has never used smokeless tobacco. She reports current alcohol use. She reports that she does not use drugs. Drinks about 10 drinks a week (varies, can be none). 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 Wife is a Corporate treasurer  Past Medical History:  Diagnosis Date  . Carbuncle of face    forehead  . Deviated septum 07/2018  . Nasal mass 07/2018  . Nasal turbinate hypertrophy 07/2018   bilateral    Past Surgical History:  Procedure Laterality Date  . EXCISION NASAL MASS Left 08/30/2018   Procedure: EXCISION OF NASAL MASS;  Surgeon: Leta Baptist, MD;  Location: [place];  Service: ENT;  Laterality: Left;  . FINGER TENDON REPAIR Right 06/23/2010   long finger  . INCISION AND DRAINAGE BREAST ABSCESS Left 06/09/2011  . NASAL SEPTOPLASTY W/ TURBINOPLASTY Bilateral 08/30/2018   Procedure: NASAL SEPTOPLASTY WITH TURBINATE REDUCTION;  Surgeon: Leta Baptist, MD;  Location: [place];  Service: ENT;   Laterality: Bilateral;  . TRIGGER FINGER RELEASE Right 06/23/2010   long finger  . WISDOM TOOTH EXTRACTION      Current Outpatient Medications  Medication Sig Dispense Refill  . acetaminophen (TYLENOL) 500 MG tablet Take 1,000 mg by mouth every 6 (six) hours as needed.    . cetirizine (ZYRTEC) 10 MG tablet Take 10 mg by mouth daily.    . fluticasone (FLONASE) 50 MCG/ACT nasal spray Place into both nostrils daily.    . Ibuprofen (ADVIL PO) Take by mouth as needed.    Marland Kitchen Dextromethorphan-guaiFENesin (Regino Ramirez DM PO) Take by mouth.     No current facility-administered medications for this visit.     Family History  Problem Relation Age of Onset  . Breast cancer Mother 57  . Cancer Mother        Breast  . Osteoarthritis Maternal Grandmother   . Cancer Maternal Grandmother        Lung  . Osteoarthritis Maternal Grandfather   . Heart failure Maternal Grandfather     Review of Systems  Constitutional: Negative.   HENT: Negative.   Eyes: Negative.   Respiratory: Negative.   Cardiovascular: Negative.   Gastrointestinal: Negative.   Endocrine: Negative.   Genitourinary: Negative.   Musculoskeletal: Negative.   Skin: Negative.   Allergic/Immunologic: Negative.   Neurological: Negative.   Hematological: Negative.  Psychiatric/Behavioral: Negative.     Exam:   BP 106/60 (BP Location: Right Arm, Patient Position: Sitting, Cuff Size: Normal)   Pulse 76   Resp 16   Ht 5\' 4"  (1.626 m)   Wt 142 lb (64.4 kg)   LMP 09/18/2018   BMI 24.37 kg/m   Weight change: @WEIGHTCHANGE @ Height:   Height: 5\' 4"  (162.6 cm)  Ht Readings from Last 3 Encounters:  09/23/18 5\' 4"  (1.626 m)  08/30/18 5\' 4"  (1.626 m)  08/25/18 5\' 4"  (1.626 m)    General appearance: alert, cooperative and appears stated age Head: Normocephalic, without obvious abnormality, atraumatic Neck: no adenopathy, supple, symmetrical, trachea midline and thyroid normal to inspection and palpation Lungs: clear to  auscultation bilaterally Cardiovascular: regular rate and rhythm Breasts: normal appearance, no masses or tenderness Abdomen: soft, non-tender; non distended,  no masses,  no organomegaly Extremities: extremities normal, atraumatic, no cyanosis or edema Skin: Skin color, texture, turgor normal. No rashes or lesions Lymph nodes: Cervical, supraclavicular, and axillary nodes normal. No abnormal inguinal nodes palpated Neurologic: Grossly normal   Pelvic: External genitalia:  no lesions              Urethra:  normal appearing urethra with no masses, tenderness or lesions              Bartholins and Skenes: normal                 Vagina: normal appearing vagina with normal color and discharge, no lesions              Cervix: no lesions               Bimanual Exam:  Uterus:  normal size, contour, position, consistency, mobility, non-tender              Adnexa: no mass, fullness, tenderness               Rectovaginal: declines  Chaperone was present for exam.  A:  Well Woman with normal exam  FH breast cancer  P:   She will discuss genetic testing or consult with a genetic counselor with her mother  Discussed breast self exam  Discussed calcium and vit D intake  Mammogram

## 2018-09-23 ENCOUNTER — Other Ambulatory Visit: Payer: Self-pay

## 2018-09-23 ENCOUNTER — Ambulatory Visit (INDEPENDENT_AMBULATORY_CARE_PROVIDER_SITE_OTHER): Payer: 59 | Admitting: Obstetrics and Gynecology

## 2018-09-23 ENCOUNTER — Encounter: Payer: Self-pay | Admitting: Obstetrics and Gynecology

## 2018-09-23 VITALS — BP 106/60 | HR 76 | Resp 16 | Ht 64.0 in | Wt 142.0 lb

## 2018-09-23 DIAGNOSIS — Z01419 Encounter for gynecological examination (general) (routine) without abnormal findings: Secondary | ICD-10-CM | POA: Diagnosis not present

## 2018-09-23 DIAGNOSIS — Z803 Family history of malignant neoplasm of breast: Secondary | ICD-10-CM | POA: Diagnosis not present

## 2018-09-23 NOTE — Patient Instructions (Signed)
EXERCISE AND DIET:  We recommended that you start or continue a regular exercise program for good health. Regular exercise means any activity that makes your heart beat faster and makes you sweat.  We recommend exercising at least 30 minutes per day at least 3 days a week, preferably 4 or 5.  We also recommend a diet low in fat and sugar.  Inactivity, poor dietary choices and obesity can cause diabetes, heart attack, stroke, and kidney damage, among others.    ALCOHOL AND SMOKING:  Women should limit their alcohol intake to no more than 7 drinks/beers/glasses of wine (combined, not each!) per week. Moderation of alcohol intake to this level decreases your risk of breast cancer and liver damage. And of course, no recreational drugs are part of a healthy lifestyle.  And absolutely no smoking or even second hand smoke. Most people know smoking can cause heart and lung diseases, but did you know it also contributes to weakening of your bones? Aging of your skin?  Yellowing of your teeth and nails?  CALCIUM AND VITAMIN D:  Adequate intake of calcium and Vitamin D are recommended.  The recommendations for exact amounts of these supplements seem to change often, but generally speaking 1,000 mg of calcium (either carbonate or citrate) and 800 units of Vitamin D per day seems prudent. Certain women may benefit from higher intake of Vitamin D.  If you are among these women, your doctor will have told you during your visit.    PAP SMEARS:  Pap smears, to check for cervical cancer or precancers,  have traditionally been done yearly, although recent scientific advances have shown that most women can have pap smears less often.  However, every woman still should have a physical exam from her gynecologist every year. It will include a breast check, inspection of the vulva and vagina to check for abnormal growths or skin changes, a visual exam of the cervix, and then an exam to evaluate the size and shape of the uterus and  ovaries.  And after 35 years of age, a rectal exam is indicated to check for rectal cancers. We will also provide age appropriate advice regarding health maintenance, like when you should have certain vaccines, screening for sexually transmitted diseases, bone density testing, colonoscopy, mammograms, etc.   MAMMOGRAMS:  All women over 35 years old should have a yearly mammogram. Many facilities now offer a "3D" mammogram, which may cost around $50 extra out of pocket. If possible,  we recommend you accept the option to have the 3D mammogram performed.  It both reduces the number of women who will be called back for extra views which then turn out to be normal, and it is better than the routine mammogram at detecting truly abnormal areas.    COLONOSCOPY:  Colonoscopy to screen for colon cancer is recommended for all women at age 35.  We know, you hate the idea of the prep.  We agree, BUT, having colon cancer and not knowing it is worse!!  Colon cancer so often starts as a polyp that can be seen and removed at colonscopy, which can quite literally save your life!  And if your first colonoscopy is normal and you have no family history of colon cancer, most women don't have to have it again for 10 years.  Once every ten years, you can do something that may end up saving your life, right?  We will be happy to help you get it scheduled when you are ready.  Be sure to check your insurance coverage so you understand how much it will cost.  It may be covered as a preventative service at no cost, but you should check your particular policy.      Breast Self-Awareness Breast self-awareness means being familiar with how your breasts look and feel. It involves checking your breasts regularly and reporting any changes to your health care provider. Practicing breast self-awareness is important. A change in your breasts can be a sign of a serious medical problem. Being familiar with how your breasts look and feel allows  you to find any problems early, when treatment is more likely to be successful. All women should practice breast self-awareness, including women who have had breast implants. How to do a breast self-exam One way to learn what is normal for your breasts and whether your breasts are changing is to do a breast self-exam. To do a breast self-exam: Look for Changes  1. Remove all the clothing above your waist. 2. Stand in front of a mirror in a room with good lighting. 3. Put your hands on your hips. 4. Push your hands firmly downward. 5. Compare your breasts in the mirror. Look for differences between them (asymmetry), such as: ? Differences in shape. ? Differences in size. ? Puckers, dips, and bumps in one breast and not the other. 6. Look at each breast for changes in your skin, such as: ? Redness. ? Scaly areas. 7. Look for changes in your nipples, such as: ? Discharge. ? Bleeding. ? Dimpling. ? Redness. ? A change in position. Feel for Changes  Carefully feel your breasts for lumps and changes. It is best to do this while lying on your back on the floor and again while sitting or standing in the shower or tub with soapy water on your skin. Feel each breast in the following way:  Place the arm on the side of the breast you are examining above your head.  Feel your breast with the other hand.  Start in the nipple area and make  inch (2 cm) overlapping circles to feel your breast. Use the pads of your three middle fingers to do this. Apply light pressure, then medium pressure, then firm pressure. The light pressure will allow you to feel the tissue closest to the skin. The medium pressure will allow you to feel the tissue that is a little deeper. The firm pressure will allow you to feel the tissue close to the ribs.  Continue the overlapping circles, moving downward over the breast until you feel your ribs below your breast.  Move one finger-width toward the center of the body.  Continue to use the  inch (2 cm) overlapping circles to feel your breast as you move slowly up toward your collarbone.  Continue the up and down exam using all three pressures until you reach your armpit.  Write Down What You Find  Write down what is normal for each breast and any changes that you find. Keep a written record with breast changes or normal findings for each breast. By writing this information down, you do not need to depend only on memory for size, tenderness, or location. Write down where you are in your menstrual cycle, if you are still menstruating. If you are having trouble noticing differences in your breasts, do not get discouraged. With time you will become more familiar with the variations in your breasts and more comfortable with the exam. How often should I examine my breasts? Examine   your breasts every month. If you are breastfeeding, the best time to examine your breasts is after a feeding or after using a breast pump. If you menstruate, the best time to examine your breasts is 5-7 days after your period is over. During your period, your breasts are lumpier, and it may be more difficult to notice changes. When should I see my health care provider? See your health care provider if you notice:  A change in shape or size of your breasts or nipples.  A change in the skin of your breast or nipples, such as a reddened or scaly area.  Unusual discharge from your nipples.  A lump or thick area that was not there before.  Pain in your breasts.  Anything that concerns you.  This information is not intended to replace advice given to you by your health care provider. Make sure you discuss any questions you have with your health care provider. Document Released: 09/28/2005 Document Revised: 03/05/2016 Document Reviewed: 08/18/2015 Elsevier Interactive Patient Education  2018 Elsevier Inc[employer]  

## 2018-09-28 ENCOUNTER — Ambulatory Visit: Payer: 59 | Admitting: Obstetrics and Gynecology

## 2018-11-29 ENCOUNTER — Encounter: Payer: Self-pay | Admitting: Family Medicine

## 2018-11-29 ENCOUNTER — Ambulatory Visit
Admission: EM | Admit: 2018-11-29 | Discharge: 2018-11-29 | Disposition: A | Discharge status: Home / Self Care | Payer: 59 | Attending: Family Medicine | Admitting: Family Medicine

## 2018-11-29 DIAGNOSIS — F17210 Nicotine dependence, cigarettes, uncomplicated: Secondary | ICD-10-CM | POA: Diagnosis not present

## 2018-11-29 DIAGNOSIS — R69 Illness, unspecified: Principal | ICD-10-CM

## 2018-11-29 DIAGNOSIS — J111 Influenza due to unidentified influenza virus with other respiratory manifestations: Secondary | ICD-10-CM

## 2018-11-29 DIAGNOSIS — R05 Cough: Secondary | ICD-10-CM | POA: Diagnosis not present

## 2018-11-29 DIAGNOSIS — R6883 Chills (without fever): Secondary | ICD-10-CM

## 2018-11-29 MED ORDER — BENZONATATE 100 MG PO CAPS: 100.0000 mg | ORAL_CAPSULE | Freq: Three times a day (TID) | ORAL | 0 refills | Status: DC | PRN

## 2018-11-29 MED ORDER — OSELTAMIVIR PHOSPHATE 75 MG PO CAPS: 75.0000 mg | ORAL_CAPSULE | Freq: Two times a day (BID) | ORAL | 0 refills | Status: DC

## 2018-11-29 NOTE — ED Provider Notes (Signed)
[facility]    CSN: [id number] Arrival date & time: 11/29/18  1005     History   Chief Complaint Chief Complaint  Patient presents with  . Influenza    HPI Donna Mcdowell is a 38 y.o. female.   38 yo woman with smoking hx, here at [facility] for first time, complains of cough.  Symptoms began last night.  She works in Ambulance person.  No fever but having chills and sweats.     Past Medical History:  Diagnosis Date  . Carbuncle of face    forehead  . Deviated septum 07/2018  . Nasal mass 07/2018  . Nasal turbinate hypertrophy 07/2018   bilateral    Patient Active Problem List   Diagnosis Date Noted  . Localized swelling, mass or lump of neck 05/26/2017  . Cigarette nicotine dependence with nicotine-induced disorder 11/02/2016  . Encounter for smoking cessation counseling 11/02/2016  . Severe cervical dysplasia     Past Surgical History:  Procedure Laterality Date  . EXCISION NASAL MASS Left 08/30/2018   Procedure: EXCISION OF NASAL MASS;  Surgeon: Leta Baptist, MD;  Location: [place];  Service: ENT;  Laterality: Left;  . FINGER TENDON REPAIR Right 06/23/2010   long finger  . INCISION AND DRAINAGE BREAST ABSCESS Left 06/09/2011  . NASAL SEPTOPLASTY W/ TURBINOPLASTY Bilateral 08/30/2018   Procedure: NASAL SEPTOPLASTY WITH TURBINATE REDUCTION;  Surgeon: Leta Baptist, MD;  Location: [place];  Service: ENT;  Laterality: Bilateral;  . TRIGGER FINGER RELEASE Right 06/23/2010   long finger  . WISDOM TOOTH EXTRACTION      OB History    Gravida  0   Para      Term      Preterm      AB      Living        SAB      TAB      Ectopic      Multiple      Live Births               Home Medications    Prior to Admission medications   Medication Sig Start Date End Date Taking? Authorizing Provider  acetaminophen (TYLENOL) 500 MG tablet Take 1,000 mg by mouth every 6 (six) hours as needed.    [provider]  benzonatate (TESSALON) 100 MG capsule Take 1-2 capsules (100-200 mg total) by mouth 3 (three) times daily as needed for cough. 11/29/18   Robyn Haber, MD  cetirizine (ZYRTEC) 10 MG tablet Take 10 mg by mouth daily.    [provider]  Ibuprofen (ADVIL PO) Take by mouth as needed.    [provider]  oseltamivir (TAMIFLU) 75 MG capsule Take 1 capsule (75 mg total) by mouth every 12 (twelve) hours. 11/29/18   Robyn Haber, MD    Family History Family History  Problem Relation Age of Onset  . Breast cancer Mother 31  . Cancer Mother        Breast  . Osteoarthritis Maternal Grandmother   . Cancer Maternal Grandmother        Lung  . Osteoarthritis Maternal Grandfather   . Heart failure Maternal Grandfather     Social History Social History   Tobacco Use  . Smoking status: Current Every Day Smoker    Packs/day: 1.00    Years: 19.00    Pack years: 19.00    Types: Cigarettes  . Smokeless tobacco: Never Used  Substance Use Topics  . Alcohol use: Yes    Comment: Drinks 2 drinks a day  . Drug use: No     Allergies   Oxycodone   Review of Systems Review of Systems   Physical Exam Triage Vital Signs ED Triage Vitals  Enc Vitals Group     BP      Pulse      Resp      Temp      Temp src      SpO2      Weight      Height      Head Circumference      Peak Flow      Pain Score      Pain Loc      Pain Edu?      Excl. in [place]?    No data found.  Updated Vital Signs BP 115/81 (BP Location: Left Arm)   Pulse 93   Temp 98 F (36.7 C) (Oral)   Resp 18   LMP 11/15/2018   SpO2 98%    Physical Exam Vitals signs and nursing note reviewed.  Constitutional:      Appearance: Normal appearance.  HENT:     Head: Normocephalic and atraumatic.     Right Ear: Tympanic membrane and external ear normal.     Left Ear: Tympanic membrane and external ear normal.     Nose: Nose normal.     Mouth/Throat:     Mouth: Mucous membranes are  moist.     Pharynx: Oropharynx is clear.  Neck:     Musculoskeletal: Normal range of motion and neck supple.  Cardiovascular:     Rate and Rhythm: Normal rate and regular rhythm.     Heart sounds: Normal heart sounds.  Pulmonary:     Effort: Pulmonary effort is normal.     Breath sounds: Normal breath sounds.  Musculoskeletal: Normal range of motion.  Skin:    General: Skin is warm and dry.  Neurological:     General: No focal deficit present.     Mental Status: She is alert.  Psychiatric:        Mood and Affect: Mood normal.        Behavior: Behavior normal.      UC Treatments / Results  Labs (all labs ordered are listed, but only abnormal results are displayed) Labs Reviewed - No data to display  EKG None  Radiology No results found.  Procedures Procedures (including critical care time)  Medications Ordered in UC Medications - No data to display  Initial Impression / Assessment and Plan / UC Course  I have reviewed the triage vital signs and the nursing notes.  Pertinent labs & imaging results that were available during my care of the patient were reviewed by me and considered in my medical decision making (see chart for details).    Final Clinical Impressions(s) / UC Diagnoses   Final diagnoses:  Influenza-like illness   Discharge Instructions   None    ED Prescriptions    Medication Sig Dispense Auth. Provider   oseltamivir (TAMIFLU) 75 MG capsule Take 1 capsule (75 mg total) by mouth every 12 (twelve) hours. 10 capsule Robyn Haber, MD   benzonatate (TESSALON) 100 MG capsule Take 1-2 capsules (100-200 mg total) by mouth 3 (three) times daily as needed for cough. 40 capsule Robyn Haber, MD     Controlled Substance Prescriptions [place] Controlled Substance Registry consulted? Not Applicable   Robyn Haber, MD  11/29/18 1026  

## 2018-11-29 NOTE — ED Triage Notes (Signed)
Pt c/o dry cough and body aches since yesterday

## 2019-02-20 ENCOUNTER — Other Ambulatory Visit: Payer: Self-pay

## 2019-02-20 ENCOUNTER — Ambulatory Visit (INDEPENDENT_AMBULATORY_CARE_PROVIDER_SITE_OTHER): Payer: 59 | Admitting: Otolaryngology

## 2019-02-20 DIAGNOSIS — H93293 Other abnormal auditory perceptions, bilateral: Secondary | ICD-10-CM

## 2019-02-20 DIAGNOSIS — H6983 Other specified disorders of Eustachian tube, bilateral: Secondary | ICD-10-CM

## 2019-02-20 DIAGNOSIS — H9311 Tinnitus, right ear: Secondary | ICD-10-CM | POA: Diagnosis not present

## 2019-05-02 ENCOUNTER — Other Ambulatory Visit: Payer: Self-pay

## 2019-05-02 ENCOUNTER — Ambulatory Visit (INDEPENDENT_AMBULATORY_CARE_PROVIDER_SITE_OTHER): Payer: 59 | Admitting: Neurology

## 2019-05-02 ENCOUNTER — Encounter: Payer: Self-pay | Admitting: Neurology

## 2019-05-02 VITALS — BP 126/90 | HR 97 | Ht 64.0 in | Wt 142.0 lb

## 2019-05-02 DIAGNOSIS — R0683 Snoring: Secondary | ICD-10-CM | POA: Diagnosis not present

## 2019-05-02 DIAGNOSIS — H8190 Unspecified disorder of vestibular function, unspecified ear: Secondary | ICD-10-CM

## 2019-05-02 DIAGNOSIS — G478 Other sleep disorders: Secondary | ICD-10-CM

## 2019-05-02 DIAGNOSIS — H93A1 Pulsatile tinnitus, right ear: Secondary | ICD-10-CM | POA: Diagnosis not present

## 2019-05-02 DIAGNOSIS — H81399 Other peripheral vertigo, unspecified ear: Secondary | ICD-10-CM

## 2019-05-02 DIAGNOSIS — R42 Dizziness and giddiness: Secondary | ICD-10-CM

## 2019-05-02 NOTE — Patient Instructions (Addendum)
Unfortunately, dizziness is a very common complaint but is often not due to a primary neurological reason or single underlying medical problem. Often, there a combination of factors, that result in dizziness. This includes blood pressure fluctuations, medication side effects, blood sugar fluctuations, stress, vertigo, poor sleep with sleep deprivation, dehydration, and electrolyte disturbance or other metabolic and endocrinological reasons, meaning hormone related problems such as thyroid dysfunction. We will investigate things further with a brain MRI with and without contrast. We will call you with the test results. I will also order a sleep study and MRA head to look at your brain blood vessels. Your exam is normal, which is of course reassuring. You have a mild drop in your Systolic blood pressure when you change positions. Please stay well-hydrated and well rested, change positions slowly, I do not hear any telltale symptoms for vertiginous migraines.

## 2019-05-02 NOTE — Progress Notes (Signed)
Epworth Sleepiness Scale 0= would never doze 1= slight chance of dozing 2= moderate chance of dozing 3= high chance of dozing  Sitting and reading: 2 Watching TV: 1 Sitting inactive in a public place (ex. Theater or meeting): 1 As a passenger in a car for an hour without a break: 1 Lying down to rest in the afternoon: 2 Sitting and talking to someone: 1 Sitting quietly after lunch (no alcohol): 1 In a car, while stopped in traffic: 1 Total: 10  FSS: 31

## 2019-05-02 NOTE — Progress Notes (Signed)
Subjective:    Patient ID: Donna Mcdowell is a 38 y.o. female.  HPI     Star Age, MD, PhD [facility] [address], [address] [place], [place] [address]  Dear Dr. Benjamine Mola,  I saw your patient, Donna Mcdowell, upon your kind request in my neurologic clinic today for initial consultation of her dizziness.  The patient is unaccompanied today.  As you know, Donna Mcdowell is a 38 year old right-handed woman with an underlying benign medical history, status post septoplasty and inferior turbinate reduction in November 2019, who reports a new onset pulsatile tinnitus affecting her right ear for the past 2 or 3 months.  She has noted a thumping sound in her right ear, it is intermittent.  She has had intermittent dizziness for the past year, most severe in August 2019 when she drove to [facility] to attend a bachelor party.  She had severe spinning sensation at the time, felt bad the next day as well.  She denies any history of migraines or recurrent severe headaches.  She has occasional headaches especially if she is dehydrated and not well rested.  She does report snoring and prior to her nasal surgery she had more louder snoring and also the occasional breathing irregularity according to her wife.  She lives with her wife, they have no kids, she has 2 dogs and 1 cat in the household, works as a Industrial/product designer, she admits that she has a erratic sleep schedule.  Epworth sleepiness score is 10 out of 24, fatigue severity score is 31 out of 63.  She had testing for vertigo last year.  She drinks water regularly, tries to be good with her water intake, likes to drink 2 cups of coffee per day, sometimes more, no soda or tea, smokes 1 pack/day and drinks alcohol in the form of beer, an average of 2/day and quit drinking liquor about 4 years ago.  She has had intermittent increase in anxiety.  She denies any migrainous headaches, no one-sided severe throbbing headaches,  no associated nausea or vomiting, no photophobia or sonophobia.  She has not fallen. I reviewed your office note from 02/20/2019 she had hearing test done through your office which showed normal hearing bilaterally and upon your examination, her ear examination and hearing were normal.  She had a brain MRI without contrast on 06/05/2018 due to a 4-day history of dizziness reported which prompted her to go to the emergency room at [facility] and I reviewed the results:   IMPRESSION: Normal MRI the brain. No acute or focal lesion to explain the patient's symptoms.  Her Past Medical History Is Significant For: Past Medical History:  Diagnosis Date  . Carbuncle of face    forehead  . Deviated septum 07/2018  . Nasal mass 07/2018  . Nasal turbinate hypertrophy 07/2018   bilateral    Her Past Surgical History Is Significant For: Past Surgical History:  Procedure Laterality Date  . EXCISION NASAL MASS Left 08/30/2018   Procedure: EXCISION OF NASAL MASS;  Surgeon: Leta Baptist, MD;  Location: [place];  Service: ENT;  Laterality: Left;  . FINGER TENDON REPAIR Right 06/23/2010   long finger  . INCISION AND DRAINAGE BREAST ABSCESS Left 06/09/2011  . NASAL SEPTOPLASTY W/ TURBINOPLASTY Bilateral 08/30/2018   Procedure: NASAL SEPTOPLASTY WITH TURBINATE REDUCTION;  Surgeon: Leta Baptist, MD;  Location: [place];  Service: ENT;  Laterality: Bilateral;  . TRIGGER FINGER RELEASE Right  06/23/2010   long finger  . WISDOM TOOTH EXTRACTION      Her Family History Is Significant For: Family History  Problem Relation Age of Onset  . Breast cancer Mother 22  . Cancer Mother        Breast  . Osteoarthritis Maternal Grandmother   . Cancer Maternal Grandmother        Lung  . Osteoarthritis Maternal Grandfather   . Heart failure Maternal Grandfather     Her Social History Is Significant For: Social History   Socioeconomic History  . Marital status: Married     Spouse name: Not on file  . Number of children: Not on file  . Years of education: Not on file  . Highest education level: Not on file  Occupational History  . Not on file  Social Needs  . Financial resource strain: Not on file  . Food insecurity    Worry: Not on file    Inability: Not on file  . Transportation needs    Medical: Not on file    Non-medical: Not on file  Tobacco Use  . Smoking status: Current Every Day Smoker    Packs/day: 1.00    Years: 19.00    Pack years: 19.00    Types: Cigarettes  . Smokeless tobacco: Never Used  Substance and Sexual Activity  . Alcohol use: Yes    Comment: Drinks 2 drinks a day  . Drug use: No  . Sexual activity: Yes    Partners: Female    Comment: Lives with partner  Lifestyle  . Physical activity    Days per week: Not on file    Minutes per session: Not on file  . Stress: Not on file  Relationships  . Social Herbalist on phone: Not on file    Gets together: Not on file    Attends religious service: Not on file    Active member of club or organization: Not on file    Attends meetings of clubs or organizations: Not on file    Relationship status: Not on file  Other Topics Concern  . Not on file  Social History Narrative  . Not on file    Her Allergies Are:  Allergies  Allergen Reactions  . Oxycodone Nausea And Vomiting  :   Her Current Medications Are:  Outpatient Encounter Medications as of 05/02/2019  Medication Sig  . acetaminophen (TYLENOL) 500 MG tablet Take 1,000 mg by mouth every 6 (six) hours as needed.  . cetirizine (ZYRTEC) 10 MG tablet Take 10 mg by mouth daily as needed.   . Ibuprofen (ADVIL PO) Take by mouth as needed.  . [DISCONTINUED] benzonatate (TESSALON) 100 MG capsule Take 1-2 capsules (100-200 mg total) by mouth 3 (three) times daily as needed for cough.  . [DISCONTINUED] oseltamivir (TAMIFLU) 75 MG capsule Take 1 capsule (75 mg total) by mouth every 12 (twelve) hours.   No  facility-administered encounter medications on file as of 05/02/2019.   :   Review of Systems:  Out of a complete 14 point review of systems, all are reviewed and negative with the exception of these symptoms as listed below:  Review of Systems  Neurological:       Pt presents today to discuss her dizziness. Pt has dizzy spells a couple times a week. Pt was told that it may be related to migraines.    Objective:  Neurological Exam  Physical Exam Physical Examination:   Vitals:  05/02/19 1402  BP: 126/90  Pulse: 97   On orthostatic testing: Lying blood pressure and pulse 137/86 with a pulse of 86, sitting 126/90 with a pulse of 97, standing 124/84 with a pulse of 82.   General Examination: The patient is a very pleasant 38 y.o. female in no acute distress. She appears well-developed and well-nourished and well groomed.   HEENT: Normocephalic, atraumatic, pupils are equal, round and reactive to light and accommodation. Funduscopic exam is normal with sharp disc margins noted. Extraocular tracking is good without limitation to gaze excursion or nystagmus noted. Normal smooth pursuit is noted. Hearing is grossly intact. Tympanic membranes are clear bilaterally. Face is symmetric with normal facial animation and normal facial sensation. Speech is clear with no dysarthria noted. There is no hypophonia. There is no lip, neck/head, jaw or voice tremor. Neck is supple with full range of passive and active motion. There are no carotid bruits on auscultation. Oropharynx exam reveals: mild mouth dryness, good dental hygiene and mild airway crowding, due to Small airway entry, tonsils are small.  Mallampati is class I.  Neck circumference is slender.  Tongue protrudes centrally in palate elevates symmetrically. Chest: Clear to auscultation without wheezing, rhonchi or crackles noted.  Heart: S1+S2+0, regular and normal without murmurs, rubs or gallops noted.   Abdomen: Soft, non-tender and  non-distended with normal bowel sounds appreciated on auscultation.  Extremities: There is no pitting edema in the distal lower extremities bilaterally. Pedal pulses are intact.  Skin: Warm and dry without trophic changes noted.  Musculoskeletal: exam reveals no obvious joint deformities, tenderness or joint swelling or erythema.   Neurologically:  Mental status: The patient is awake, alert and oriented in all 4 spheres. Her immediate and remote memory, attention, language skills and fund of knowledge are appropriate. There is no evidence of aphasia, agnosia, apraxia or anomia. Speech is clear with normal prosody and enunciation. Thought process is linear. Mood is normal and affect is normal.  Cranial nerves II - XII are as described above under HEENT exam. In addition: shoulder shrug is normal with equal shoulder height noted. Motor exam: Normal bulk, strength and tone is noted. There is no drift, tremor or rebound. Romberg is negative. Reflexes are 2+ throughout. Babinski: Toes are flexor bilaterally. Fine motor skills and coordination: intact with normal finger taps, normal hand movements, normal rapid alternating patting, normal foot taps and normal foot agility.  Cerebellar testing: No dysmetria or intention tremor on finger to nose testing. Heel to shin is unremarkable bilaterally. There is no truncal or gait ataxia.  Sensory exam: intact to light touch, vibration, and temp in the upper and lower extremities.  Gait, station and balance: She stands easily. No veering to one side is noted. No leaning to one side is noted. Posture is age-appropriate and stance is narrow based. Gait shows normal stride length and normal pace. No problems turning are noted. Tandem walk is unremarkable.   Assessment and Plan:   In summary, Donna Mcdowell is a very pleasant 38 y.o.-year old female with an underlying benign medical history, status post septoplasty and inferior turbinate reduction in November 2019,  who Presents for evaluation of her dizziness And her pulsatile tinnitus.  She has had dizziness intermittently for the past year or so, most severe in August 2019, probably exacerbated by dehydration and overheating at the time as she recalls.  She also reports intermittent vertigo spells.  She has no obvious abnormality on neurological exam thankfully.  In fact,  she has a rather benign exam today.  She has a small drop in systolic blood pressure testing on orthostatic vital sign testing but not enough to explain all her symptoms I believe.  She may have more tendency towards dehydration on some days than others.  She has had some sleep difficulty.  Her history is not telltale for migraines.  Vertiginous migraines are probably not likely.  She is not in favor of taking any migraine prevention medication, she does not have any significant headaches, the most likely reason for her to have a headache is dehydration and Being hung over.  She is advised to stay well-hydrated with water and better rested.  We will proceed with sleep study testing because she reports nonrestorative sleep and a history of snoring.  We will also proceed with further imaging evaluation in the form of MRA head to rule out any vascular reason for the pulsatile tinnitus which started more recently in the past 3 months.  I will also complete her brain MRI evaluation with a contrasted scan.  This will help rule out any structural cause of her symptoms.  From the neurological standpoint, she does not need any new medication.  We talked about contributing factors that can cause dizziness. She has had hearing evaluation and also Additional testing with the rotational chair and positional and oculomotor assessment last year in September and I reviewed the results. We will reconvene after testing is completed, we will keep her posted as to her MRI brain and MRA results as well as sleep testing results.  If she has obstructive sleep apnea she will  benefit from treatment.  I answered all her questions today and the patient was in agreement with the above outlined plan.  Thank you very much for allowing me to participate in the care of this nice patient. If I can be of any further assistance to you please do not hesitate to call me at [phone].  Sincerely,   Star Age, MD, PhD

## 2019-05-03 ENCOUNTER — Telehealth: Payer: Self-pay | Admitting: Neurology

## 2019-05-03 NOTE — Telephone Encounter (Signed)
[employer] order sent to GI. They will obtain the auth and reach out to the patient to schedule.  

## 2019-05-30 ENCOUNTER — Other Ambulatory Visit: Payer: Self-pay | Admitting: Obstetrics and Gynecology

## 2019-05-30 DIAGNOSIS — Z1231 Encounter for screening mammogram for malignant neoplasm of breast: Secondary | ICD-10-CM

## 2019-05-31 ENCOUNTER — Ambulatory Visit (INDEPENDENT_AMBULATORY_CARE_PROVIDER_SITE_OTHER): Payer: 59 | Admitting: Neurology

## 2019-05-31 ENCOUNTER — Other Ambulatory Visit: Payer: Self-pay

## 2019-05-31 DIAGNOSIS — R0683 Snoring: Secondary | ICD-10-CM

## 2019-05-31 DIAGNOSIS — H81399 Other peripheral vertigo, unspecified ear: Secondary | ICD-10-CM

## 2019-05-31 DIAGNOSIS — H93A1 Pulsatile tinnitus, right ear: Secondary | ICD-10-CM

## 2019-05-31 DIAGNOSIS — G4733 Obstructive sleep apnea (adult) (pediatric): Secondary | ICD-10-CM | POA: Diagnosis not present

## 2019-05-31 DIAGNOSIS — R42 Dizziness and giddiness: Secondary | ICD-10-CM

## 2019-05-31 DIAGNOSIS — G478 Other sleep disorders: Secondary | ICD-10-CM

## 2019-05-31 DIAGNOSIS — H8190 Unspecified disorder of vestibular function, unspecified ear: Secondary | ICD-10-CM

## 2019-06-06 ENCOUNTER — Ambulatory Visit
Admission: RE | Admit: 2019-06-06 | Discharge: 2019-06-06 | Disposition: A | Discharge status: Home / Self Care | Payer: 59 | Source: Ambulatory Visit | Attending: Neurology | Admitting: Neurology

## 2019-06-06 ENCOUNTER — Other Ambulatory Visit: Payer: Self-pay

## 2019-06-06 DIAGNOSIS — H81399 Other peripheral vertigo, unspecified ear: Secondary | ICD-10-CM

## 2019-06-06 MED ORDER — GADOBENATE DIMEGLUMINE 529 MG/ML IV SOLN
12.0000 mL | Freq: Once | INTRAVENOUS | Status: AC | PRN
  Administered 2019-06-06: 12 mL via INTRAVENOUS

## 2019-06-07 ENCOUNTER — Telehealth: Payer: Self-pay

## 2019-06-07 NOTE — Progress Notes (Signed)
Patient referred by Dr. Benjamine Mola for dizziness and tinnitus, seen by me on 05/02/19, diagnostic PSG on 05/31/19.   Please call and notify the patient that the recent sleep study did not show any significant obstructive sleep apnea with the exception of mild to moderate snoring and supine REM sleep related OSA; for this, treatment with positive airway pressure is not warranted. Avoidance of the supine sleep position will likely help. For disturbing snoring, an oral appliance (through a qualified dentist) can be considered. She is encouraged to talk to her dentist about it.  She had her MRA and MRI yesterday; we will call with results soon.   Star Age, MD, PhD [facility])

## 2019-06-07 NOTE — Procedures (Signed)
PATIENT'S NAME:  Donna Mcdowell, Donna Mcdowell DOB:      [birth year]      MR#:    [id number]     DATE OF RECORDING: 05/31/2019 REFERRING M.D.:  Dr. Benjamine Mola  Study Performed:   Baseline Polysomnogram HISTORY: 38 year old woman with a history of status post septoplasty and inferior turbinate reduction in November 2019, who reports a new onset pulsatile tinnitus affecting her right ear for the past 2 or 3 months and intermittent dizziness for the past year. She reports snoring. The patient endorsed the Epworth Sleepiness Scale at 10 points. The patient's weight 142 pounds with a height of 64 (inches), resulting in a BMI of 24.1 kg/m2.   CURRENT MEDICATIONS: Tylenol, Zyrtec, Advil   PROCEDURE:  This is a multichannel digital polysomnogram utilizing the Somnostar 11.2 system.  Electrodes and sensors were applied and monitored per AASM Specifications.   EEG, EOG, Chin and Limb EMG, were sampled at 200 Hz.  ECG, Snore and Nasal Pressure, Thermal Airflow, Respiratory Effort, CPAP Flow and Pressure, Oximetry was sampled at 50 Hz. Digital video and audio were recorded.      BASELINE STUDY  Lights Out was at 21:42 and Lights On at 05:00.  Total recording time (TRT) was 438.5 minutes, with a total sleep time (TST) of 347.5 minutes.   The patient's sleep latency was 62.5 minutes, which is delayed. REM latency was 121 minutes, which is high normal. The sleep efficiency was 79.2%.     SLEEP ARCHITECTURE: WASO (Wake after sleep onset) was 27.5 minutes with mild sleep fragmentation noted. There were 21.5 minutes in Stage N1, 159.5 minutes Stage N2, 102.5 minutes Stage N3 and 64 minutes in Stage REM.  The percentage of Stage N1 was 6.2%, Stage N2 was 45.9%, which is normal, Stage N3 was 29.5% and Stage R (REM sleep) was 18.4%, which is near-normal.  RESPIRATORY ANALYSIS:  There were a total of 17 respiratory events:  0 obstructive apneas, 0 central apneas and 0 mixed apneas with a total of 0 apneas and an apnea index (AI) of 0 /hour.  There were 17 hypopneas with a hypopnea index of 2.9 /hour. The patient also had 0 respiratory event related arousals (RERAs).      The total APNEA/HYPOPNEA INDEX (AHI) was 2.9 /hour and the total RESPIRATORY DISTURBANCE INDEX was 0. 2.9 /hour.  17 events occurred in REM sleep and 0 events in NREM. The REM AHI was 15.9 /hour, versus a non-REM AHI of 0. The patient spent 241 minutes of total sleep time in the supine position and 107 minutes in non-supine.. The supine AHI was 4.2 versus a non-supine AHI of 0.0.  OXYGEN SATURATION & C02:  The Wake baseline 02 saturation was 96%, with the lowest being 85%. Time spent below 89% saturation equaled 5 minutes.  PERIODIC LIMB MOVEMENTS:   The patient had a total of 0 Periodic Limb Movements.  The Periodic Limb Movement (PLM) index was 0 and the PLM Arousal index was 0/hour. The arousals were noted as: 41 were spontaneous, 0 were associated with PLMs, 2 were associated with respiratory events.  Audio and video analysis did not show any abnormal or unusual movements, behaviors, phonations or vocalizations. The patient took 1 bathroom break. Mild to moderate snoring was noted. The EKG was in keeping with normal sinus rhythm (NSR).   Post-study, the patient indicated that sleep was the same as usual.   IMPRESSION:  1. Primary Snoring  RECOMMENDATIONS:  1. This study does not demonstrate any significant obstructive  or central sleep disordered breathing with the exception of mild to moderate snoring and supine REM sleep related OSA; for this, treatment with positive airway pressure is not warranted. Avoidance of the supine sleep position will likely help. For disturbing snoring, an oral appliance (through a qualified dentist) can be considered.  2. This study does not support an intrinsic sleep disorder as a cause of the patient's symptoms. Other causes, including circadian rhythm disturbances, an underlying mood disorder, medication effect and/or an underlying  medical problem cannot be ruled out. 3. The patient should be cautioned not to drive, work at heights, or operate dangerous or heavy equipment when tired or sleepy. Review and reiteration of good sleep hygiene measures should be pursued with any patient. 4. The patient and her referring physician will be notified of the test results.  I certify that I have reviewed the entire raw data recording prior to the issuance of this report in accordance with the Standards of Accreditation of the American Academy of Sleep Medicine (AASM)   Star Age, MD, PhD Diplomat, American Board of Neurology and Sleep Medicine (Neurology and Sleep Medicine)

## 2019-06-07 NOTE — Telephone Encounter (Signed)
-----   Message from Star Age, MD sent at 06/07/2019  8:23 AM EDT ----- Patient referred by Dr. Benjamine Mola for dizziness and tinnitus, seen by me on 05/02/19, diagnostic PSG on 05/31/19.   Please call and notify the patient that the recent sleep study did not show any significant obstructive sleep apnea with the exception of mild to moderate snoring and supine REM sleep related OSA; for this, treatment with positive airway pressure is not warranted. Avoidance of the supine sleep position will likely help. For disturbing snoring, an oral appliance (through a qualified dentist) can be considered. She is encouraged to talk to her dentist about it.  She had her MRA and MRI yesterday; we will call with results soon.   Star Age, MD, PhD [facility])

## 2019-06-07 NOTE — Telephone Encounter (Signed)
I called pt and discussed her sleep study results and recommendations. Pt verbalized understanding of results. Pt had no questions at this time but was encouraged to call back if questions arise.  

## 2019-06-08 ENCOUNTER — Telehealth: Payer: Self-pay

## 2019-06-08 NOTE — Telephone Encounter (Signed)
I am afraid, there is really nothing else I can think of. She is encouraged to talk to her primary care about her symptoms; she may benefit from seeing her ENT again, consider physical therapy for vestibular rehab, if he recommends

## 2019-06-08 NOTE — Progress Notes (Signed)
Please advise patient that her brain MRI and blood vessel imaging with MRA were reported as normal, which is of course reassuring. She can FU as scheduled. Michel Bickers

## 2019-06-08 NOTE — Telephone Encounter (Signed)
-----   Message from Star Age, MD sent at 06/08/2019 12:41 PM EDT ----- Please advise patient that her brain MRI and blood vessel imaging with MRA were reported as normal, which is of course reassuring. She can FU as scheduled. Michel Bickers

## 2019-06-08 NOTE — Telephone Encounter (Signed)
I called pt and discussed this with her. She has an ENT appt coming up. She will discuss this with them as well. Pt verbalized understanding of recommendations.

## 2019-06-08 NOTE — Telephone Encounter (Signed)
I called pt and discussed her MRI/MRA results. Pt asked what she should be doing for her symptoms before her appt. I offered her a sooner appt but she declined. She wants to know if there is any other doctors she should see, such as ophthalmology? She had her eyes checked within the past year. I advised her that she should say well hydrated and rested. Pt still wants further recommendations from Dr. Rexene Mcdowell.

## 2019-06-26 ENCOUNTER — Ambulatory Visit (INDEPENDENT_AMBULATORY_CARE_PROVIDER_SITE_OTHER): Payer: 59 | Admitting: Otolaryngology

## 2019-06-26 DIAGNOSIS — J310 Chronic rhinitis: Secondary | ICD-10-CM

## 2019-07-12 ENCOUNTER — Ambulatory Visit: Payer: 59

## 2019-07-19 ENCOUNTER — Ambulatory Visit
Admission: RE | Admit: 2019-07-19 | Discharge: 2019-07-19 | Disposition: A | Discharge status: Home / Self Care | Payer: 59 | Source: Ambulatory Visit | Attending: Obstetrics and Gynecology | Admitting: Obstetrics and Gynecology

## 2019-07-19 ENCOUNTER — Other Ambulatory Visit: Payer: Self-pay

## 2019-07-19 DIAGNOSIS — Z1231 Encounter for screening mammogram for malignant neoplasm of breast: Secondary | ICD-10-CM

## 2019-08-02 ENCOUNTER — Ambulatory Visit: Payer: 59 | Admitting: Neurology

## 2019-10-11 NOTE — Progress Notes (Signed)
38 y.o. G0P0 Married White or Caucasian Not Hispanic or Latino female here for annual exam.   Menses q month x 5 days. Saturates a super tampon in up to 2 hours. No BTB. Occasional cramps.     Mom with breast cancer at 8. Mom never had genetic testing.   Patient's last menstrual period was 09/16/2019 (approximate).          Sexually active: Yes.   Female partner The current method of family planning is none.    Exercising: Yes.    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 She has a 19.00 pack-year smoking history. She has never used smokeless tobacco. She reports current alcohol use. She reports that she does not use drugs. 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 Wife is a Corporate treasurer  Past Medical History:  Diagnosis Date  . Carbuncle of face    forehead  . Deviated septum 07/2018  . Nasal mass 07/2018  . Nasal turbinate hypertrophy 07/2018   bilateral    Past Surgical History:  Procedure Laterality Date  . EXCISION NASAL MASS Left 08/30/2018   Procedure: EXCISION OF NASAL MASS;  Surgeon: Leta Baptist, MD;  Location: [place];  Service: ENT;  Laterality: Left;  . FINGER TENDON REPAIR Right 06/23/2010   long finger  . INCISION AND DRAINAGE BREAST ABSCESS Left 06/09/2011  . NASAL SEPTOPLASTY W/ TURBINOPLASTY Bilateral 08/30/2018   Procedure: NASAL SEPTOPLASTY WITH TURBINATE REDUCTION;  Surgeon: Leta Baptist, MD;  Location: [place];  Service: ENT;  Laterality: Bilateral;  . TRIGGER FINGER RELEASE Right 06/23/2010   long finger  . WISDOM TOOTH EXTRACTION      Current Outpatient Medications  Medication Sig Dispense Refill  . acetaminophen (TYLENOL) 500 MG tablet Take 1,000 mg by mouth every 6  (six) hours as needed.    . Ibuprofen (ADVIL PO) Take by mouth as needed.     No current facility-administered medications for this visit.    Family History  Problem Relation Age of Onset  . Breast cancer Mother 71  . Cancer Mother        Breast  . Osteoarthritis Maternal Grandmother   . Cancer Maternal Grandmother        Lung  . Osteoarthritis Maternal Grandfather   . Heart failure Maternal Grandfather     Review of Systems  Constitutional: Negative.   HENT: Negative.   Eyes: Negative.   Respiratory: Negative.   Cardiovascular: Negative.   Gastrointestinal: Negative.   Endocrine: Negative.   Genitourinary: Negative.   Musculoskeletal: Negative.   Skin: Negative.   Allergic/Immunologic: Negative.   Neurological: Negative.   Hematological: Negative.   Psychiatric/Behavioral: Negative.   All other systems reviewed and are negative.   Exam:   BP 110/62   Pulse 81   Temp 98.8 F (37.1 C)   Ht 5' 3.75" (1.619 m)   Wt 146 lb (66.2 kg)   LMP 09/16/2019 (Approximate)   SpO2 96%   BMI 25.26 kg/m   Weight change: @WEIGHTCHANGE @ Height:   Height: 5' 3.75" (161.9 cm)  Ht Readings from Last 3 Encounters:  10/16/19 5' 3.75" (1.619 m)  05/02/19 5\' 4"  (1.626 m)  09/23/18 5'  4" (1.626 m)    General appearance: alert, cooperative and appears stated age Head: Normocephalic, without obvious abnormality, atraumatic Neck: no adenopathy, supple, symmetrical, trachea midline and thyroid normal to inspection and palpation Lungs: clear to auscultation bilaterally Cardiovascular: regular rate and rhythm Breasts: normal appearance, no masses or tenderness Abdomen: soft, non-tender; non distended,  no masses,  no organomegaly Extremities: extremities normal, atraumatic, no cyanosis or edema Skin: Skin color, texture, turgor normal. No rashes or lesions Lymph nodes: Cervical, supraclavicular, and axillary nodes normal. No abnormal inguinal nodes palpated Neurologic: Grossly  normal   Pelvic: External genitalia:  no lesions              Urethra:  normal appearing urethra with no masses, tenderness or lesions              Bartholins and Skenes: normal                 Vagina: normal appearing vagina with normal color and discharge, no lesions              Cervix: no lesions               Bimanual Exam:  Uterus:  normal size, contour, position, consistency, mobility, non-tender              Adnexa: no mass, fullness, tenderness               Rectovaginal: declined  Gae Dry chaperoned for the exam.  A:  Well Woman with normal exam  FH of breast cancer in her mom at 62 (no testing)  H/O CIN III in 2014, normal since  P:   Pap next year  Continue with yearly mammogram (discussed her breast density, D)  Discussed breast self exam  Discussed calcium and vit D intake  Referral to [facility]

## 2019-10-16 ENCOUNTER — Other Ambulatory Visit: Payer: Self-pay

## 2019-10-16 ENCOUNTER — Ambulatory Visit (INDEPENDENT_AMBULATORY_CARE_PROVIDER_SITE_OTHER): Payer: 59 | Admitting: Obstetrics and Gynecology

## 2019-10-16 ENCOUNTER — Encounter: Payer: Self-pay | Admitting: Obstetrics and Gynecology

## 2019-10-16 VITALS — BP 110/62 | HR 81 | Temp 98.8°F | Ht 63.75 in | Wt 146.0 lb

## 2019-10-16 DIAGNOSIS — Z803 Family history of malignant neoplasm of breast: Secondary | ICD-10-CM | POA: Diagnosis not present

## 2019-10-16 DIAGNOSIS — R42 Dizziness and giddiness: Secondary | ICD-10-CM | POA: Insufficient documentation

## 2019-10-16 DIAGNOSIS — Z01419 Encounter for gynecological examination (general) (routine) without abnormal findings: Secondary | ICD-10-CM | POA: Diagnosis not present

## 2019-10-16 DIAGNOSIS — Z0000 Encounter for general adult medical examination without abnormal findings: Secondary | ICD-10-CM | POA: Diagnosis not present

## 2019-10-16 DIAGNOSIS — H938X1 Other specified disorders of right ear: Secondary | ICD-10-CM | POA: Insufficient documentation

## 2019-10-16 NOTE — Patient Instructions (Signed)
EXERCISE AND DIET:  We recommended that you start or continue a regular exercise program for good health. Regular exercise means any activity that makes your heart beat faster and makes you sweat.  We recommend exercising at least 30 minutes per day at least 3 days a week, preferably 4 or 5.  We also recommend a diet low in fat and sugar.  Inactivity, poor dietary choices and obesity can cause diabetes, heart attack, stroke, and kidney damage, among others.    ALCOHOL AND SMOKING:  Women should limit their alcohol intake to no more than 7 drinks/beers/glasses of wine (combined, not each!) per week. Moderation of alcohol intake to this level decreases your risk of breast cancer and liver damage. And of course, no recreational drugs are part of a healthy lifestyle.  And absolutely no smoking or even second hand smoke. Most people know smoking can cause heart and lung diseases, but did you know it also contributes to weakening of your bones? Aging of your skin?  Yellowing of your teeth and nails?  CALCIUM AND VITAMIN D:  Adequate intake of calcium and Vitamin D are recommended.  The recommendations for exact amounts of these supplements seem to change often, but generally speaking 1,000 mg of calcium (between diet and supplement) and 800 units of Vitamin D per day seems prudent. Certain women may benefit from higher intake of Vitamin D.  If you are among these women, your doctor will have told you during your visit.    PAP SMEARS:  Pap smears, to check for cervical cancer or precancers,  have traditionally been done yearly, although recent scientific advances have shown that most women can have pap smears less often.  However, every woman still should have a physical exam from her gynecologist every year. It will include a breast check, inspection of the vulva and vagina to check for abnormal growths or skin changes, a visual exam of the cervix, and then an exam to evaluate the size and shape of the uterus and  ovaries.  And after 37 years of age, a rectal exam is indicated to check for rectal cancers. We will also provide age appropriate advice regarding health maintenance, like when you should have certain vaccines, screening for sexually transmitted diseases, bone density testing, colonoscopy, mammograms, etc.   MAMMOGRAMS:  All women over 40 years old should have a yearly mammogram. Many facilities now offer a "3D" mammogram, which may cost around $50 extra out of pocket. If possible,  we recommend you accept the option to have the 3D mammogram performed.  It both reduces the number of women who will be called back for extra views which then turn out to be normal, and it is better than the routine mammogram at detecting truly abnormal areas.    COLON CANCER SCREENING: Now recommend starting at age 45. At this time colonoscopy is not covered for routine screening until 50. There are take home tests that can be done between 45-49.   COLONOSCOPY:  Colonoscopy to screen for colon cancer is recommended for all women at age 50.  We know, you hate the idea of the prep.  We agree, BUT, having colon cancer and not knowing it is worse!!  Colon cancer so often starts as a polyp that can be seen and removed at colonscopy, which can quite literally save your life!  And if your first colonoscopy is normal and you have no family history of colon cancer, most women don't have to have it again for   10 years.  Once every ten years, you can do something that may end up saving your life, right?  We will be happy to help you get it scheduled when you are ready.  Be sure to check your insurance coverage so you understand how much it will cost.  It may be covered as a preventative service at no cost, but you should check your particular policy.      Breast Self-Awareness Breast self-awareness means being familiar with how your breasts look and feel. It involves checking your breasts regularly and reporting any changes to your  health care provider. Practicing breast self-awareness is important. A change in your breasts can be a sign of a serious medical problem. Being familiar with how your breasts look and feel allows you to find any problems early, when treatment is more likely to be successful. All women should practice breast self-awareness, including women who have had breast implants. How to do a breast self-exam One way to learn what is normal for your breasts and whether your breasts are changing is to do a breast self-exam. To do a breast self-exam: Look for Changes  1. Remove all the clothing above your waist. 2. Stand in front of a mirror in a room with good lighting. 3. Put your hands on your hips. 4. Push your hands firmly downward. 5. Compare your breasts in the mirror. Look for differences between them (asymmetry), such as: ? Differences in shape. ? Differences in size. ? Puckers, dips, and bumps in one breast and not the other. 6. Look at each breast for changes in your skin, such as: ? Redness. ? Scaly areas. 7. Look for changes in your nipples, such as: ? Discharge. ? Bleeding. ? Dimpling. ? Redness. ? A change in position. Feel for Changes Carefully feel your breasts for lumps and changes. It is best to do this while lying on your back on the floor and again while sitting or standing in the shower or tub with soapy water on your skin. Feel each breast in the following way:  Place the arm on the side of the breast you are examining above your head.  Feel your breast with the other hand.  Start in the nipple area and make  inch (2 cm) overlapping circles to feel your breast. Use the pads of your three middle fingers to do this. Apply light pressure, then medium pressure, then firm pressure. The light pressure will allow you to feel the tissue closest to the skin. The medium pressure will allow you to feel the tissue that is a little deeper. The firm pressure will allow you to feel the tissue  close to the ribs.  Continue the overlapping circles, moving downward over the breast until you feel your ribs below your breast.  Move one finger-width toward the center of the body. Continue to use the  inch (2 cm) overlapping circles to feel your breast as you move slowly up toward your collarbone.  Continue the up and down exam using all three pressures until you reach your armpit.  Write Down What You Find  Write down what is normal for each breast and any changes that you find. Keep a written record with breast changes or normal findings for each breast. By writing this information down, you do not need to depend only on memory for size, tenderness, or location. Write down where you are in your menstrual cycle, if you are still menstruating. If you are having trouble noticing differences   in your breasts, do not get discouraged. With time you will become more familiar with the variations in your breasts and more comfortable with the exam. How often should I examine my breasts? Examine your breasts every month. If you are breastfeeding, the best time to examine your breasts is after a feeding or after using a breast pump. If you menstruate, the best time to examine your breasts is 5-7 days after your period is over. During your period, your breasts are lumpier, and it may be more difficult to notice changes. When should I see my health care provider? See your health care provider if you notice:  A change in shape or size of your breasts or nipples.  A change in the skin of your breast or nipples, such as a reddened or scaly area.  Unusual discharge from your nipples.  A lump or thick area that was not there before.  Pain in your breasts.  Anything that concerns you.  

## 2019-10-17 ENCOUNTER — Other Ambulatory Visit: Payer: Self-pay | Admitting: Obstetrics and Gynecology

## 2019-10-17 DIAGNOSIS — R748 Abnormal levels of other serum enzymes: Secondary | ICD-10-CM

## 2019-10-17 LAB — COMPREHENSIVE METABOLIC PANEL
ALT: 42 IU/L — ABNORMAL HIGH (ref 0–32)
AST: 29 IU/L (ref 0–40)
Albumin/Globulin Ratio: 2.5 — ABNORMAL HIGH (ref 1.2–2.2)
Albumin: 4.7 g/dL (ref 3.8–4.8)
Alkaline Phosphatase: 65 IU/L (ref 39–117)
BUN/Creatinine Ratio: 13 (ref 9–23)
BUN: 7 mg/dL (ref 6–20)
Bilirubin Total: 0.3 mg/dL (ref 0.0–1.2)
CO2: 22 mmol/L (ref 20–29)
Calcium: 9.3 mg/dL (ref 8.7–10.2)
Chloride: 103 mmol/L (ref 96–106)
Creatinine, Ser: 0.53 mg/dL — ABNORMAL LOW (ref 0.57–1.00)
GFR calc Af Amer: 141 mL/min/{1.73_m2} (ref >59)
GFR calc non Af Amer: 123 mL/min/{1.73_m2} (ref >59)
Globulin, Total: 1.9 g/dL (ref 1.5–4.5)
Glucose: 68 mg/dL (ref 65–99)
Potassium: 4.3 mmol/L (ref 3.5–5.2)
Sodium: 140 mmol/L (ref 134–144)
Total Protein: 6.6 g/dL (ref 6.0–8.5)

## 2019-10-17 LAB — LIPID PANEL
Chol/HDL Ratio: 4.5 ratio — ABNORMAL HIGH (ref 0.0–4.4)
Cholesterol, Total: 195 mg/dL (ref 100–199)
HDL: 43 mg/dL (ref >39)
LDL Chol Calc (NIH): 124 mg/dL — ABNORMAL HIGH (ref 0–99)
Triglycerides: 160 mg/dL — ABNORMAL HIGH (ref 0–149)
VLDL Cholesterol Cal: 28 mg/dL (ref 5–40)

## 2019-10-17 LAB — CBC
Hematocrit: 41.7 % (ref 34.0–46.6)
Hemoglobin: 14.1 g/dL (ref 11.1–15.9)
MCH: 33.1 pg — ABNORMAL HIGH (ref 26.6–33.0)
MCHC: 33.8 g/dL (ref 31.5–35.7)
MCV: 98 fL — ABNORMAL HIGH (ref 79–97)
Platelets: 186 10*3/uL (ref 150–450)
RBC: 4.26 x10E6/uL (ref 3.77–5.28)
RDW: 12 % (ref 11.7–15.4)
WBC: 8.4 10*3/uL (ref 3.4–10.8)

## 2019-10-19 ENCOUNTER — Telehealth: Payer: Self-pay | Admitting: Genetic Counselor

## 2019-10-19 NOTE — Telephone Encounter (Signed)
Received a genetic counseling referral from Dr. Talbert Nan for fhx of brca. Donna Mcdowell has been cld and scheduled for a mychart video visit on 1/12 at 1pm. I verified the pt's phone number and email address. She's been made aware to login 10 minutes prior to appt.

## 2019-10-24 ENCOUNTER — Inpatient Hospital Stay: Payer: 59 | Attending: Genetic Counselor | Admitting: Genetic Counselor

## 2019-12-26 DIAGNOSIS — J343 Hypertrophy of nasal turbinates: Secondary | ICD-10-CM | POA: Diagnosis not present

## 2019-12-26 DIAGNOSIS — J310 Chronic rhinitis: Secondary | ICD-10-CM | POA: Diagnosis not present

## 2019-12-26 DIAGNOSIS — J330 Polyp of nasal cavity: Secondary | ICD-10-CM | POA: Diagnosis not present

## 2020-01-08 DIAGNOSIS — Z113 Encounter for screening for infections with a predominantly sexual mode of transmission: Secondary | ICD-10-CM | POA: Diagnosis not present

## 2020-01-19 DIAGNOSIS — Z3183 Encounter for assisted reproductive fertility procedure cycle: Secondary | ICD-10-CM | POA: Diagnosis not present

## 2020-01-19 DIAGNOSIS — N978 Female infertility of other origin: Secondary | ICD-10-CM | POA: Diagnosis not present

## 2020-01-19 DIAGNOSIS — Z113 Encounter for screening for infections with a predominantly sexual mode of transmission: Secondary | ICD-10-CM | POA: Diagnosis not present

## 2020-01-19 DIAGNOSIS — E288 Other ovarian dysfunction: Secondary | ICD-10-CM | POA: Diagnosis not present

## 2020-01-23 DIAGNOSIS — Z3183 Encounter for assisted reproductive fertility procedure cycle: Secondary | ICD-10-CM | POA: Diagnosis not present

## 2020-01-23 DIAGNOSIS — M9902 Segmental and somatic dysfunction of thoracic region: Secondary | ICD-10-CM | POA: Diagnosis not present

## 2020-01-23 DIAGNOSIS — N978 Female infertility of other origin: Secondary | ICD-10-CM | POA: Diagnosis not present

## 2020-01-23 DIAGNOSIS — M9903 Segmental and somatic dysfunction of lumbar region: Secondary | ICD-10-CM | POA: Diagnosis not present

## 2020-01-23 DIAGNOSIS — M9901 Segmental and somatic dysfunction of cervical region: Secondary | ICD-10-CM | POA: Diagnosis not present

## 2020-01-23 DIAGNOSIS — M9905 Segmental and somatic dysfunction of pelvic region: Secondary | ICD-10-CM | POA: Diagnosis not present

## 2020-01-23 DIAGNOSIS — E288 Other ovarian dysfunction: Secondary | ICD-10-CM | POA: Diagnosis not present

## 2020-01-23 DIAGNOSIS — Z113 Encounter for screening for infections with a predominantly sexual mode of transmission: Secondary | ICD-10-CM | POA: Diagnosis not present

## 2020-01-25 DIAGNOSIS — M9902 Segmental and somatic dysfunction of thoracic region: Secondary | ICD-10-CM | POA: Diagnosis not present

## 2020-01-25 DIAGNOSIS — M9903 Segmental and somatic dysfunction of lumbar region: Secondary | ICD-10-CM | POA: Diagnosis not present

## 2020-01-25 DIAGNOSIS — M9901 Segmental and somatic dysfunction of cervical region: Secondary | ICD-10-CM | POA: Diagnosis not present

## 2020-01-25 DIAGNOSIS — M9905 Segmental and somatic dysfunction of pelvic region: Secondary | ICD-10-CM | POA: Diagnosis not present

## 2020-01-29 DIAGNOSIS — M9901 Segmental and somatic dysfunction of cervical region: Secondary | ICD-10-CM | POA: Diagnosis not present

## 2020-01-29 DIAGNOSIS — M9905 Segmental and somatic dysfunction of pelvic region: Secondary | ICD-10-CM | POA: Diagnosis not present

## 2020-01-29 DIAGNOSIS — M9902 Segmental and somatic dysfunction of thoracic region: Secondary | ICD-10-CM | POA: Diagnosis not present

## 2020-01-29 DIAGNOSIS — M9903 Segmental and somatic dysfunction of lumbar region: Secondary | ICD-10-CM | POA: Diagnosis not present

## 2020-02-01 DIAGNOSIS — N856 Intrauterine synechiae: Secondary | ICD-10-CM | POA: Diagnosis not present

## 2020-02-06 DIAGNOSIS — M9901 Segmental and somatic dysfunction of cervical region: Secondary | ICD-10-CM | POA: Diagnosis not present

## 2020-02-06 DIAGNOSIS — M9902 Segmental and somatic dysfunction of thoracic region: Secondary | ICD-10-CM | POA: Diagnosis not present

## 2020-02-06 DIAGNOSIS — M9903 Segmental and somatic dysfunction of lumbar region: Secondary | ICD-10-CM | POA: Diagnosis not present

## 2020-02-06 DIAGNOSIS — M9905 Segmental and somatic dysfunction of pelvic region: Secondary | ICD-10-CM | POA: Diagnosis not present

## 2020-02-09 DIAGNOSIS — M9902 Segmental and somatic dysfunction of thoracic region: Secondary | ICD-10-CM | POA: Diagnosis not present

## 2020-02-09 DIAGNOSIS — M9903 Segmental and somatic dysfunction of lumbar region: Secondary | ICD-10-CM | POA: Diagnosis not present

## 2020-02-09 DIAGNOSIS — M9905 Segmental and somatic dysfunction of pelvic region: Secondary | ICD-10-CM | POA: Diagnosis not present

## 2020-02-09 DIAGNOSIS — M9901 Segmental and somatic dysfunction of cervical region: Secondary | ICD-10-CM | POA: Diagnosis not present

## 2020-02-12 DIAGNOSIS — M9905 Segmental and somatic dysfunction of pelvic region: Secondary | ICD-10-CM | POA: Diagnosis not present

## 2020-02-12 DIAGNOSIS — M9903 Segmental and somatic dysfunction of lumbar region: Secondary | ICD-10-CM | POA: Diagnosis not present

## 2020-02-12 DIAGNOSIS — M9901 Segmental and somatic dysfunction of cervical region: Secondary | ICD-10-CM | POA: Diagnosis not present

## 2020-02-12 DIAGNOSIS — M9902 Segmental and somatic dysfunction of thoracic region: Secondary | ICD-10-CM | POA: Diagnosis not present

## 2020-02-20 DIAGNOSIS — M9905 Segmental and somatic dysfunction of pelvic region: Secondary | ICD-10-CM | POA: Diagnosis not present

## 2020-02-20 DIAGNOSIS — M9901 Segmental and somatic dysfunction of cervical region: Secondary | ICD-10-CM | POA: Diagnosis not present

## 2020-02-20 DIAGNOSIS — M9903 Segmental and somatic dysfunction of lumbar region: Secondary | ICD-10-CM | POA: Diagnosis not present

## 2020-02-20 DIAGNOSIS — M9902 Segmental and somatic dysfunction of thoracic region: Secondary | ICD-10-CM | POA: Diagnosis not present

## 2020-03-05 DIAGNOSIS — M9902 Segmental and somatic dysfunction of thoracic region: Secondary | ICD-10-CM | POA: Diagnosis not present

## 2020-03-05 DIAGNOSIS — M9903 Segmental and somatic dysfunction of lumbar region: Secondary | ICD-10-CM | POA: Diagnosis not present

## 2020-03-05 DIAGNOSIS — M9905 Segmental and somatic dysfunction of pelvic region: Secondary | ICD-10-CM | POA: Diagnosis not present

## 2020-03-05 DIAGNOSIS — M9901 Segmental and somatic dysfunction of cervical region: Secondary | ICD-10-CM | POA: Diagnosis not present

## 2020-03-07 DIAGNOSIS — M9905 Segmental and somatic dysfunction of pelvic region: Secondary | ICD-10-CM | POA: Diagnosis not present

## 2020-03-07 DIAGNOSIS — M9902 Segmental and somatic dysfunction of thoracic region: Secondary | ICD-10-CM | POA: Diagnosis not present

## 2020-03-07 DIAGNOSIS — M9901 Segmental and somatic dysfunction of cervical region: Secondary | ICD-10-CM | POA: Diagnosis not present

## 2020-03-07 DIAGNOSIS — M9903 Segmental and somatic dysfunction of lumbar region: Secondary | ICD-10-CM | POA: Diagnosis not present

## 2020-03-13 DIAGNOSIS — M9905 Segmental and somatic dysfunction of pelvic region: Secondary | ICD-10-CM | POA: Diagnosis not present

## 2020-03-13 DIAGNOSIS — M9902 Segmental and somatic dysfunction of thoracic region: Secondary | ICD-10-CM | POA: Diagnosis not present

## 2020-03-13 DIAGNOSIS — M9901 Segmental and somatic dysfunction of cervical region: Secondary | ICD-10-CM | POA: Diagnosis not present

## 2020-03-13 DIAGNOSIS — M9903 Segmental and somatic dysfunction of lumbar region: Secondary | ICD-10-CM | POA: Diagnosis not present

## 2020-03-27 DIAGNOSIS — M9902 Segmental and somatic dysfunction of thoracic region: Secondary | ICD-10-CM | POA: Diagnosis not present

## 2020-03-27 DIAGNOSIS — M9901 Segmental and somatic dysfunction of cervical region: Secondary | ICD-10-CM | POA: Diagnosis not present

## 2020-03-27 DIAGNOSIS — M9903 Segmental and somatic dysfunction of lumbar region: Secondary | ICD-10-CM | POA: Diagnosis not present

## 2020-03-27 DIAGNOSIS — M9905 Segmental and somatic dysfunction of pelvic region: Secondary | ICD-10-CM | POA: Diagnosis not present

## 2020-04-18 DIAGNOSIS — M9901 Segmental and somatic dysfunction of cervical region: Secondary | ICD-10-CM | POA: Diagnosis not present

## 2020-04-18 DIAGNOSIS — M9902 Segmental and somatic dysfunction of thoracic region: Secondary | ICD-10-CM | POA: Diagnosis not present

## 2020-04-18 DIAGNOSIS — M9903 Segmental and somatic dysfunction of lumbar region: Secondary | ICD-10-CM | POA: Diagnosis not present

## 2020-04-18 DIAGNOSIS — M9905 Segmental and somatic dysfunction of pelvic region: Secondary | ICD-10-CM | POA: Diagnosis not present

## 2020-05-01 DIAGNOSIS — M9902 Segmental and somatic dysfunction of thoracic region: Secondary | ICD-10-CM | POA: Diagnosis not present

## 2020-05-01 DIAGNOSIS — M9901 Segmental and somatic dysfunction of cervical region: Secondary | ICD-10-CM | POA: Diagnosis not present

## 2020-05-01 DIAGNOSIS — M9905 Segmental and somatic dysfunction of pelvic region: Secondary | ICD-10-CM | POA: Diagnosis not present

## 2020-05-01 DIAGNOSIS — M9903 Segmental and somatic dysfunction of lumbar region: Secondary | ICD-10-CM | POA: Diagnosis not present

## 2020-05-29 DIAGNOSIS — M9903 Segmental and somatic dysfunction of lumbar region: Secondary | ICD-10-CM | POA: Diagnosis not present

## 2020-05-29 DIAGNOSIS — M9902 Segmental and somatic dysfunction of thoracic region: Secondary | ICD-10-CM | POA: Diagnosis not present

## 2020-05-29 DIAGNOSIS — M9901 Segmental and somatic dysfunction of cervical region: Secondary | ICD-10-CM | POA: Diagnosis not present

## 2020-05-29 DIAGNOSIS — M9905 Segmental and somatic dysfunction of pelvic region: Secondary | ICD-10-CM | POA: Diagnosis not present

## 2020-06-19 DIAGNOSIS — M9901 Segmental and somatic dysfunction of cervical region: Secondary | ICD-10-CM | POA: Diagnosis not present

## 2020-06-19 DIAGNOSIS — M9905 Segmental and somatic dysfunction of pelvic region: Secondary | ICD-10-CM | POA: Diagnosis not present

## 2020-06-19 DIAGNOSIS — M9902 Segmental and somatic dysfunction of thoracic region: Secondary | ICD-10-CM | POA: Diagnosis not present

## 2020-06-19 DIAGNOSIS — M9903 Segmental and somatic dysfunction of lumbar region: Secondary | ICD-10-CM | POA: Diagnosis not present

## 2020-07-01 DIAGNOSIS — J310 Chronic rhinitis: Secondary | ICD-10-CM | POA: Diagnosis not present

## 2020-07-01 DIAGNOSIS — J338 Other polyp of sinus: Secondary | ICD-10-CM | POA: Diagnosis not present

## 2020-07-01 DIAGNOSIS — J343 Hypertrophy of nasal turbinates: Secondary | ICD-10-CM | POA: Diagnosis not present

## 2020-07-13 IMAGING — MR MR HEAD W/O CM
11 series · 43 of 48 positions shown · non-contrast
Comparison: CT head without contrast 05/31/2017

CLINICAL DATA: Vertigo, persistent, central. Profound dizziness for
4 days.

EXAM:
MRI HEAD WITHOUT CONTRAST
TECHNIQUE: Multiplanar, multiecho pulse sequences of the brain and surrounding
structures were obtained without intravenous contrast.

[Series 3: T1 · sagittal · 5.0mm · 0.47mm/px · 3 of 24 slices shown]
[im 1/24]
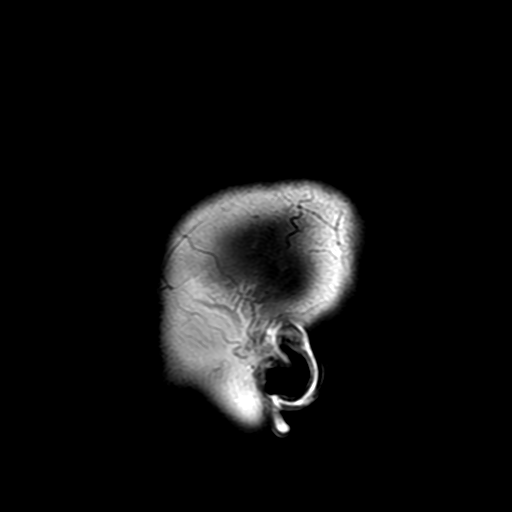
[im 12/24]
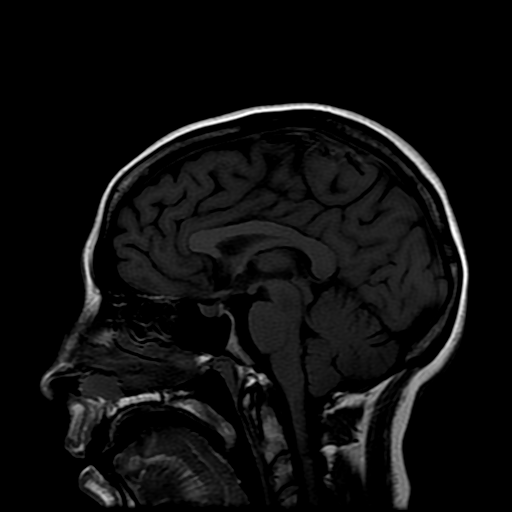
[im 24/24]
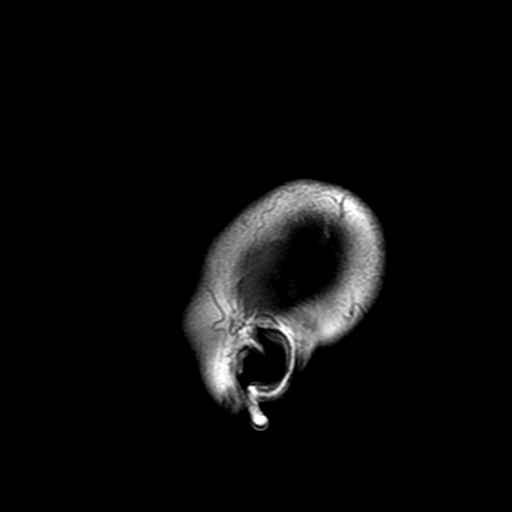

[Series 4: DWI · axial · 3.0mm · 1.09mm/px · z∈[+40,+171]mm · 8 of 90 slices shown (1 of 4)]
[im 1/90]
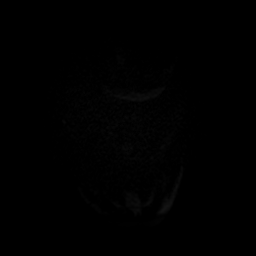
[im 10/90]
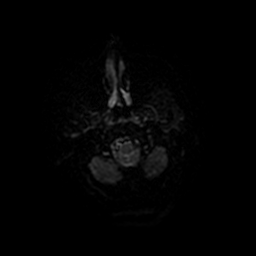
[im 30/90]
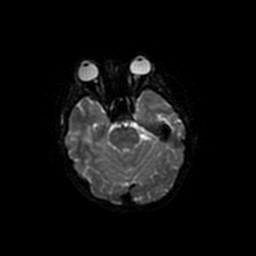
[im 40/90]
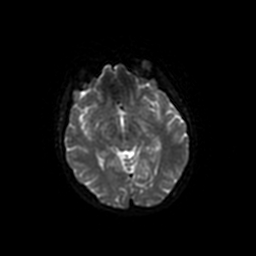
[im 50/90]
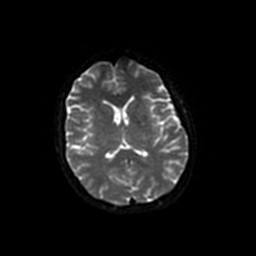
[im 60/90]
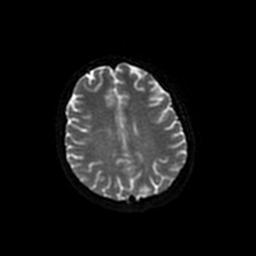
[im 80/90]
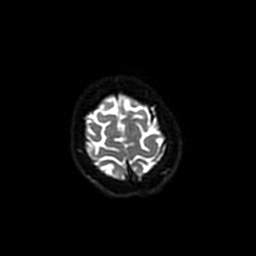
[im 90/90]
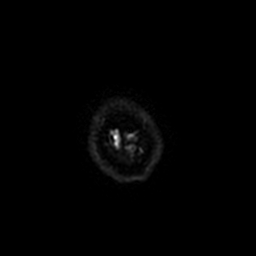

[Series 5: T2 · axial · 5.0mm · 0.86mm/px · z∈[+36,+169]mm · 2 of 20 slices shown (1 of 2)]
[im 1/20]
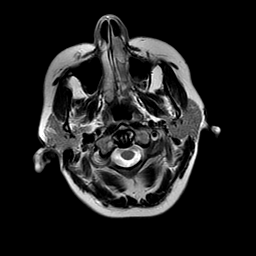
[im 20/20]
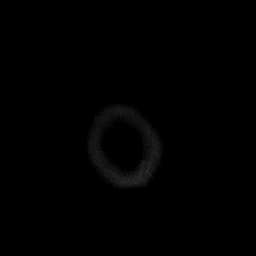

[Series 6: FLAIR · axial · 5.0mm · 0.43mm/px · z∈[+36,+169]mm · 2 of 20 slices shown (1 of 2)]
[im 1/20]
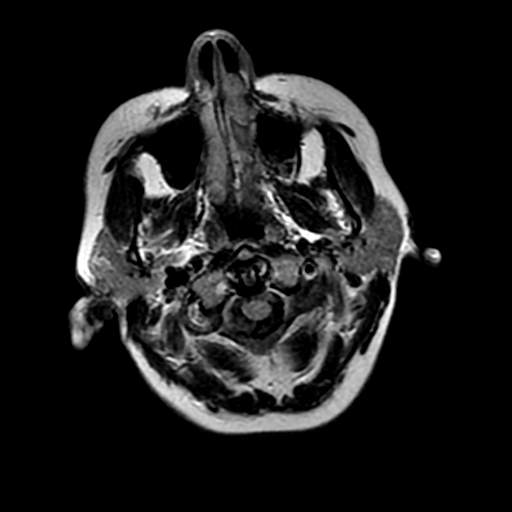
[im 20/20]
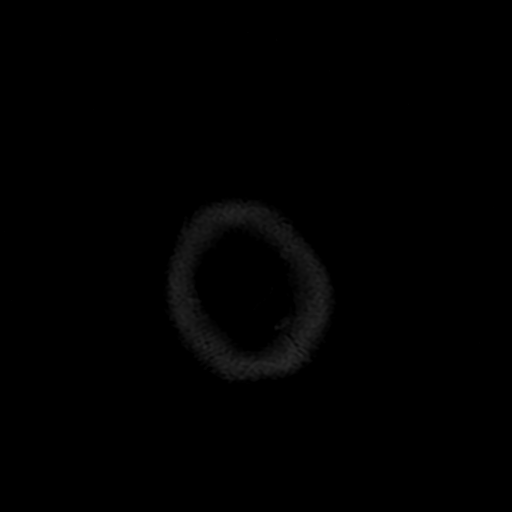

[Series 7: DWI · coronal · 4.0mm · 1.09mm/px · 9 of 82 slices shown (2 of 4)]
[im 1/82]
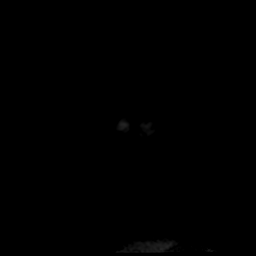
[im 11/82]
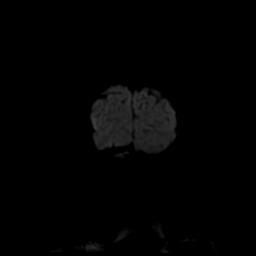
[im 21/82]
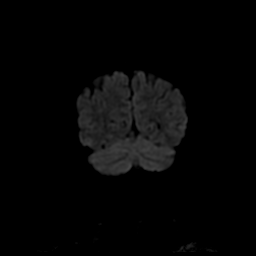
[im 31/82]
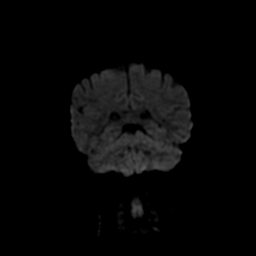
[im 41/82]
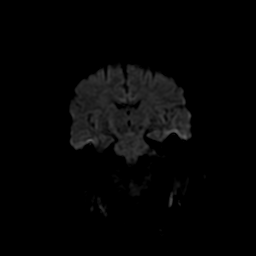
[im 51/82]
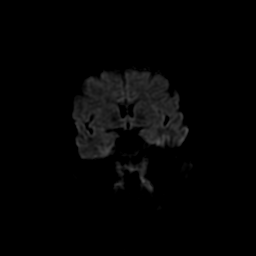
[im 61/82]
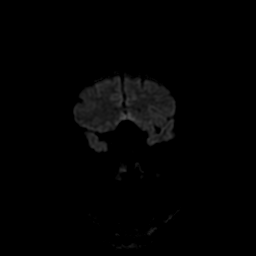
[im 71/82]
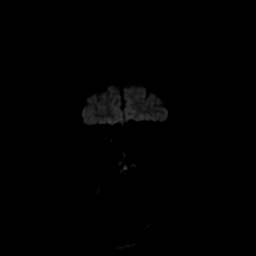
[im 82/82]
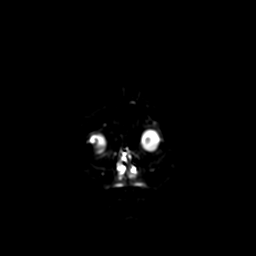

[Series 8: ax mpgr · axial · 5.0mm · 0.43mm/px · z∈[+36,+169]mm · 2 of 20 slices shown]
[im 1/20]
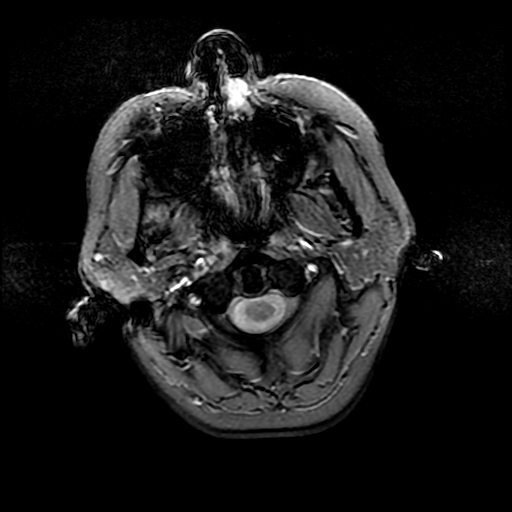
[im 20/20]
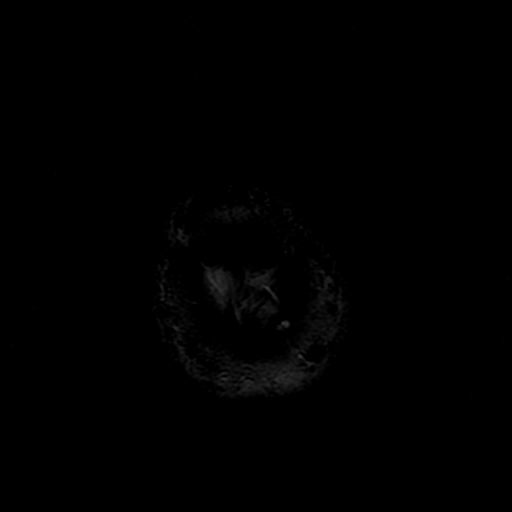

[Series 9: ax fspgr irp · axial · 3.0mm · 0.47mm/px · z∈[+36,+69]mm · 2 of 46 slices shown]
[im 1/46]
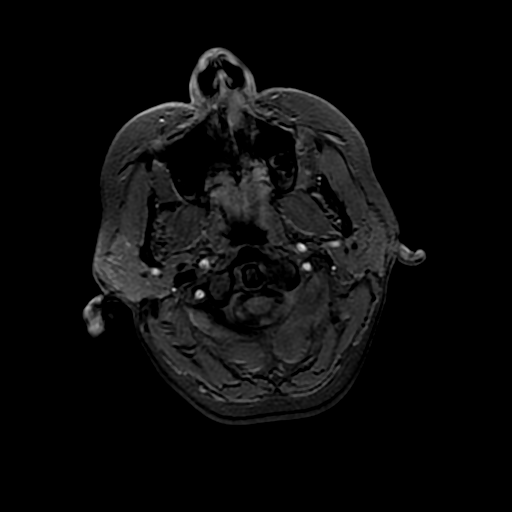
[im 12/46]
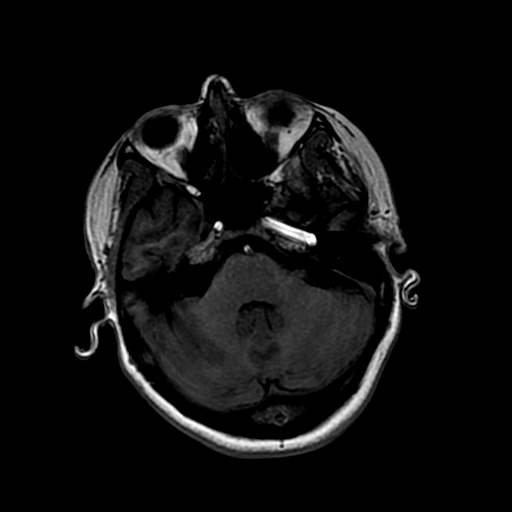

[Series 10: T2 · coronal · 5.0mm · 0.90mm/px · 3 of 26 slices shown (2 of 2)]
[im 1/26]
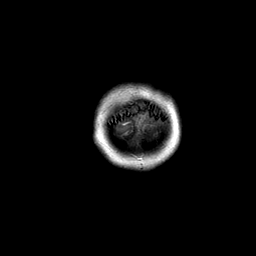
[im 13/26]
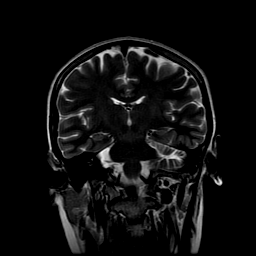
[im 26/26]
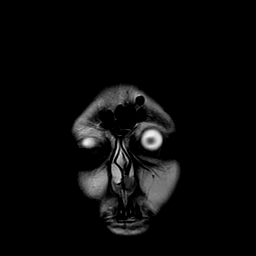

[Series 11: FLAIR · axial · 5.0mm · 0.86mm/px · z∈[+34,+171]mm · 3 of 24 slices shown (2 of 2)]
[im 1/24]
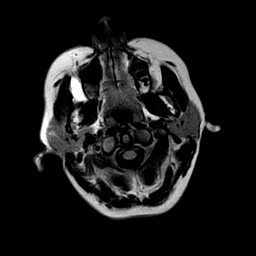
[im 12/24]
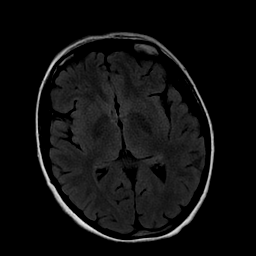
[im 24/24]
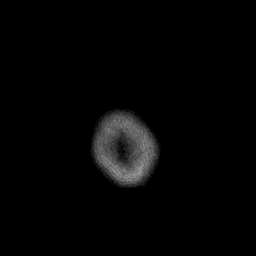

[Series 400: DWI · axial · 3.0mm · 1.09mm/px · z∈[+40,+171]mm · 5 of 45 slices shown (3 of 4)]
[im 1/45]
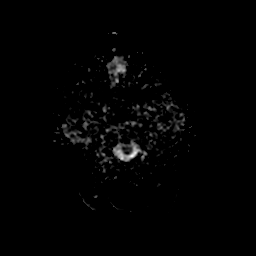
[im 12/45]
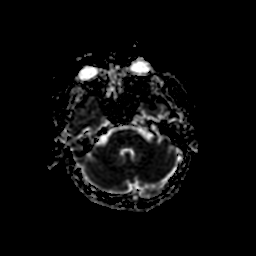
[im 23/45]
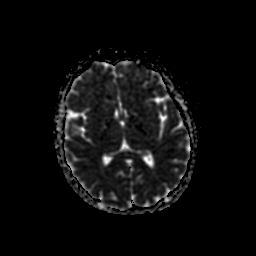
[im 34/45]
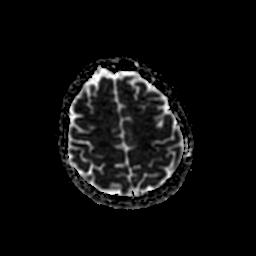
[im 45/45]
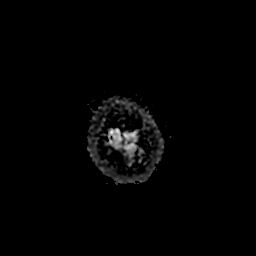

[Series 700: DWI · coronal · 4.0mm · 1.09mm/px · 4 of 41 slices shown (4 of 4)]
[im 1/41]
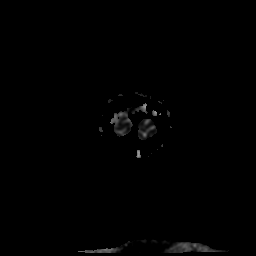
[im 14/41]
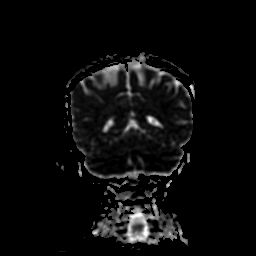
[im 27/41]
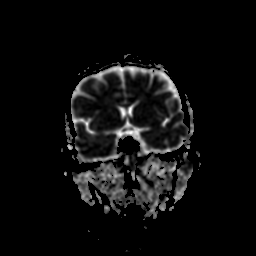
[im 41/41]
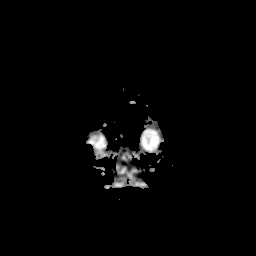

[43 of 48 positions shown; findings below may reference images not displayed]

FINDINGS: Brain: The diffusion-weighted images demonstrate no acute or
subacute infarction. No acute abnormalities are present in the
posterior fossa. Cerebellar tonsils extend to the foramen magnum
without a Chiari malformation. The fourth ventricle is of normal
size

No acute hemorrhage or mass lesion is present. The ventricles are of
normal size. No significant extra-axial fluid collection is present

Vascular: Flow is present in the major intracranial arteries.

Skull and upper cervical spine: The skull base is within normal
limits. The craniocervical junction is normal. The upper cervical
spine is within normal limits.

Sinuses/Orbits: The paranasal sinuses and mastoid air cells are
clear. Globes and orbits are within normal limits. Inner ear
structures are unremarkable.
IMPRESSION: Normal MRI the brain. No acute or focal lesion to explain the
patient's symptoms.

## 2020-07-17 DIAGNOSIS — M9903 Segmental and somatic dysfunction of lumbar region: Secondary | ICD-10-CM | POA: Diagnosis not present

## 2020-07-17 DIAGNOSIS — M9905 Segmental and somatic dysfunction of pelvic region: Secondary | ICD-10-CM | POA: Diagnosis not present

## 2020-07-17 DIAGNOSIS — M9902 Segmental and somatic dysfunction of thoracic region: Secondary | ICD-10-CM | POA: Diagnosis not present

## 2020-07-17 DIAGNOSIS — M9901 Segmental and somatic dysfunction of cervical region: Secondary | ICD-10-CM | POA: Diagnosis not present

## 2020-07-25 ENCOUNTER — Other Ambulatory Visit (INDEPENDENT_AMBULATORY_CARE_PROVIDER_SITE_OTHER): Payer: Self-pay | Admitting: Otolaryngology

## 2020-07-25 DIAGNOSIS — D367 Benign neoplasm of other specified sites: Secondary | ICD-10-CM | POA: Diagnosis not present

## 2020-07-25 DIAGNOSIS — D140 Benign neoplasm of middle ear, nasal cavity and accessory sinuses: Secondary | ICD-10-CM | POA: Diagnosis not present

## 2020-08-27 DIAGNOSIS — M9905 Segmental and somatic dysfunction of pelvic region: Secondary | ICD-10-CM | POA: Diagnosis not present

## 2020-08-27 DIAGNOSIS — M9903 Segmental and somatic dysfunction of lumbar region: Secondary | ICD-10-CM | POA: Diagnosis not present

## 2020-08-27 DIAGNOSIS — M9902 Segmental and somatic dysfunction of thoracic region: Secondary | ICD-10-CM | POA: Diagnosis not present

## 2020-08-27 DIAGNOSIS — M9901 Segmental and somatic dysfunction of cervical region: Secondary | ICD-10-CM | POA: Diagnosis not present

## 2020-09-18 DIAGNOSIS — M9903 Segmental and somatic dysfunction of lumbar region: Secondary | ICD-10-CM | POA: Diagnosis not present

## 2020-09-18 DIAGNOSIS — M9902 Segmental and somatic dysfunction of thoracic region: Secondary | ICD-10-CM | POA: Diagnosis not present

## 2020-09-18 DIAGNOSIS — M9901 Segmental and somatic dysfunction of cervical region: Secondary | ICD-10-CM | POA: Diagnosis not present

## 2020-09-18 DIAGNOSIS — M9905 Segmental and somatic dysfunction of pelvic region: Secondary | ICD-10-CM | POA: Diagnosis not present

## 2020-10-17 DIAGNOSIS — M9905 Segmental and somatic dysfunction of pelvic region: Secondary | ICD-10-CM | POA: Diagnosis not present

## 2020-10-17 DIAGNOSIS — M9902 Segmental and somatic dysfunction of thoracic region: Secondary | ICD-10-CM | POA: Diagnosis not present

## 2020-10-17 DIAGNOSIS — M9901 Segmental and somatic dysfunction of cervical region: Secondary | ICD-10-CM | POA: Diagnosis not present

## 2020-10-17 DIAGNOSIS — M9903 Segmental and somatic dysfunction of lumbar region: Secondary | ICD-10-CM | POA: Diagnosis not present

## 2020-10-23 ENCOUNTER — Other Ambulatory Visit (HOSPITAL_COMMUNITY)
Admission: RE | Admit: 2020-10-23 | Discharge: 2020-10-23 | Disposition: A | Discharge status: Home / Self Care | Payer: BC Managed Care – PPO | Source: Ambulatory Visit | Attending: Obstetrics and Gynecology | Admitting: Obstetrics and Gynecology

## 2020-10-23 ENCOUNTER — Ambulatory Visit (INDEPENDENT_AMBULATORY_CARE_PROVIDER_SITE_OTHER): Payer: BC Managed Care – PPO | Admitting: Obstetrics and Gynecology

## 2020-10-23 ENCOUNTER — Encounter: Payer: Self-pay | Admitting: Obstetrics and Gynecology

## 2020-10-23 ENCOUNTER — Other Ambulatory Visit: Payer: Self-pay

## 2020-10-23 VITALS — BP 114/60 | HR 76 | Resp 18 | Ht 63.5 in | Wt 139.0 lb

## 2020-10-23 DIAGNOSIS — K5900 Constipation, unspecified: Secondary | ICD-10-CM

## 2020-10-23 DIAGNOSIS — Z124 Encounter for screening for malignant neoplasm of cervix: Secondary | ICD-10-CM

## 2020-10-23 DIAGNOSIS — E559 Vitamin D deficiency, unspecified: Secondary | ICD-10-CM

## 2020-10-23 DIAGNOSIS — Z803 Family history of malignant neoplasm of breast: Secondary | ICD-10-CM

## 2020-10-23 DIAGNOSIS — Z01419 Encounter for gynecological examination (general) (routine) without abnormal findings: Secondary | ICD-10-CM

## 2020-10-23 DIAGNOSIS — Z0000 Encounter for general adult medical examination without abnormal findings: Secondary | ICD-10-CM | POA: Diagnosis not present

## 2020-10-23 NOTE — Patient Instructions (Signed)

## 2020-10-23 NOTE — Progress Notes (Signed)
40 y.o. G0P0 Married White or Caucasian Not Hispanic or Latino female here for annual exam.   Her wife is currently [redacted] weeks pregnant with Kris's egg (boy) Period Cycle (Days): 28 Period Duration (Days): 5-7 Period Pattern: Regular Menstrual Flow: Moderate Menstrual Control: Tampon Menstrual Control Change Freq (Hours): 4-6 Dysmenorrhea: (!) Mild Dysmenorrhea Symptoms: Cramping  Patient's last menstrual period was 10/13/2020.          Sexually active: Yes.   female partner The current method of family planning is none.    Exercising: Yes.    functional strength training and weights Smoker:  Patient vapes  Health Maintenance: Pap:  09/23/17 Neg:Neg HR HPV History of abnormal Pap:  Yes, LEEP for CIN III in 5/14 MMG:  07/19/19 BIRADS 1 negative/density d TDaP:  09/04/14 Gardasil: never   reports that she has quit smoking. Her smoking use included cigarettes. She has a 19.00 pack-year smoking history. She has never used smokeless tobacco. She reports current alcohol use. She reports that she does not use drugs. 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 Wife is a Corporate treasurer  Past Medical History:  Diagnosis Date  . Carbuncle of face    forehead  . Deviated septum 07/2018  . Nasal mass 07/2018  . Nasal turbinate hypertrophy 07/2018   bilateral    Past Surgical History:  Procedure Laterality Date  . EXCISION NASAL MASS Left 08/30/2018   Procedure: EXCISION OF NASAL MASS;  Surgeon: Leta Baptist, MD;  Location: [place];  Service: ENT;  Laterality: Left;  . FINGER TENDON REPAIR Right 06/23/2010   long finger  . INCISION AND DRAINAGE BREAST ABSCESS Left 06/09/2011  . NASAL SEPTOPLASTY W/ TURBINOPLASTY Bilateral 08/30/2018   Procedure: NASAL SEPTOPLASTY WITH TURBINATE REDUCTION;  Surgeon: Leta Baptist, MD;  Location: [place];  Service: ENT;  Laterality: Bilateral;  . TRIGGER FINGER RELEASE Right 06/23/2010   long  finger  . WISDOM TOOTH EXTRACTION      Current Outpatient Medications  Medication Sig Dispense Refill  . acetaminophen (TYLENOL) 500 MG tablet Take 1,000 mg by mouth every 6 (six) hours as needed.    . Cholecalciferol (VITAMIN D3) 25 MCG (1000 UT) CAPS Take by mouth.    . Cyanocobalamin (B-12) 1000 MCG TABS Take by mouth.    . Fexofenadine-Pseudoephedrine (ALLEGRA-D 24 HOUR PO) Take by mouth.    . fluticasone (FLONASE) 50 MCG/ACT nasal spray Place into both nostrils daily.    . Ibuprofen (ADVIL PO) Take by mouth as needed.    . Multiple Vitamins-Minerals (CENTRUM WOMEN PO) Take by mouth.     No current facility-administered medications for this visit.    Family History  Problem Relation Age of Onset  . Breast cancer Mother 30  . Cancer Mother        Breast  . Osteoarthritis Maternal Grandmother   . Cancer Maternal Grandmother        Lung  . Osteoarthritis Maternal Grandfather   . Heart failure Maternal Grandfather     Review of Systems  Constitutional: Negative.   HENT: Negative.   Eyes: Negative.   Respiratory: Negative.   Cardiovascular: Negative.   Gastrointestinal: Negative.   Endocrine: Negative.   Genitourinary: Negative.   Musculoskeletal: Negative.   Skin: Negative.   Allergic/Immunologic: Negative.   Neurological: Negative.   Hematological: Negative.   Psychiatric/Behavioral: Negative.   She has dry skin, some constipation   Exam:   BP 114/60 (BP  Location: Right Arm, Patient Position: Sitting, Cuff Size: Normal)   Pulse 76   Resp 18   Ht 5' 3.5" (1.613 m)   Wt 139 lb (63 kg)   LMP 10/13/2020   BMI 24.24 kg/m   Weight change: @WEIGHTCHANGE @ Height:   Height: 5' 3.5" (161.3 cm)  Ht Readings from Last 3 Encounters:  10/23/20 5' 3.5" (1.613 m)  10/16/19 5' 3.75" (1.619 m)  05/02/19 5\' 4"  (1.626 m)    General appearance: alert, cooperative and appears stated age Head: Normocephalic, without obvious abnormality, atraumatic Neck: no adenopathy, supple,  symmetrical, trachea midline and thyroid normal to inspection and palpation Lungs: clear to auscultation bilaterally Cardiovascular: regular rate and rhythm Breasts: normal appearance, no masses or tenderness Abdomen: soft, non-tender; non distended,  no masses,  no organomegaly Extremities: extremities normal, atraumatic, no cyanosis or edema Skin: Skin color, texture, turgor normal. No rashes or lesions Lymph nodes: Cervical, supraclavicular, and axillary nodes normal. No abnormal inguinal nodes palpated Neurologic: Grossly normal   Pelvic: External genitalia:  no lesions              Urethra:  normal appearing urethra with no masses, tenderness or lesions              Bartholins and Skenes: normal                 Vagina: normal appearing vagina with normal color and discharge, no lesions              Cervix: no lesions               Bimanual Exam:  Uterus:  normal size, contour, position, consistency, mobility, non-tender              Adnexa: no mass, fullness, tenderness               Rectovaginal: Confirms               Anus:  normal sphincter tone, no lesions  Marisa Sprinkles chaperoned for the exam.  1. Well woman exam Discussed breast self exam Discussed calcium and vit D intake   2. Family history of breast cancer Mammogram due - Ambulatory referral to Genetics  3. Screening for cervical cancer  - Cytology - PAP  4. Laboratory exam ordered as part of routine general medical examination  - CBC; Future - Comprehensive metabolic panel; Future - Lipid panel; Future  5. Vitamin D deficiency  - VITAMIN D 25 Hydroxy (Vit-D Deficiency, Fractures); Future  6. Constipation, unspecified constipation type. Trouble with weight loss  - TSH, future

## 2020-10-25 ENCOUNTER — Other Ambulatory Visit: Payer: Self-pay

## 2020-10-25 ENCOUNTER — Other Ambulatory Visit (INDEPENDENT_AMBULATORY_CARE_PROVIDER_SITE_OTHER): Payer: BC Managed Care – PPO

## 2020-10-25 DIAGNOSIS — K5900 Constipation, unspecified: Secondary | ICD-10-CM

## 2020-10-25 DIAGNOSIS — E559 Vitamin D deficiency, unspecified: Secondary | ICD-10-CM | POA: Diagnosis not present

## 2020-10-25 DIAGNOSIS — Z0000 Encounter for general adult medical examination without abnormal findings: Secondary | ICD-10-CM | POA: Diagnosis not present

## 2020-10-25 DIAGNOSIS — Z13220 Encounter for screening for lipoid disorders: Secondary | ICD-10-CM | POA: Diagnosis not present

## 2020-10-25 LAB — CYTOLOGY - PAP
Comment: NEGATIVE
Diagnosis: NEGATIVE
High risk HPV: NEGATIVE

## 2020-10-26 LAB — CBC
HCT: 40.4 % (ref 35.0–45.0)
Hemoglobin: 13.6 g/dL (ref 11.7–15.5)
MCH: 32.4 pg (ref 27.0–33.0)
MCHC: 33.7 g/dL (ref 32.0–36.0)
MCV: 96.2 fL (ref 80.0–100.0)
MPV: 11.8 fL (ref 7.5–12.5)
Platelets: 217 10*3/uL (ref 140–400)
RBC: 4.2 10*6/uL (ref 3.80–5.10)
RDW: 12 % (ref 11.0–15.0)
WBC: 4.7 10*3/uL (ref 3.8–10.8)

## 2020-10-26 LAB — TSH: TSH: 1.88 mIU/L

## 2020-10-26 LAB — LIPID PANEL
Cholesterol: 143 mg/dL (ref <200)
HDL: 35 mg/dL — ABNORMAL LOW (ref >=50)
LDL Cholesterol (Calc): 88 mg/dL (calc)
Non-HDL Cholesterol (Calc): 108 mg/dL (calc) (ref <130)
Total CHOL/HDL Ratio: 4.1 (calc) (ref <5.0)
Triglycerides: 107 mg/dL (ref <150)

## 2020-10-26 LAB — COMPREHENSIVE METABOLIC PANEL
AG Ratio: 2.4 (calc) (ref 1.0–2.5)
ALT: 14 U/L (ref 6–29)
AST: 15 U/L (ref 10–30)
Albumin: 4.3 g/dL (ref 3.6–5.1)
Alkaline phosphatase (APISO): 43 U/L (ref 31–125)
BUN: 14 mg/dL (ref 7–25)
CO2: 28 mmol/L (ref 20–32)
Calcium: 9.1 mg/dL (ref 8.6–10.2)
Chloride: 106 mmol/L (ref 98–110)
Creat: 0.65 mg/dL (ref 0.50–1.10)
Globulin: 1.8 g/dL (calc) — ABNORMAL LOW (ref 1.9–3.7)
Glucose, Bld: 81 mg/dL (ref 65–99)
Potassium: 4.2 mmol/L (ref 3.5–5.3)
Sodium: 139 mmol/L (ref 135–146)
Total Bilirubin: 0.5 mg/dL (ref 0.2–1.2)
Total Protein: 6.1 g/dL (ref 6.1–8.1)

## 2020-10-26 LAB — VITAMIN D 25 HYDROXY (VIT D DEFICIENCY, FRACTURES): Vit D, 25-Hydroxy: 28 ng/mL — ABNORMAL LOW (ref 30–100)

## 2020-10-27 ENCOUNTER — Encounter: Payer: Self-pay | Admitting: Obstetrics and Gynecology

## 2020-11-05 DIAGNOSIS — J310 Chronic rhinitis: Secondary | ICD-10-CM | POA: Diagnosis not present

## 2020-11-05 DIAGNOSIS — J330 Polyp of nasal cavity: Secondary | ICD-10-CM | POA: Diagnosis not present

## 2020-11-14 DIAGNOSIS — M9902 Segmental and somatic dysfunction of thoracic region: Secondary | ICD-10-CM | POA: Diagnosis not present

## 2020-11-14 DIAGNOSIS — M9905 Segmental and somatic dysfunction of pelvic region: Secondary | ICD-10-CM | POA: Diagnosis not present

## 2020-11-14 DIAGNOSIS — M9901 Segmental and somatic dysfunction of cervical region: Secondary | ICD-10-CM | POA: Diagnosis not present

## 2020-11-14 DIAGNOSIS — M9903 Segmental and somatic dysfunction of lumbar region: Secondary | ICD-10-CM | POA: Diagnosis not present

## 2020-11-27 DIAGNOSIS — M545 Low back pain, unspecified: Secondary | ICD-10-CM | POA: Diagnosis not present

## 2020-12-02 DIAGNOSIS — M545 Low back pain, unspecified: Secondary | ICD-10-CM | POA: Diagnosis not present

## 2020-12-10 DIAGNOSIS — M545 Low back pain, unspecified: Secondary | ICD-10-CM | POA: Diagnosis not present

## 2020-12-12 DIAGNOSIS — M545 Low back pain, unspecified: Secondary | ICD-10-CM | POA: Diagnosis not present

## 2020-12-12 DIAGNOSIS — M9903 Segmental and somatic dysfunction of lumbar region: Secondary | ICD-10-CM | POA: Diagnosis not present

## 2020-12-12 DIAGNOSIS — M9902 Segmental and somatic dysfunction of thoracic region: Secondary | ICD-10-CM | POA: Diagnosis not present

## 2020-12-12 DIAGNOSIS — M9901 Segmental and somatic dysfunction of cervical region: Secondary | ICD-10-CM | POA: Diagnosis not present

## 2020-12-12 DIAGNOSIS — M9905 Segmental and somatic dysfunction of pelvic region: Secondary | ICD-10-CM | POA: Diagnosis not present

## 2020-12-17 DIAGNOSIS — J330 Polyp of nasal cavity: Secondary | ICD-10-CM | POA: Diagnosis not present

## 2020-12-17 DIAGNOSIS — J310 Chronic rhinitis: Secondary | ICD-10-CM | POA: Diagnosis not present

## 2020-12-24 DIAGNOSIS — M545 Low back pain, unspecified: Secondary | ICD-10-CM | POA: Diagnosis not present

## 2021-01-01 DIAGNOSIS — M545 Low back pain, unspecified: Secondary | ICD-10-CM | POA: Diagnosis not present

## 2021-01-07 DIAGNOSIS — M545 Low back pain, unspecified: Secondary | ICD-10-CM | POA: Diagnosis not present

## 2021-01-14 DIAGNOSIS — M545 Low back pain, unspecified: Secondary | ICD-10-CM | POA: Diagnosis not present

## 2021-01-23 DIAGNOSIS — M9902 Segmental and somatic dysfunction of thoracic region: Secondary | ICD-10-CM | POA: Diagnosis not present

## 2021-01-23 DIAGNOSIS — M9901 Segmental and somatic dysfunction of cervical region: Secondary | ICD-10-CM | POA: Diagnosis not present

## 2021-01-23 DIAGNOSIS — M9903 Segmental and somatic dysfunction of lumbar region: Secondary | ICD-10-CM | POA: Diagnosis not present

## 2021-01-23 DIAGNOSIS — M9905 Segmental and somatic dysfunction of pelvic region: Secondary | ICD-10-CM | POA: Diagnosis not present

## 2021-01-27 DIAGNOSIS — J338 Other polyp of sinus: Secondary | ICD-10-CM | POA: Diagnosis not present

## 2021-01-27 DIAGNOSIS — J310 Chronic rhinitis: Secondary | ICD-10-CM | POA: Diagnosis not present

## 2021-01-28 ENCOUNTER — Other Ambulatory Visit: Payer: Self-pay | Admitting: Otolaryngology

## 2021-01-28 DIAGNOSIS — M545 Low back pain, unspecified: Secondary | ICD-10-CM | POA: Diagnosis not present

## 2021-02-04 DIAGNOSIS — M545 Low back pain, unspecified: Secondary | ICD-10-CM | POA: Diagnosis not present

## 2021-02-10 ENCOUNTER — Other Ambulatory Visit: Payer: Self-pay

## 2021-02-10 ENCOUNTER — Encounter (HOSPITAL_BASED_OUTPATIENT_CLINIC_OR_DEPARTMENT_OTHER): Payer: Self-pay | Admitting: Otolaryngology

## 2021-02-13 DIAGNOSIS — M545 Low back pain, unspecified: Secondary | ICD-10-CM | POA: Diagnosis not present

## 2021-02-14 ENCOUNTER — Inpatient Hospital Stay (HOSPITAL_COMMUNITY): Admission: RE | Admit: 2021-02-14 | Payer: 59 | Source: Ambulatory Visit

## 2021-03-04 DIAGNOSIS — M545 Low back pain, unspecified: Secondary | ICD-10-CM | POA: Diagnosis not present

## 2021-03-11 ENCOUNTER — Encounter (HOSPITAL_BASED_OUTPATIENT_CLINIC_OR_DEPARTMENT_OTHER): Payer: Self-pay | Admitting: Otolaryngology

## 2021-03-11 ENCOUNTER — Other Ambulatory Visit: Payer: Self-pay

## 2021-03-11 DIAGNOSIS — M545 Low back pain, unspecified: Secondary | ICD-10-CM | POA: Diagnosis not present

## 2021-03-14 DIAGNOSIS — M9901 Segmental and somatic dysfunction of cervical region: Secondary | ICD-10-CM | POA: Diagnosis not present

## 2021-03-14 DIAGNOSIS — M9905 Segmental and somatic dysfunction of pelvic region: Secondary | ICD-10-CM | POA: Diagnosis not present

## 2021-03-14 DIAGNOSIS — M9903 Segmental and somatic dysfunction of lumbar region: Secondary | ICD-10-CM | POA: Diagnosis not present

## 2021-03-14 DIAGNOSIS — M9902 Segmental and somatic dysfunction of thoracic region: Secondary | ICD-10-CM | POA: Diagnosis not present

## 2021-03-18 ENCOUNTER — Encounter (HOSPITAL_BASED_OUTPATIENT_CLINIC_OR_DEPARTMENT_OTHER)
Admission: RE | Disposition: A | Discharge status: Home / Self Care | Payer: Self-pay | Source: Home / Self Care | Attending: Otolaryngology

## 2021-03-18 ENCOUNTER — Other Ambulatory Visit: Payer: Self-pay

## 2021-03-18 ENCOUNTER — Ambulatory Visit (HOSPITAL_BASED_OUTPATIENT_CLINIC_OR_DEPARTMENT_OTHER): Payer: BC Managed Care – PPO | Admitting: Anesthesiology

## 2021-03-18 ENCOUNTER — Ambulatory Visit (HOSPITAL_BASED_OUTPATIENT_CLINIC_OR_DEPARTMENT_OTHER)
Admission: RE | Admit: 2021-03-18 | Discharge: 2021-03-18 | Disposition: A | Discharge status: Home / Self Care | Payer: BC Managed Care – PPO | Attending: Otolaryngology | Admitting: Otolaryngology

## 2021-03-18 ENCOUNTER — Encounter (HOSPITAL_BASED_OUTPATIENT_CLINIC_OR_DEPARTMENT_OTHER): Payer: Self-pay | Admitting: Otolaryngology

## 2021-03-18 DIAGNOSIS — D140 Benign neoplasm of middle ear, nasal cavity and accessory sinuses: Secondary | ICD-10-CM | POA: Diagnosis not present

## 2021-03-18 DIAGNOSIS — D367 Benign neoplasm of other specified sites: Secondary | ICD-10-CM | POA: Diagnosis not present

## 2021-03-18 HISTORY — PX: EXCISION NASAL MASS: SHX6271

## 2021-03-18 LAB — POCT PREGNANCY, URINE: Preg Test, Ur: NEGATIVE

## 2021-03-18 SURGERY — EXCISION, MASS, NOSE
Anesthesia: General | Site: Nose | Laterality: Left

## 2021-03-18 MED ORDER — ONDANSETRON HCL 4 MG/2ML IJ SOLN
INTRAMUSCULAR | Status: DC | PRN
  Administered 2021-03-18: 4 mg via INTRAVENOUS

## 2021-03-18 MED ORDER — DEXAMETHASONE SODIUM PHOSPHATE 4 MG/ML IJ SOLN
INTRAMUSCULAR | Status: DC | PRN
  Administered 2021-03-18: 10 mg via INTRAVENOUS

## 2021-03-18 MED ORDER — PROPOFOL 10 MG/ML IV BOLUS: INTRAVENOUS | Status: DC | PRN

## 2021-03-18 MED ORDER — LACTATED RINGERS IV SOLN: INTRAVENOUS | Status: DC

## 2021-03-18 MED ORDER — OXYMETAZOLINE HCL 0.05 % NA SOLN
NASAL | Status: DC | PRN
  Administered 2021-03-18: 1 via TOPICAL

## 2021-03-18 MED ORDER — LIDOCAINE HCL (PF) 2 % IJ SOLN
INTRAMUSCULAR | Status: AC
  Filled 2021-03-18: qty 5

## 2021-03-18 MED ORDER — FENTANYL CITRATE (PF) 100 MCG/2ML IJ SOLN
25.0000 ug | INTRAMUSCULAR | Status: DC | PRN
  Administered 2021-03-18 (×2): 50 ug via INTRAVENOUS

## 2021-03-18 MED ORDER — MIDAZOLAM HCL 2 MG/2ML IJ SOLN
INTRAMUSCULAR | Status: AC
  Filled 2021-03-18: qty 2

## 2021-03-18 MED ORDER — ONDANSETRON HCL 4 MG/2ML IJ SOLN
INTRAMUSCULAR | Status: AC
  Filled 2021-03-18: qty 2

## 2021-03-18 MED ORDER — CEFAZOLIN SODIUM-DEXTROSE 2-3 GM-%(50ML) IV SOLR
INTRAVENOUS | Status: DC | PRN
  Administered 2021-03-18: 2 g via INTRAVENOUS

## 2021-03-18 MED ORDER — LIDOCAINE-EPINEPHRINE 1 %-1:100000 IJ SOLN
INTRAMUSCULAR | Status: DC | PRN
  Administered 2021-03-18: 2 mL

## 2021-03-18 MED ORDER — LIDOCAINE HCL (CARDIAC) PF 100 MG/5ML IV SOSY
PREFILLED_SYRINGE | INTRAVENOUS | Status: DC | PRN
  Administered 2021-03-18: 60 mg via INTRAVENOUS

## 2021-03-18 MED ORDER — PROMETHAZINE HCL 25 MG/ML IJ SOLN
6.2500 mg | INTRAMUSCULAR | Status: DC | PRN
Start: 2021-03-18 — End: 2021-03-18

## 2021-03-18 MED ORDER — FENTANYL CITRATE (PF) 100 MCG/2ML IJ SOLN
INTRAMUSCULAR | Status: DC | PRN
  Administered 2021-03-18: 100 ug via INTRAVENOUS

## 2021-03-18 MED ORDER — HYDROCODONE-ACETAMINOPHEN 5-325 MG PO TABS: 1.0000 | ORAL_TABLET | ORAL | 0 refills | Status: AC | PRN

## 2021-03-18 MED ORDER — AMOXICILLIN 875 MG PO TABS: 875.0000 mg | ORAL_TABLET | Freq: Two times a day (BID) | ORAL | 0 refills | Status: AC

## 2021-03-18 MED ORDER — DEXAMETHASONE SODIUM PHOSPHATE 10 MG/ML IJ SOLN
INTRAMUSCULAR | Status: AC
  Filled 2021-03-18: qty 1

## 2021-03-18 MED ORDER — FENTANYL CITRATE (PF) 100 MCG/2ML IJ SOLN
INTRAMUSCULAR | Status: AC
  Filled 2021-03-18: qty 2

## 2021-03-18 MED ORDER — CEFAZOLIN SODIUM 1 G IJ SOLR
INTRAMUSCULAR | Status: AC
  Filled 2021-03-18: qty 20

## 2021-03-18 MED ORDER — ACETAMINOPHEN 10 MG/ML IV SOLN: 1000.0000 mg | Freq: Once | INTRAVENOUS | Status: DC | PRN

## 2021-03-18 MED ORDER — DEXMEDETOMIDINE (PRECEDEX) IN NS 20 MCG/5ML (4 MCG/ML) IV SYRINGE
PREFILLED_SYRINGE | INTRAVENOUS | Status: DC | PRN
  Administered 2021-03-18: 12 ug via INTRAVENOUS

## 2021-03-18 MED ORDER — MIDAZOLAM HCL 5 MG/5ML IJ SOLN
INTRAMUSCULAR | Status: DC | PRN
  Administered 2021-03-18: 2 mg via INTRAVENOUS

## 2021-03-18 MED ORDER — PROPOFOL 10 MG/ML IV BOLUS
INTRAVENOUS | Status: DC | PRN
  Administered 2021-03-18: 130 mg via INTRAVENOUS

## 2021-03-18 SURGICAL SUPPLY — 43 items
BLADE SURG 15 STRL LF DISP TIS (BLADE) IMPLANT
BLADE SURG 15 STRL SS (BLADE) ×3
CANISTER SUCT 1200ML W/VALVE (MISCELLANEOUS) ×3 IMPLANT
COAGULATOR SUCT 8FR VV (MISCELLANEOUS) ×2 IMPLANT
COVER WAND RF STERILE (DRAPES) IMPLANT
DECANTER SPIKE VIAL GLASS SM (MISCELLANEOUS) IMPLANT
DRSG TELFA 3X8 NADH (GAUZE/BANDAGES/DRESSINGS) IMPLANT
ELECT COATED BLADE 2.86 ST (ELECTRODE) IMPLANT
ELECT NDL BLADE 2-5/6 (NEEDLE) IMPLANT
ELECT NEEDLE BLADE 2-5/6 (NEEDLE) ×3 IMPLANT
ELECT REM PT RETURN 9FT ADLT (ELECTROSURGICAL) ×3
ELECTRODE REM PT RTRN 9FT ADLT (ELECTROSURGICAL) ×1 IMPLANT
GLOVE SURG ENC MOIS LTX SZ7.5 (GLOVE) ×3 IMPLANT
GLOVE SURG POLYISO LF SZ6.5 (GLOVE) ×2 IMPLANT
GLOVE SURG POLYISO LF SZ8 (GLOVE) ×2 IMPLANT
GLOVE SURG UNDER POLY LF SZ7 (GLOVE) ×2 IMPLANT
GOWN STRL REUS W/ TWL LRG LVL3 (GOWN DISPOSABLE) ×2 IMPLANT
GOWN STRL REUS W/TWL 2XL LVL3 (GOWN DISPOSABLE) ×2 IMPLANT
GOWN STRL REUS W/TWL LRG LVL3 (GOWN DISPOSABLE) ×6
NDL HYPO 25X1 1.5 SAFETY (NEEDLE) ×1 IMPLANT
NEEDLE HYPO 25X1 1.5 SAFETY (NEEDLE) ×3 IMPLANT
NS IRRIG 1000ML POUR BTL (IV SOLUTION) ×2 IMPLANT
PACK BASIN DAY SURGERY FS (CUSTOM PROCEDURE TRAY) ×3 IMPLANT
PACK ENT DAY SURGERY (CUSTOM PROCEDURE TRAY) ×3 IMPLANT
PAD DRESSING TELFA 3X8 NADH (GAUZE/BANDAGES/DRESSINGS) IMPLANT
PENCIL SMOKE EVACUATOR (MISCELLANEOUS) ×2 IMPLANT
SCRUB TECHNI CARE SURGICAL (MISCELLANEOUS) IMPLANT
SLEEVE SCD COMPRESS KNEE MED (STOCKING) ×1 IMPLANT
SOLUTION BUTLER CLEAR DIP (MISCELLANEOUS) IMPLANT
SPLINT NASAL AIRWAY SILICONE (MISCELLANEOUS) IMPLANT
SPONGE GAUZE 2X2 8PLY STER LF (GAUZE/BANDAGES/DRESSINGS) ×1
SPONGE GAUZE 2X2 8PLY STRL LF (GAUZE/BANDAGES/DRESSINGS) ×2 IMPLANT
SPONGE NEURO XRAY DETECT 1X3 (DISPOSABLE) ×3 IMPLANT
SUT CHROMIC 4 0 P 3 18 (SUTURE) IMPLANT
SUT PLAIN 4 0 ~~LOC~~ 1 (SUTURE) IMPLANT
SUT PROLENE 3 0 PS 2 (SUTURE) IMPLANT
SUT PROLENE 4 0 PS 2 18 (SUTURE) ×2 IMPLANT
SUT VIC AB 4-0 P-3 18XBRD (SUTURE) IMPLANT
SUT VIC AB 4-0 P3 18 (SUTURE)
TOWEL GREEN STERILE FF (TOWEL DISPOSABLE) ×3 IMPLANT
TUBE SALEM SUMP 12R W/ARV (TUBING) IMPLANT
TUBE SALEM SUMP 16 FR W/ARV (TUBING) ×1 IMPLANT
YANKAUER SUCT BULB TIP NO VENT (SUCTIONS) ×1 IMPLANT

## 2021-03-18 NOTE — Discharge Instructions (Signed)
  Post Anesthesia Home Care Instructions  Activity: Get plenty of rest for the remainder of the day. A responsible individual must stay with you for 24 hours following the procedure.  For the next 24 hours, DO NOT: -Drive a car -Paediatric nurse -Drink alcoholic beverages -Take any medication unless instructed by your physician -Make any legal decisions or sign important papers.  Meals: Start with liquid foods such as gelatin or soup. Progress to regular foods as tolerated. Avoid greasy, spicy, heavy foods. If nausea and/or vomiting occur, drink only clear liquids until the nausea and/or vomiting subsides. Call your physician if vomiting continues.  Special Instructions/Symptoms: Your throat may feel dry or sore from the anesthesia or the breathing tube placed in your throat during surgery. If this causes discomfort, gargle with warm salt water. The discomfort should disappear within 24 hours.  If you had a scopolamine patch placed behind your ear for the management of post- operative nausea and/or vomiting:  1. The medication in the patch is effective for 72 hours, after which it should be removed.  Wrap patch in a tissue and discard in the trash. Wash hands thoroughly with soap and water. 2. You may remove the patch earlier than 72 hours if you experience unpleasant side effects which may include dry mouth, dizziness or visual disturbances. 3. Avoid touching the patch. Wash your hands with soap and water after contact with the patch.    --------  The patient may resume all her previous activities and diet.  She may use a nasal drip pad as needed for nasal drainage or bleeding.

## 2021-03-18 NOTE — H&P (Signed)
Cc:  Left nasal inverting papilloma  HPI:  The patient is a 40 year old female who presents today complaining of increasing nasal congestion and left nasal crusting.  The patient has a history of nasal septal deviation, turbinate hypertrophy and left nasal papilloma.  She underwent surgical resection of her left nasal lesion in 07/2020.  She also underwent septoplasty and turbinate reduction surgery to improve her nasal passageways.  According to the patient, she has noted increasing crusting around the base of her left nasal cavity over the past few weeks.  She has also reported increasing nasal congestion.  She is using Flonase nasal spray daily.  She denies any facial pain, purulent drainage, or visual change.    Exam: General: Communicates without difficulty, well nourished, no acute distress. Head: Normocephalic, no evidence injury, no tenderness, facial buttresses intact without stepoff. Face/sinus: No tenderness to palpation and percussion. Facial movement is normal and symmetric. Eyes: PERRL, EOMI. No scleral icterus, conjunctivae clear. Neuro: CN II exam reveals vision grossly intact. No nystagmus at any point of gaze. Ears: Auricles well formed without lesions. Ear canals are intact without mass or lesion. No erythema or edema is appreciated. The TMs are intact without fluid. Nose: External evaluation reveals normal support and skin without lesions. Dorsum is intact. Anterior rhinoscopy reveals healed nasal mucosa. Mild nasal congestion is noted. Oral:  Oral cavity and oropharynx are intact, symmetric, without erythema or edema. Mucosa is moist without lesions. Neck: Full range of motion without pain. There is no significant lymphadenopathy. No masses palpable. Thyroid bed within normal limits to palpation. Parotid glands and submandibular glands equal bilaterally without mass. Trachea is midline. Neuro:  CN 2-12 grossly intact. Gait normal. Vestibular: No nystagmus at any point of gaze. The  cerebellar examination is unremarkable. A flexible scope was inserted into the right nasal cavity. Endoscopy of the interior nasal cavity, superior, inferior, and middle meatus was performed. The sphenoid-ethmoid recess was examined. Mildly edematous mucosa was noted. No polyp, mass, or lesion was appreciated. Nasal septum is well healed and nasal passageways were patent bilaterally. Olfactory cleft was clear. Nasopharynx was clear. Turbinates were well healed. The procedure was repeated on the contralateral side with a small amount of crusting and polypoid tissue at the base of the left nasal cavity. The patient tolerated the procedure well.   Assessment: 1.  A small amount of polypoid tissue is noted at the base of the left nasal cavity.  It is the same location of her previous inverted papilloma.   2.  The patient's septum and turbinates are well healed.  Mild nasal mucosal congestion is noted today, likely secondary to allergic/chronic rhinitis.   Plan: 1.  The nasal endoscopy findings are reviewed.  2.  Continue with Flonase nasal spray daily.  3.  In light of the papilloma recurrence, the patient will benefit from undergoing repeat surgical resection in the operating room.  4.  The possible malignant degenerative potential of the inverted papilloma is discussed with the patient.  5.  The patient would like to proceed with the procedure.

## 2021-03-18 NOTE — Anesthesia Preprocedure Evaluation (Signed)
Anesthesia Evaluation  Patient identified by MRN, date of birth, ID band Patient awake    Reviewed: Allergy & Precautions, NPO status , Patient's Chart, lab work & pertinent test results  Airway Mallampati: II  TM Distance: >3 FB Neck ROM: Full    Dental  (+) Teeth Intact   Pulmonary neg pulmonary ROS, former smoker,    Pulmonary exam normal        Cardiovascular negative cardio ROS   Rhythm:Regular Rate:Normal     Neuro/Psych negative neurological ROS  negative psych ROS   GI/Hepatic negative GI ROS, Neg liver ROS,   Endo/Other  negative endocrine ROS  Renal/GU negative Renal ROS  negative genitourinary   Musculoskeletal negative musculoskeletal ROS (+)   Abdominal (+)  Abdomen: soft. Bowel sounds: normal.  Peds  Hematology negative hematology ROS (+)   Anesthesia Other Findings Nasal hypertrophy   Reproductive/Obstetrics                            Anesthesia Physical Anesthesia Plan  ASA: II  Anesthesia Plan: General   Post-op Pain Management:    Induction: Intravenous  PONV Risk Score and Plan: 3 and Ondansetron, Dexamethasone, Midazolam and Treatment may vary due to age or medical condition  Airway Management Planned: Mask and LMA  Additional Equipment: None  Intra-op Plan:   Post-operative Plan: Extubation in OR  Informed Consent: I have reviewed the patients History and Physical, chart, labs and discussed the procedure including the risks, benefits and alternatives for the proposed anesthesia with the patient or authorized representative who has indicated his/her understanding and acceptance.     Dental advisory given  Plan Discussed with: CRNA  Anesthesia Plan Comments: (Lab Results      Component                Value               Date                      PREGTESTUR               NEGATIVE            03/18/2021                HCG                      <5.0                 03/15/2018          )       Anesthesia Quick Evaluation

## 2021-03-18 NOTE — Anesthesia Postprocedure Evaluation (Signed)
Anesthesia Post Note  Patient: Donna Mcdowell  Procedure(s) Performed: EXCISION LEFT NASAL INVERTED PAPILLOMAS (Left Nose)     Patient location during evaluation: PACU Anesthesia Type: General Level of consciousness: awake and alert Pain management: pain level controlled Vital Signs Assessment: post-procedure vital signs reviewed and stable Respiratory status: spontaneous breathing, nonlabored ventilation, respiratory function stable and patient connected to nasal cannula oxygen Cardiovascular status: blood pressure returned to baseline and stable Postop Assessment: no apparent nausea or vomiting Anesthetic complications: no   No complications documented.  Last Vitals:  Vitals:   03/18/21 0911 03/18/21 0921  BP:  114/77  Pulse: 74 67  Resp: 14 16  Temp:  36.7 C  SpO2: 95% 94%    Last Pain:  Vitals:   03/18/21 0921  TempSrc:   PainSc: 0-No pain                 March Rummage [place]

## 2021-03-18 NOTE — Anesthesia Procedure Notes (Signed)
Procedure Name: LMA Insertion Date/Time: 03/18/2021 8:07 AM Performed by: Lieutenant Diego, CRNA Pre-anesthesia Checklist: Patient identified, Emergency Drugs available, Suction available and Patient being monitored Patient Re-evaluated:Patient Re-evaluated prior to induction Oxygen Delivery Method: Circle system utilized Preoxygenation: Pre-oxygenation with 100% oxygen Induction Type: IV induction Ventilation: Mask ventilation without difficulty LMA: LMA flexible inserted LMA Size: 4.0 Number of attempts: 1 Placement Confirmation: positive ETCO2 and breath sounds checked- equal and bilateral Tube secured with: Tape Dental Injury: Teeth and Oropharynx as per pre-operative assessment

## 2021-03-18 NOTE — Op Note (Signed)
DATE OF PROCEDURE: 03/18/2021  OPERATIVE REPORT   SURGEON: Leta Baptist, MD  PREOPERATIVE DIAGNOSIS: Left nasal inverted papilloma  POSTOPERATIVE DIAGNOSIS: Left nasal inverted papilloma  PROCEDURES PERFORMED: 1.  Excision of left nasal inverted papilloma.  ANESTHESIA: General laryngeal mask anesthesia.  COMPLICATIONS: None.  ESTIMATED BLOOD LOSS: Minimal.  INDICATION FOR PROCEDURE:  Donna Mcdowell is a 40 y.o. female with a history of a left nasal papilloma.  She underwent surgical resection of her left nasal lesion in 07/2020. The pathology was consistent with inverted papilloma.  Over the last few months, the patient has noted increasing crusting around the base of the left nasal cavity.  On examination, the patient was noted to have papilloma recurrence at the previous surgical site.  Based on the above findings, the decision was made for the patient to undergo the above-stated procedure. The risks, benefits, alternatives, and details of the procedures were discussed with the mother. Questions were invited and answered. Informed consent was obtained.  DESCRIPTION OF PROCEDURE: The patient was taken to the operating room and placed supine on the operating table. General laryngeal mask anesthesia was induced by the anesthesiologist.  The patient was positioned and prepped and draped in the standard fashion for nasal surgery.  Examination of the left nasal cavity revealed papilloma recurrence at the base of the left nasal cavity.  In addition, several papilloma-like lesion was noted on the lateral aspect of the left nasal alar.  1% lidocaine with 1-100,000 epinephrine was infiltrated around the nasal lesions.  The nasal lesions were then excised with a 15 blade.  The specimens were sent to the pathology department for permanent histologic identification.  Extensive cauterization was performed on the underlying nasal bone and soft tissue.  The left nasal cavity was then  packed with Xeroform dressing.  The care of the patient was turned over to the anesthesiologist. The patient was awakened from anesthesia without difficulty. She was extubated and transferred to the recovery room in good condition.  OPERATIVE FINDINGS:Left nasal inverted papillomas.  SPECIMEN:Left nasal specimens.  FOLLOWUP CARE: The patient will be discharged home once she is awake and alert. She will follow up in my office in 1 week.

## 2021-03-18 NOTE — Transfer of Care (Signed)
Immediate Anesthesia Transfer of Care Note  Patient: Donna Mcdowell  Procedure(s) Performed: EXCISION INVERTED LEFT NASAL PAPILLOMA (Left Nose)  Patient Location: PACU  Anesthesia Type:General  Level of Consciousness: drowsy  Airway & Oxygen Therapy: Patient Spontanous Breathing and Patient connected to face mask oxygen  Post-op Assessment: Report given to RN and Post -op Vital signs reviewed and stable  Post vital signs: Reviewed and stable  Last Vitals:  Vitals Value Taken Time  BP    Temp    Pulse 65 03/18/21 0837  Resp 17 03/18/21 0837  SpO2 98 % 03/18/21 0837  Vitals shown include unvalidated device data.  Last Pain:  Vitals:   03/18/21 0710  TempSrc: Oral  PainSc: 0-No pain         Complications: No complications documented.

## 2021-03-19 LAB — SURGICAL PATHOLOGY

## 2021-03-20 ENCOUNTER — Encounter (HOSPITAL_BASED_OUTPATIENT_CLINIC_OR_DEPARTMENT_OTHER): Payer: Self-pay | Admitting: Otolaryngology

## 2021-03-25 DIAGNOSIS — M545 Low back pain, unspecified: Secondary | ICD-10-CM | POA: Diagnosis not present

## 2021-04-11 DIAGNOSIS — M9905 Segmental and somatic dysfunction of pelvic region: Secondary | ICD-10-CM | POA: Diagnosis not present

## 2021-04-11 DIAGNOSIS — M9901 Segmental and somatic dysfunction of cervical region: Secondary | ICD-10-CM | POA: Diagnosis not present

## 2021-04-11 DIAGNOSIS — M9903 Segmental and somatic dysfunction of lumbar region: Secondary | ICD-10-CM | POA: Diagnosis not present

## 2021-04-11 DIAGNOSIS — M9902 Segmental and somatic dysfunction of thoracic region: Secondary | ICD-10-CM | POA: Diagnosis not present

## 2021-04-14 DIAGNOSIS — M545 Low back pain, unspecified: Secondary | ICD-10-CM | POA: Diagnosis not present

## 2021-05-01 DIAGNOSIS — M545 Low back pain, unspecified: Secondary | ICD-10-CM | POA: Diagnosis not present

## 2021-05-09 DIAGNOSIS — M9901 Segmental and somatic dysfunction of cervical region: Secondary | ICD-10-CM | POA: Diagnosis not present

## 2021-05-09 DIAGNOSIS — M9903 Segmental and somatic dysfunction of lumbar region: Secondary | ICD-10-CM | POA: Diagnosis not present

## 2021-05-09 DIAGNOSIS — M9902 Segmental and somatic dysfunction of thoracic region: Secondary | ICD-10-CM | POA: Diagnosis not present

## 2021-05-09 DIAGNOSIS — M9905 Segmental and somatic dysfunction of pelvic region: Secondary | ICD-10-CM | POA: Diagnosis not present

## 2021-08-01 DIAGNOSIS — J310 Chronic rhinitis: Secondary | ICD-10-CM | POA: Diagnosis not present

## 2021-08-01 DIAGNOSIS — J338 Other polyp of sinus: Secondary | ICD-10-CM | POA: Diagnosis not present

## 2021-09-23 ENCOUNTER — Ambulatory Visit
Admission: EM | Admit: 2021-09-23 | Discharge: 2021-09-23 | Disposition: A | Discharge status: Home / Self Care | Payer: BC Managed Care – PPO | Attending: Internal Medicine | Admitting: Internal Medicine

## 2021-09-23 ENCOUNTER — Other Ambulatory Visit: Payer: Self-pay

## 2021-09-23 DIAGNOSIS — U071 COVID-19: Secondary | ICD-10-CM | POA: Insufficient documentation

## 2021-09-23 DIAGNOSIS — J029 Acute pharyngitis, unspecified: Secondary | ICD-10-CM | POA: Diagnosis not present

## 2021-09-23 LAB — POCT RAPID STREP A (OFFICE): Rapid Strep A Screen: NEGATIVE

## 2021-09-23 MED ORDER — BENZONATATE 100 MG PO CAPS: 100.0000 mg | ORAL_CAPSULE | Freq: Three times a day (TID) | ORAL | 0 refills | Status: DC | PRN

## 2021-09-23 NOTE — Discharge Instructions (Signed)
Rapid strep was negative.  Throat culture is pending.  We will call if it is positive.  You have been prescribed a medication to help alleviate cough.  Please continue to monitor fevers.

## 2021-09-23 NOTE — ED Triage Notes (Signed)
Four day h/o congestion and intermittent HA, 3 day h/o fatigue and achiness. Onset yesterday of "burning" sore throat. Has been taking theraflu, tylenol, ibuprofen and mucinex w/o relief. Confirms dysphagia, indigestion and cough. No v/d.  Positive at home covid test. Mom and son both tested positive for covid.
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
## 2021-09-25 LAB — CULTURE, GROUP A STREP (THRC)

## 2021-10-27 NOTE — Progress Notes (Signed)
41 y.o. G0P0 Married White or Caucasian Not Hispanic or Latino female here for annual exam.   Period Duration (Days): 5 Period Pattern: Regular Menstrual Flow: Moderate Menstrual Control: Tampon Dysmenorrhea: (!) Mild Dysmenorrhea Symptoms: Cramping  Patient's last menstrual period was 10/23/2021 (exact date).          Sexually active: Yes.    The current method of family planning is none, female partner.    Exercising: Yes.     30-45 minutes daily Smoker:  no  Health Maintenance: Pap:  10-23-20 Neg:Neg HR HPV History of abnormal Pap:  Yes, LEEP for CIN III in 5/14 MMG:  07/19/19 Bi-rads 1 neg  BMD:   none  Colonoscopy: none  TDaP:  09/04/14  Gardasil: never    reports that she has quit smoking. Her smoking use included cigarettes. She has a 19.00 pack-year smoking history. She has never used smokeless tobacco. She reports current alcohol use. She reports that she does not use drugs. Just occasional ETOH. 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 Wife is a Corporate treasurer. Son Grayce Sessions is 51 months old (her genetic son, wife carried).   Past Medical History:  Diagnosis Date   Carbuncle of face    forehead   Deviated septum 07/2018   Nasal mass 07/2018   Nasal turbinate hypertrophy 07/2018   bilateral    Past Surgical History:  Procedure Laterality Date   EXCISION NASAL MASS Left 08/30/2018   Procedure: EXCISION OF NASAL MASS;  Surgeon: Leta Baptist, MD;  Location: [facility];  Service: ENT;  Laterality: Left;   EXCISION NASAL MASS Left 03/18/2021   Procedure: EXCISION LEFT NASAL INVERTED PAPILLOMAS;  Surgeon: Leta Baptist, MD;  Location: [facility];  Service: ENT;  Laterality: Left;   FINGER TENDON REPAIR Right 06/23/2010   long finger   INCISION AND DRAINAGE BREAST ABSCESS Left 06/09/2011   NASAL SEPTOPLASTY W/ TURBINOPLASTY Bilateral 08/30/2018   Procedure: NASAL SEPTOPLASTY WITH TURBINATE REDUCTION;   Surgeon: Leta Baptist, MD;  Location: [facility];  Service: ENT;  Laterality: Bilateral;   TRIGGER FINGER RELEASE Right 06/23/2010   long finger   WISDOM TOOTH EXTRACTION      Current Outpatient Medications  Medication Sig Dispense Refill   acetaminophen (TYLENOL) 500 MG tablet Take 1,000 mg by mouth every 6 (six) hours as needed.     Fexofenadine-Pseudoephedrine (ALLEGRA-D 24 HOUR PO) Take by mouth.     fluticasone (FLONASE) 50 MCG/ACT nasal spray Place into both nostrils daily.     Ibuprofen (ADVIL PO) Take by mouth as needed.     No current facility-administered medications for this visit.    Family History  Problem Relation Age of Onset   Breast cancer Mother 67   Cancer Mother        Breast   Osteoarthritis Maternal Grandmother    Cancer Maternal Grandmother        Lung   Osteoarthritis Maternal Grandfather    Heart failure Maternal Grandfather     Review of Systems  Constitutional: Negative.   HENT: Negative.    Eyes: Negative.   Respiratory: Negative.    Cardiovascular: Negative.   Gastrointestinal: Negative.   Endocrine: Negative.   Genitourinary: Negative.   Musculoskeletal: Negative.   Skin: Negative.   Allergic/Immunologic: Negative.   Neurological: Negative.   Hematological: Negative.   Psychiatric/Behavioral: Negative.     Exam:   BP 100/64  Pulse 68    Resp 16    Ht 5' 3.75" (1.619 m)    Wt 129 lb (58.5 kg)    LMP 10/23/2021 (Exact Date)    BMI 22.32 kg/m   Weight change: @WEIGHTCHANGE @ Height:   Height: 5' 3.75" (161.9 cm)  Ht Readings from Last 3 Encounters:  10/29/21 5' 3.75" (1.619 m)  03/18/21 5' 3.5" (1.613 m)  10/23/20 5' 3.5" (1.613 m)    General appearance: alert, cooperative and appears stated age Head: Normocephalic, without obvious abnormality, atraumatic Neck: no adenopathy, supple, symmetrical, trachea midline and thyroid normal to inspection and palpation Lungs: clear to auscultation bilaterally Cardiovascular: regular  rate and rhythm Breasts: normal appearance, no masses or tenderness Abdomen: soft, non-tender; non distended,  no masses,  no organomegaly Extremities: extremities normal, atraumatic, no cyanosis or edema Skin: Skin color, texture, turgor normal. No rashes or lesions Lymph nodes: Cervical, supraclavicular, and axillary nodes normal. No abnormal inguinal nodes palpated Neurologic: Grossly normal   Pelvic: External genitalia:  no lesions              Urethra:  normal appearing urethra with no masses, tenderness or lesions              Bartholins and Skenes: normal                 Vagina: normal appearing vagina with normal color and discharge, no lesions              Cervix: no lesions               Bimanual Exam:  Uterus:  normal size, contour, position, consistency, mobility, non-tender              Adnexa: no mass, fullness, tenderness               Rectovaginal: declined  Caryn Bee, CMA chaperoned for the exam.  1. Well woman exam No pap this year Mammogram due, she will schedule Discussed breast self exam Discussed calcium and vit D intake  2. Vitamin D deficiency Not currently on vit d - VITAMIN D 25 Hydroxy (Vit-D Deficiency, Fractures)  3. Laboratory exam ordered as part of routine general medical examination - CBC - Comprehensive metabolic panel - Lipid panel  4. Low HDL (under 40) - Lipid panel  5. Family history of breast cancer She will set up her mammogram

## 2021-10-29 ENCOUNTER — Encounter: Payer: Self-pay | Admitting: Obstetrics and Gynecology

## 2021-10-29 ENCOUNTER — Other Ambulatory Visit: Payer: Self-pay

## 2021-10-29 ENCOUNTER — Ambulatory Visit (INDEPENDENT_AMBULATORY_CARE_PROVIDER_SITE_OTHER): Payer: BC Managed Care – PPO | Admitting: Obstetrics and Gynecology

## 2021-10-29 VITALS — BP 100/64 | HR 68 | Resp 16 | Ht 63.75 in | Wt 129.0 lb

## 2021-10-29 DIAGNOSIS — E559 Vitamin D deficiency, unspecified: Secondary | ICD-10-CM | POA: Diagnosis not present

## 2021-10-29 DIAGNOSIS — Z01419 Encounter for gynecological examination (general) (routine) without abnormal findings: Secondary | ICD-10-CM

## 2021-10-29 DIAGNOSIS — E786 Lipoprotein deficiency: Secondary | ICD-10-CM

## 2021-10-29 DIAGNOSIS — Z0000 Encounter for general adult medical examination without abnormal findings: Secondary | ICD-10-CM | POA: Diagnosis not present

## 2021-10-29 DIAGNOSIS — Z803 Family history of malignant neoplasm of breast: Secondary | ICD-10-CM

## 2021-10-29 NOTE — Patient Instructions (Signed)

## 2021-10-30 LAB — COMPREHENSIVE METABOLIC PANEL
AG Ratio: 2.1 (calc) (ref 1.0–2.5)
ALT: 11 U/L (ref 6–29)
AST: 11 U/L (ref 10–30)
Albumin: 4.7 g/dL (ref 3.6–5.1)
Alkaline phosphatase (APISO): 50 U/L (ref 31–125)
BUN: 12 mg/dL (ref 7–25)
CO2: 29 mmol/L (ref 20–32)
Calcium: 9.8 mg/dL (ref 8.6–10.2)
Chloride: 104 mmol/L (ref 98–110)
Creat: 0.59 mg/dL (ref 0.50–0.97)
Globulin: 2.2 g/dL (calc) (ref 1.9–3.7)
Glucose, Bld: 89 mg/dL (ref 65–99)
Potassium: 4.2 mmol/L (ref 3.5–5.3)
Sodium: 139 mmol/L (ref 135–146)
Total Bilirubin: 0.5 mg/dL (ref 0.2–1.2)
Total Protein: 6.9 g/dL (ref 6.1–8.1)

## 2021-10-30 LAB — LIPID PANEL
Cholesterol: 163 mg/dL (ref <200)
HDL: 50 mg/dL (ref >=50)
LDL Cholesterol (Calc): 93 mg/dL (calc)
Non-HDL Cholesterol (Calc): 113 mg/dL (calc) (ref <130)
Total CHOL/HDL Ratio: 3.3 (calc) (ref <5.0)
Triglycerides: 104 mg/dL (ref <150)

## 2021-10-30 LAB — CBC
HCT: 38.9 % (ref 35.0–45.0)
Hemoglobin: 13.2 g/dL (ref 11.7–15.5)
MCH: 32.1 pg (ref 27.0–33.0)
MCHC: 33.9 g/dL (ref 32.0–36.0)
MCV: 94.6 fL (ref 80.0–100.0)
MPV: 11.7 fL (ref 7.5–12.5)
Platelets: 199 10*3/uL (ref 140–400)
RBC: 4.11 10*6/uL (ref 3.80–5.10)
RDW: 12.3 % (ref 11.0–15.0)
WBC: 4.8 10*3/uL (ref 3.8–10.8)

## 2021-10-30 LAB — VITAMIN D 25 HYDROXY (VIT D DEFICIENCY, FRACTURES): Vit D, 25-Hydroxy: 21 ng/mL — ABNORMAL LOW (ref 30–100)

## 2021-11-20 ENCOUNTER — Encounter: Payer: Self-pay | Admitting: Obstetrics and Gynecology

## 2021-11-20 DIAGNOSIS — Z803 Family history of malignant neoplasm of breast: Secondary | ICD-10-CM

## 2021-11-27 ENCOUNTER — Telehealth: Payer: Self-pay | Admitting: *Deleted

## 2021-11-27 DIAGNOSIS — Z803 Family history of malignant neoplasm of breast: Secondary | ICD-10-CM

## 2021-11-27 NOTE — Telephone Encounter (Signed)
Please make sure the Radiology department is aware that her mother had breast cancer at 9. If they are unwilling to do a screening mammogram, then I would recommend that Donna Mcdowell, go to the genetics center to calculate her risk. Thanks

## 2021-11-27 NOTE — Telephone Encounter (Signed)
I called breast center and was told patient still cannot have imaging done based on information below. Patient would like to proceed with referral. Referral placed at [facility] will call to schedule.

## 2021-11-27 NOTE — Telephone Encounter (Signed)
Patient called stating the breast center won't schedule her screening mammogram due to family history of breast cancer. I called the breast center and was told that per last mammogram on 07/2021 the radiologist recommendations was to repeat "mammogram screening at age 41 years old". If the patient is having a breast problem they can see her for diagnostic imaging, otherwise her next mammogram will be at age 36. I wanted to relay this informed to you before I spoke with the patient again. Please advise

## 2021-11-28 ENCOUNTER — Telehealth: Payer: Self-pay | Admitting: Genetic Counselor

## 2021-11-28 NOTE — Telephone Encounter (Signed)
Scheduled appt per 2/16 referral. Pt is aware of appt date and time. Pt is aware to arrive 15 mins prior to appt time and to bring and updated insurance card. Pt is aware of appt location.   °

## 2021-12-01 NOTE — Telephone Encounter (Signed)
Patient scheduled on 12/25/21 for genetic counseling.

## 2021-12-22 DIAGNOSIS — M9906 Segmental and somatic dysfunction of lower extremity: Secondary | ICD-10-CM | POA: Diagnosis not present

## 2021-12-22 DIAGNOSIS — M791 Myalgia, unspecified site: Secondary | ICD-10-CM | POA: Diagnosis not present

## 2021-12-22 DIAGNOSIS — M9902 Segmental and somatic dysfunction of thoracic region: Secondary | ICD-10-CM | POA: Diagnosis not present

## 2021-12-22 DIAGNOSIS — M9903 Segmental and somatic dysfunction of lumbar region: Secondary | ICD-10-CM | POA: Diagnosis not present

## 2021-12-22 DIAGNOSIS — M79674 Pain in right toe(s): Secondary | ICD-10-CM | POA: Diagnosis not present

## 2021-12-22 DIAGNOSIS — M9905 Segmental and somatic dysfunction of pelvic region: Secondary | ICD-10-CM | POA: Diagnosis not present

## 2021-12-25 ENCOUNTER — Inpatient Hospital Stay: Payer: BC Managed Care – PPO

## 2021-12-25 ENCOUNTER — Inpatient Hospital Stay: Payer: BC Managed Care – PPO | Attending: Obstetrics and Gynecology | Admitting: Genetic Counselor

## 2021-12-25 ENCOUNTER — Other Ambulatory Visit: Payer: Self-pay

## 2021-12-25 DIAGNOSIS — Z803 Family history of malignant neoplasm of breast: Secondary | ICD-10-CM

## 2021-12-25 LAB — GENETIC SCREENING ORDER

## 2021-12-26 ENCOUNTER — Encounter: Payer: Self-pay | Admitting: Genetic Counselor

## 2021-12-26 DIAGNOSIS — Z803 Family history of malignant neoplasm of breast: Secondary | ICD-10-CM

## 2021-12-26 HISTORY — DX: Family history of malignant neoplasm of breast: Z80.3

## 2021-12-26 NOTE — Progress Notes (Signed)
REFERRING PROVIDER: ?Salvadore Dom, MD ?[address] ?[place],  [place] [address] ? ?PRIMARY PROVIDER:  ?None listed ? ?PRIMARY REASON FOR VISIT:  ?1. Family history of breast cancer   ? ? ?HISTORY OF PRESENT ILLNESS:   ?Donna Mcdowell, a 41 y.o. female, was seen for a [facility] cancer genetics consultation at the request of Dr. Talbert Nan due to a family history of breast cancer.  Ms. Ryans presents to clinic today to discuss the possibility of a hereditary predisposition to cancer, to discuss genetic testing, and to further clarify her future cancer risks, as well as potential cancer risks for family members.  ? ? ? Ms. Vanzandt is a 41 y.o. female with no personal history of cancer.   ? ?CANCER HISTORY:  ?Oncology History  ? No history exists.  ? ? ?RISK FACTORS:  ?Mammogram within the last year: most recent in 2020 ?Number of breast biopsies: 0. ?Colonoscopy: no; not examined. ?Hysterectomy: no.  ?Ovaries intact: yes.  ?Up to date with pelvic exams: yes. ?Menarche was at age 54.  ?Nulliparous.  ?Menopausal status: premenopausal.  ?OCP use for approximately 0 years.  ?HRT use: 0 years. ? ?Past Medical History:  ?Diagnosis Date  ? Carbuncle of face   ? forehead  ? Deviated septum 07/2018  ? Family history of breast cancer 12/26/2021  ? Nasal mass 07/2018  ? Nasal turbinate hypertrophy 07/2018  ? bilateral  ? ? ?Past Surgical History:  ?Procedure Laterality Date  ? EXCISION NASAL MASS Left 08/30/2018  ? Procedure: EXCISION OF NASAL MASS;  Surgeon: Leta Baptist, MD;  Location: [facility];  Service: ENT;  Laterality: Left;  ? EXCISION NASAL MASS Left 03/18/2021  ? Procedure: EXCISION LEFT NASAL INVERTED PAPILLOMAS;  Surgeon: Leta Baptist, MD;  Location: [facility];  Service: ENT;  Laterality: Left;  ? FINGER TENDON REPAIR Right 06/23/2010  ? long finger  ? INCISION AND DRAINAGE BREAST ABSCESS Left 06/09/2011  ? NASAL SEPTOPLASTY W/ TURBINOPLASTY Bilateral 08/30/2018  ? Procedure: NASAL  SEPTOPLASTY WITH TURBINATE REDUCTION;  Surgeon: Leta Baptist, MD;  Location: [facility];  Service: ENT;  Laterality: Bilateral;  ? TRIGGER FINGER RELEASE Right 06/23/2010  ? long finger  ? WISDOM TOOTH EXTRACTION    ? ?  ? ?FAMILY HISTORY:  ?We obtained a detailed, 4-generation family history.  Significant diagnoses are listed below: ?Family History  ?Problem Relation Age of Onset  ? Breast cancer Mother 28  ? Colon polyps Mother   ?     unknown number  ? Lung cancer Maternal Grandmother   ?     dx 72s  ? ? ?Ms. Jean had limited information about paternal family history. Ms. Dobosz is unaware of previous family history of genetic testing for hereditary cancer risks. The patient's affected relatives were unavailable for genetic testing at this point in time. There is no reported Ashkenazi Jewish ancestry. There is no known consanguinity. ? ?GENETIC COUNSELING ASSESSMENT: Ms. Coven is a 41 y.o. female with a family history of cancer which is somewhat suggestive of a hereditary cancer syndrome and predisposition to cancer given her mother's age of diagnosis. We, therefore, discussed and recommended the following at today's visit.  ? ?DISCUSSION: We discussed that 5 - 10% of cancer is hereditary, with most cases of hereditary breast cancer associated with mutations in BRCA1/2.  There are other genes that can be associated with hereditary breast cancer syndromes.  We discussed that testing is beneficial for  several reasons, including knowing about other cancer risks, identifying potential screening and risk-reduction options that may be appropriate, and to understanding if other family members could be at risk for cancer and allowing them to undergo genetic testing. ? ?We reviewed the characteristics, features and inheritance patterns of hereditary cancer syndromes. We also discussed genetic testing, including the appropriate family members to test, the process of testing, insurance coverage and  turn-around-time for results. We discussed the implications of a negative, positive, carrier and/or variant of uncertain significant result. We discussed that negative results would be uninformative given that Ms. Wanat does not have a personal history of cancer. We recommended Ms. Ridolfi pursue genetic testing for a panel that contains genes associated with breast and other cancer types. ? ?The CancerNext-Expanded gene panel offered by [facility] and includes sequencing, rearrangement, and RNA analysis for the following 77 genes: AIP, ALK, APC, ATM, AXIN2, BAP1, BARD1, BLM, BMPR1A, BRCA1, BRCA2, BRIP1, CDC73, CDH1, CDK4, CDKN1B, CDKN2A, CHEK2, CTNNA1, DICER1, FANCC, FH, FLCN, GALNT12, KIF1B, LZTR1, MAX, MEN1, MET, MLH1, MSH2, MSH3, MSH6, MUTYH, NBN, NF1, NF2, NTHL1, PALB2, PHOX2B, PMS2, POT1, PRKAR1A, PTCH1, PTEN, RAD51C, RAD51D, RB1, RECQL, RET, SDHA, SDHAF2, SDHB, SDHC, SDHD, SMAD4, SMARCA4, SMARCB1, SMARCE1, STK11, SUFU, TMEM127, TP53, TSC1, TSC2, VHL and XRCC2 (sequencing and deletion/duplication); EGFR, EGLN1, HOXB13, KIT, MITF, PDGFRA, POLD1, and POLE (sequencing only); EPCAM and GREM1 (deletion/duplication only).  ? ?Based on Ms. Weingart's family history of cancer, she meets medical criteria for genetic testing. Despite that she meets criteria, she may still have an out of pocket cost. We discussed that if her out of pocket cost for testing is over $100, the laboratory should contact her to discuss self-pay options and/or patient pay assistance programs.  ? ?We discussed that some people do not want to undergo genetic testing due to fear of genetic discrimination.  A federal law called the Genetic Information Non-Discrimination Act (GINA) of 2008 helps protect individuals against genetic discrimination based on their genetic test results.  It impacts both health insurance and employment.  With health insurance, it protects against increased premiums, being kicked off insurance or being forced to take a  test in order to be insured.  For employment it protects against hiring, firing and promoting decisions based on genetic test results.  GINA does not apply to those in the [employer], those who work for companies with less than 15 employees, and new life insurance or long-term disability insurance policies.  Health status due to a cancer diagnosis is not protected under GINA. ? ?PLAN: After considering the risks, benefits, and limitations, Ms. Bennick provided informed consent to pursue genetic testing and the blood sample was sent to [facility] for analysis of the CancerNext-Expanded +RNAinsight Panel. Results should be available within approximately 3 weeks' time, at which point they will be disclosed by telephone to Ms. Dermody, as will any additional recommendations warranted by these results. Ms. Spagnolo will receive a summary of her genetic counseling visit and a copy of her results once available. This information will also be available in Epic.  ? ?Lastly, we encouraged Ms. Venti to remain in contact with cancer genetics annually so that we can continuously update the family history and inform her of any changes in cancer genetics and testing that may be of benefit for this family.  ? ?Ms. Evon's questions were answered to her satisfaction today. Our contact information was provided should additional questions or concerns arise. Thank you for the referral and allowing [place] to share in  the care of your patient.  ? ?Khriz Liddy M. Joette Catching, [place], [place] ?Genetic Counselor ?[email or web address] ?(P) [phone]  ? ?The patient was seen for a total of 30 minutes in face-to-face genetic counseling.  The patient was seen alone.  Drs. Lindi Adie and/or Burr Medico were available to discuss this case as needed.  ?_______________________________________________________________________ ?For Office Staff:  ?Number of people involved in session: 1 ?Was an Intern/ student involved with case: no ? ? ?

## 2022-01-01 DIAGNOSIS — M9903 Segmental and somatic dysfunction of lumbar region: Secondary | ICD-10-CM | POA: Diagnosis not present

## 2022-01-01 DIAGNOSIS — M9906 Segmental and somatic dysfunction of lower extremity: Secondary | ICD-10-CM | POA: Diagnosis not present

## 2022-01-01 DIAGNOSIS — M79674 Pain in right toe(s): Secondary | ICD-10-CM | POA: Diagnosis not present

## 2022-01-01 DIAGNOSIS — M9905 Segmental and somatic dysfunction of pelvic region: Secondary | ICD-10-CM | POA: Diagnosis not present

## 2022-01-02 ENCOUNTER — Ambulatory Visit
Admission: RE | Admit: 2022-01-02 | Discharge: 2022-01-02 | Disposition: A | Discharge status: Home / Self Care | Payer: BC Managed Care – PPO | Source: Ambulatory Visit | Attending: Obstetrics and Gynecology | Admitting: Obstetrics and Gynecology

## 2022-01-02 DIAGNOSIS — Z1231 Encounter for screening mammogram for malignant neoplasm of breast: Secondary | ICD-10-CM | POA: Diagnosis not present

## 2022-01-02 DIAGNOSIS — Z803 Family history of malignant neoplasm of breast: Secondary | ICD-10-CM

## 2022-01-06 DIAGNOSIS — M79674 Pain in right toe(s): Secondary | ICD-10-CM | POA: Diagnosis not present

## 2022-01-06 DIAGNOSIS — M9905 Segmental and somatic dysfunction of pelvic region: Secondary | ICD-10-CM | POA: Diagnosis not present

## 2022-01-06 DIAGNOSIS — M9906 Segmental and somatic dysfunction of lower extremity: Secondary | ICD-10-CM | POA: Diagnosis not present

## 2022-01-06 DIAGNOSIS — M9903 Segmental and somatic dysfunction of lumbar region: Secondary | ICD-10-CM | POA: Diagnosis not present

## 2022-01-13 DIAGNOSIS — M9906 Segmental and somatic dysfunction of lower extremity: Secondary | ICD-10-CM | POA: Diagnosis not present

## 2022-01-13 DIAGNOSIS — M9905 Segmental and somatic dysfunction of pelvic region: Secondary | ICD-10-CM | POA: Diagnosis not present

## 2022-01-13 DIAGNOSIS — M9903 Segmental and somatic dysfunction of lumbar region: Secondary | ICD-10-CM | POA: Diagnosis not present

## 2022-01-13 DIAGNOSIS — M79674 Pain in right toe(s): Secondary | ICD-10-CM | POA: Diagnosis not present

## 2022-01-15 ENCOUNTER — Telehealth: Payer: Self-pay | Admitting: Genetic Counselor

## 2022-01-15 ENCOUNTER — Encounter: Payer: Self-pay | Admitting: Genetic Counselor

## 2022-01-15 DIAGNOSIS — Z1379 Encounter for other screening for genetic and chromosomal anomalies: Secondary | ICD-10-CM | POA: Insufficient documentation

## 2022-01-15 NOTE — Telephone Encounter (Signed)
Contacted patient in attempt to disclose results of genetic testing.  LVM with contact information requesting a call back.  

## 2022-01-23 ENCOUNTER — Ambulatory Visit: Payer: Self-pay | Admitting: Genetic Counselor

## 2022-01-23 ENCOUNTER — Telehealth: Payer: Self-pay | Admitting: Genetic Counselor

## 2022-01-23 ENCOUNTER — Telehealth: Payer: Self-pay | Admitting: Obstetrics and Gynecology

## 2022-01-23 DIAGNOSIS — Z803 Family history of malignant neoplasm of breast: Secondary | ICD-10-CM

## 2022-01-23 DIAGNOSIS — Z1379 Encounter for other screening for genetic and chromosomal anomalies: Secondary | ICD-10-CM

## 2022-01-23 NOTE — Telephone Encounter (Signed)
Mychart message sent.

## 2022-01-23 NOTE — Telephone Encounter (Signed)
Revealed negative genetic testing.  Discussed that we do not know why there is cancer in the family. It could be sporadic/famillial, due to a change in a gene that she did not inherit, due to a different gene that we are not testing, or maybe our current technology may not be able to pick something up.  It will be important for her to keep in contact with genetics to keep up with whether additional testing may be needed.  TC score over 20%.  Recommended consideration for breast MRIs.  ? ? ? ?

## 2022-01-23 NOTE — Progress Notes (Signed)
HPI:   ?Ms. Meneely was previously seen in the [facility] clinic due to a family history of breast cancer and concerns regarding a hereditary predisposition to cancer. Please refer to our prior cancer genetics clinic note for more information regarding our discussion, assessment and recommendations, at the time. Ms. Dungee recent genetic test results were disclosed to her, as were recommendations warranted by these results. These results and recommendations are discussed in more detail below. ? ?CANCER HISTORY:  ?Oncology History  ? No history exists.  ? ? ?FAMILY HISTORY:  ?We obtained a detailed, 4-generation family history.  Significant diagnoses are listed below: ?Family History  ?Problem Relation Age of Onset  ? Breast cancer Mother 13  ? Colon polyps Mother   ?     unknown number  ? Lung cancer Maternal Grandmother   ?     dx 35s  ? ? ? ?Ms. Yellin had limited information about paternal family history. Ms. Hosie is unaware of previous family history of genetic testing for hereditary cancer risks. The patient's affected relatives were unavailable for genetic testing at this point in time. There is no reported Ashkenazi Jewish ancestry. There is no known consanguinity. ? ?GENETIC TEST RESULTS:  ?The [facility] CancerNext-Expanded +RNAinsight Panel found no pathogenic mutations. The CancerNext-Expanded gene panel offered by [facility] and includes sequencing, rearrangement, and RNA analysis for the following 77 genes: AIP, ALK, APC, ATM, AXIN2, BAP1, BARD1, BLM, BMPR1A, BRCA1, BRCA2, BRIP1, CDC73, CDH1, CDK4, CDKN1B, CDKN2A, CHEK2, CTNNA1, DICER1, FANCC, FH, FLCN, GALNT12, KIF1B, LZTR1, MAX, MEN1, MET, MLH1, MSH2, MSH3, MSH6, MUTYH, NBN, NF1, NF2, NTHL1, PALB2, PHOX2B, PMS2, POT1, PRKAR1A, PTCH1, PTEN, RAD51C, RAD51D, RB1, RECQL, RET, SDHA, SDHAF2, SDHB, SDHC, SDHD, SMAD4, SMARCA4, SMARCB1, SMARCE1, STK11, SUFU, TMEM127, TP53, TSC1, TSC2, VHL and XRCC2 (sequencing and deletion/duplication);  EGFR, EGLN1, HOXB13, KIT, MITF, PDGFRA, POLD1, and POLE (sequencing only); EPCAM and GREM1 (deletion/duplication only).  ? ?The test report has been scanned into EPIC and is located under the Molecular Pathology section of the Results Review tab.  A portion of the result report is included below for reference. Genetic testing reported out on January 08, 2022.  ? ? ? ?Even though a pathogenic variant was not identified, possible explanations for the cancer in the family may include: ?There may be no hereditary risk for cancer in the family. The cancers in Ms. Mussell's family may be sporadic/familial or due to other genetic and environmental factors. ?There may be a gene mutation in one of these genes that current testing methods cannot detect but that chance is small. ?There could be another gene that has not yet been discovered, or that we have not yet tested, that is responsible for the cancer diagnoses in the family.  ?It is also possible there is a hereditary cause for the cancer in the family that Ms. Gaspar did not inherit. ? ?Therefore, it is important to remain in touch with cancer genetics in the future so that we can continue to offer Ms. Kinker the most up to date genetic testing.  ? ? ?ADDITIONAL GENETIC TESTING:  ?We discussed with Ms. Vuncannon that her genetic testing was fairly extensive.  If there are genes identified to increase cancer risk that can be analyzed in the future, we would be happy to discuss and coordinate this testing at that time.   ? ?CANCER SCREENING RECOMMENDATIONS:  ?Ms. Stage's test result is considered negative (normal).  This means that we have not identified a hereditary cause for  her family history of breast cancer at this time.  ? ?An individual's cancer risk and medical management are not determined by genetic test results alone. Overall cancer risk assessment incorporates additional factors, including personal medical history, family history, and any available genetic  information that may result in a personalized plan for cancer prevention and surveillance. Therefore, it is recommended she continue to follow the cancer management and screening guidelines provided by her primary healthcare provider. ? ? ? ?Ms. Traughber has been determined to be at high risk for breast cancer.  Her Tyrer-Cuzick risk score is 20.4%.  This risk estimate can change over time and may be repeated to reflect new information in her personal or family history in the future.  For women with a greater than 20% lifetime risk of breast cancer, the [employer]  (NCCN) recommends the following:  ?Clinical encounter every 6-12 months to begin when identified as being at increased risk, but not before age 47  ?Annual mammograms. Tomosynthesis is recommended starting 10 years earlier than the youngest breast cancer diagnosis in the family or at age 55 (whichever comes first), but not before age 59   ?Considering annual breast MRI starting 10 years earlier than the youngest breast cancer diagnosis in the family or at age 34 (whichever comes first), but not before age 23  ? ? ?  ?In addition to a yearly mammogram and physical exam by a healthcare provider, she should discuss the usefulness of an annual breast MRI with her referring provider.  Ms. Sirianni is not interested in a referral to the high risk breast clinic at this time.  She knows to reach out in the future if she is interested.  ? ? ?RECOMMENDATIONS FOR FAMILY MEMBERS:   ?Since she did not inherit a identifiable mutation in a cancer predisposition gene included on this panel, her children could not have inherited a known mutation from her in one of these genes. ?Individuals in this family might be at some increased risk of developing cancer, over the general population risk, due to the family history of cancer.  Individuals in the family should notify their providers of the family history of cancer. We recommend women in this family  have a yearly mammogram beginning at age 45, or 66 years younger than the earliest onset of cancer, an annual clinical breast exam, and perform monthly breast self-exams.   ?Other members of the family may still carry a pathogenic variant in one of these genes that Ms. Marcucci did not inherit. Based on the family history, we recommend her mother, who was diagnosed with breast cancer at age 33, have genetic counseling and testing. Ms. Prescott will let us know if we can be of any assistance in coordinating genetic counseling and/or testing for this family member.   ? ? ?FOLLOW-UP:  ?Lastly, we discussed with Ms. Bradway that cancer genetics is a rapidly advancing field and it is possible that new genetic tests will be appropriate for her and/or her family members in the future. We encouraged her to remain in contact with cancer genetics on an annual basis so we can update her personal and family histories and let her know of advances in cancer genetics that may benefit this family.  ? ?Our contact number was provided. Ms. Hackmann questions were answered to her satisfaction, and she knows she is welcome to call us at anytime with additional questions or concerns.  ? ?Magdala Brahmbhatt M. Joette Catching, [place], [place] ?Genetic Counselor ?[email or web address] ?(P) [phone] ? ?

## 2022-01-26 DIAGNOSIS — J338 Other polyp of sinus: Secondary | ICD-10-CM | POA: Diagnosis not present

## 2022-01-26 DIAGNOSIS — J310 Chronic rhinitis: Secondary | ICD-10-CM | POA: Diagnosis not present

## 2022-01-28 DIAGNOSIS — M79674 Pain in right toe(s): Secondary | ICD-10-CM | POA: Diagnosis not present

## 2022-01-28 DIAGNOSIS — M9905 Segmental and somatic dysfunction of pelvic region: Secondary | ICD-10-CM | POA: Diagnosis not present

## 2022-01-28 DIAGNOSIS — M9903 Segmental and somatic dysfunction of lumbar region: Secondary | ICD-10-CM | POA: Diagnosis not present

## 2022-01-28 DIAGNOSIS — M9906 Segmental and somatic dysfunction of lower extremity: Secondary | ICD-10-CM | POA: Diagnosis not present

## 2022-02-11 DIAGNOSIS — M9906 Segmental and somatic dysfunction of lower extremity: Secondary | ICD-10-CM | POA: Diagnosis not present

## 2022-02-11 DIAGNOSIS — M79674 Pain in right toe(s): Secondary | ICD-10-CM | POA: Diagnosis not present

## 2022-02-11 DIAGNOSIS — M9903 Segmental and somatic dysfunction of lumbar region: Secondary | ICD-10-CM | POA: Diagnosis not present

## 2022-02-11 DIAGNOSIS — M9905 Segmental and somatic dysfunction of pelvic region: Secondary | ICD-10-CM | POA: Diagnosis not present

## 2022-02-24 ENCOUNTER — Telehealth: Payer: Self-pay

## 2022-02-24 DIAGNOSIS — Z803 Family history of malignant neoplasm of breast: Secondary | ICD-10-CM

## 2022-02-24 NOTE — Telephone Encounter (Signed)
Please set up Breast MRI for 06/2022 for her increased lifetime risk per JJ.  ?

## 2022-02-26 NOTE — Telephone Encounter (Signed)
Recall letter placed to contact office so MRI can be scheduled for 06/2022.

## 2022-04-29 DIAGNOSIS — M9906 Segmental and somatic dysfunction of lower extremity: Secondary | ICD-10-CM | POA: Diagnosis not present

## 2022-04-29 DIAGNOSIS — M9905 Segmental and somatic dysfunction of pelvic region: Secondary | ICD-10-CM | POA: Diagnosis not present

## 2022-04-29 DIAGNOSIS — M9903 Segmental and somatic dysfunction of lumbar region: Secondary | ICD-10-CM | POA: Diagnosis not present

## 2022-04-29 DIAGNOSIS — M79674 Pain in right toe(s): Secondary | ICD-10-CM | POA: Diagnosis not present

## 2022-05-27 NOTE — Telephone Encounter (Signed)
Screening mammo was done 01/02/2022. MRI after 07/05/2022.

## 2022-07-16 NOTE — Telephone Encounter (Signed)
MRI Scheduled for 07/31/2022. Will forward for pre cert.

## 2022-07-16 NOTE — Telephone Encounter (Signed)
[facility] tried contacting the pt to schedule on 07/01/2022 and 07/06/2022 w/ no success as of yet.   Pt notified and voiced understanding.

## 2022-07-28 DIAGNOSIS — J343 Hypertrophy of nasal turbinates: Secondary | ICD-10-CM | POA: Diagnosis not present

## 2022-07-28 DIAGNOSIS — J330 Polyp of nasal cavity: Secondary | ICD-10-CM | POA: Diagnosis not present

## 2022-07-28 DIAGNOSIS — J310 Chronic rhinitis: Secondary | ICD-10-CM | POA: Diagnosis not present

## 2022-07-31 ENCOUNTER — Ambulatory Visit
Admission: RE | Admit: 2022-07-31 | Discharge: 2022-07-31 | Disposition: A | Discharge status: Home / Self Care | Payer: BC Managed Care – PPO | Source: Ambulatory Visit | Attending: Obstetrics and Gynecology | Admitting: Obstetrics and Gynecology

## 2022-07-31 DIAGNOSIS — Z803 Family history of malignant neoplasm of breast: Secondary | ICD-10-CM

## 2022-07-31 DIAGNOSIS — N6489 Other specified disorders of breast: Secondary | ICD-10-CM | POA: Diagnosis not present

## 2022-07-31 MED ORDER — GADOPICLENOL 0.5 MMOL/ML IV SOLN
6.0000 mL | Freq: Once | INTRAVENOUS | Status: AC | PRN
  Administered 2022-07-31: 6 mL via INTRAVENOUS

## 2022-08-03 ENCOUNTER — Telehealth: Payer: Self-pay

## 2022-08-03 NOTE — Telephone Encounter (Signed)
Donna Dom, MD  P [facility] Triage Please let the patient know that she had 2 areas in her left breast that are concerning and need biopsy. Please set this up for her.

## 2022-08-03 NOTE — Telephone Encounter (Signed)
Spoke with patient and informed her of result and need for MRI guided biopsies.  She is agreeable to [place] scheduling for her. Someone from [facility] to call me back to assist with placing this order and scheduling.

## 2022-08-04 ENCOUNTER — Other Ambulatory Visit: Payer: Self-pay | Admitting: Obstetrics and Gynecology

## 2022-08-04 DIAGNOSIS — R928 Other abnormal and inconclusive findings on diagnostic imaging of breast: Secondary | ICD-10-CM

## 2022-08-04 NOTE — Telephone Encounter (Signed)
I called back to [place] and was assisted with placing orders and scheduling MRI GUIDED BREAST BIOPSIES.  Patient is scheduled at [facility] Monday Oct. 30 at 8:10am for an 8:50am appt. [place] Npo 4 hours prior to appt. Do not wear metal hair pins or jewelry. No lotions, powders or deodorant. When she finishes there she will be sent to [facility] for light mammogram to take pic a of clip.   Patient was informed of date/time location and all this other information above.  Will route to [place] for PA.

## 2022-08-07 NOTE — Telephone Encounter (Signed)
MRI was done and breast biopsies were recommended. Refer to MRI result note.

## 2022-08-10 ENCOUNTER — Ambulatory Visit
Admission: RE | Admit: 2022-08-10 | Discharge: 2022-08-10 | Disposition: A | Discharge status: Home / Self Care | Payer: BC Managed Care – PPO | Source: Ambulatory Visit | Attending: Obstetrics and Gynecology | Admitting: Obstetrics and Gynecology

## 2022-08-10 DIAGNOSIS — D242 Benign neoplasm of left breast: Secondary | ICD-10-CM | POA: Diagnosis not present

## 2022-08-10 DIAGNOSIS — R928 Other abnormal and inconclusive findings on diagnostic imaging of breast: Secondary | ICD-10-CM

## 2022-08-10 DIAGNOSIS — N6321 Unspecified lump in the left breast, upper outer quadrant: Secondary | ICD-10-CM | POA: Diagnosis not present

## 2022-08-10 MED ORDER — GADOPICLENOL 0.5 MMOL/ML IV SOLN
6.0000 mL | Freq: Once | INTRAVENOUS | Status: AC | PRN
  Administered 2022-08-10: 6 mL via INTRAVENOUS

## 2022-08-11 ENCOUNTER — Other Ambulatory Visit: Payer: Self-pay | Admitting: Obstetrics and Gynecology

## 2022-08-11 DIAGNOSIS — R928 Other abnormal and inconclusive findings on diagnostic imaging of breast: Secondary | ICD-10-CM

## 2022-08-26 ENCOUNTER — Ambulatory Visit
Admission: RE | Admit: 2022-08-26 | Discharge: 2022-08-26 | Disposition: A | Discharge status: Home / Self Care | Payer: BC Managed Care – PPO | Source: Ambulatory Visit | Attending: Obstetrics and Gynecology | Admitting: Obstetrics and Gynecology

## 2022-08-26 DIAGNOSIS — N6012 Diffuse cystic mastopathy of left breast: Secondary | ICD-10-CM | POA: Diagnosis not present

## 2022-08-26 DIAGNOSIS — N6324 Unspecified lump in the left breast, lower inner quadrant: Secondary | ICD-10-CM | POA: Diagnosis not present

## 2022-08-26 DIAGNOSIS — Z803 Family history of malignant neoplasm of breast: Secondary | ICD-10-CM

## 2022-08-26 DIAGNOSIS — R928 Other abnormal and inconclusive findings on diagnostic imaging of breast: Secondary | ICD-10-CM

## 2022-08-26 DIAGNOSIS — D242 Benign neoplasm of left breast: Secondary | ICD-10-CM | POA: Diagnosis not present

## 2022-08-26 MED ORDER — GADOPICLENOL 0.5 MMOL/ML IV SOLN
6.0000 mL | Freq: Once | INTRAVENOUS | Status: AC | PRN
  Administered 2022-08-26: 6 mL via INTRAVENOUS

## 2022-09-10 ENCOUNTER — Ambulatory Visit (HOSPITAL_COMMUNITY)
Admission: RE | Admit: 2022-09-10 | Discharge: 2022-09-10 | Disposition: A | Discharge status: Home / Self Care | Payer: BC Managed Care – PPO | Source: Ambulatory Visit | Attending: Internal Medicine | Admitting: Internal Medicine

## 2022-09-10 ENCOUNTER — Other Ambulatory Visit: Payer: Self-pay

## 2022-09-10 ENCOUNTER — Encounter (HOSPITAL_COMMUNITY): Payer: Self-pay

## 2022-09-10 VITALS — BP 110/72 | HR 76 | Temp 98.5°F | Resp 18

## 2022-09-10 DIAGNOSIS — J019 Acute sinusitis, unspecified: Secondary | ICD-10-CM

## 2022-09-10 MED ORDER — FLUTICASONE PROPIONATE 50 MCG/ACT NA SUSP: 1.0000 | Freq: Every day | NASAL | 0 refills | Status: DC

## 2022-09-10 MED ORDER — AMOXICILLIN-POT CLAVULANATE 875-125 MG PO TABS: 1.0000 | ORAL_TABLET | Freq: Two times a day (BID) | ORAL | 0 refills | Status: DC

## 2022-09-10 NOTE — Discharge Instructions (Addendum)
Please continue using the humidifier Take medications as prescribed Saline nasal rinse or nasal spray with help with nasal congestion and nasal drainage If you have worsening symptoms please return to urgent care to be reevaluated.

## 2022-09-10 NOTE — ED Triage Notes (Signed)
Patients son was diagnosed with RSV over 2 weeks ago.  Shortly after childs diagnosis, patient started having symptoms.  Symptoms have last 2 weeks.  Patient has head congestion and chest congestion -both are green.  Feels pressure in ears. Dull ache in right ear.    Has been taking mucinex, ibuprofen, sudafed.

## 2022-09-10 NOTE — ED Provider Notes (Addendum)
[facility]    CSN: [id number] Arrival date & time: 09/10/22  1847      History   Chief Complaint Chief Complaint  Patient presents with   Nasal Congestion    Entered by patient   URI    HPI Donna Mcdowell is a 41 y.o. female comes to the urgent care with a 2-week history of facial pain, greenish nasal discharge and fullness in both ears.  Patient is child was diagnosed with RSV about 2 weeks ago.  Shortly after the diagnosis patient also started experiencing some nasal congestion and runny nose.  His symptoms improved a few days after it started but over the past several days the nasal congestion has worsened.  She complains of greenish nasal discharge starting a few days ago.  No fever or chills.  She has postnasal drainage.  No nausea or vomiting.  Patient is using Mucinex with no improvement in her symptoms.  She is currently using a humidifier with no significant improvement in her symptoms..  Patient is also complaining of ear fullness but denies any hearing loss or ringing in both ears.  HPI  Past Medical History:  Diagnosis Date   Carbuncle of face    forehead   Deviated septum 07/2018   Family history of breast cancer 12/26/2021   Nasal mass 07/2018   Nasal turbinate hypertrophy 07/2018   bilateral    Patient Active Problem List   Diagnosis Date Noted   Genetic testing 01/15/2022   Family history of breast cancer 12/26/2021   Disequilibrium 10/16/2019   Dizziness 10/16/2019   Pressure sensation in right ear 10/16/2019   Localized swelling, mass or lump of neck 05/26/2017   Cigarette nicotine dependence with nicotine-induced disorder 11/02/2016   Encounter for smoking cessation counseling 11/02/2016   CIN III (cervical intraepithelial neoplasia grade III) with severe dysplasia 05/13/2016    Past Surgical History:  Procedure Laterality Date   EXCISION NASAL MASS Left 08/30/2018   Procedure: EXCISION OF NASAL MASS;  Surgeon: Leta Baptist, MD;   Location: [place];  Service: ENT;  Laterality: Left;   EXCISION NASAL MASS Left 03/18/2021   Procedure: EXCISION LEFT NASAL INVERTED PAPILLOMAS;  Surgeon: Leta Baptist, MD;  Location: [place];  Service: ENT;  Laterality: Left;   FINGER TENDON REPAIR Right 06/23/2010   long finger   INCISION AND DRAINAGE BREAST ABSCESS Left 06/09/2011   NASAL SEPTOPLASTY W/ TURBINOPLASTY Bilateral 08/30/2018   Procedure: NASAL SEPTOPLASTY WITH TURBINATE REDUCTION;  Surgeon: Leta Baptist, MD;  Location: [employer];  Service: ENT;  Laterality: Bilateral;   TRIGGER FINGER RELEASE Right 06/23/2010   long finger   WISDOM TOOTH EXTRACTION      OB History     Gravida  0   Para      Term      Preterm      AB      Living         SAB      IAB      Ectopic      Multiple      Live Births               Home Medications    Prior to Admission medications   Medication Sig Start Date End Date Taking? Authorizing Provider  amoxicillin-clavulanate (AUGMENTIN) 875-125 MG tablet Take 1 tablet by mouth every 12 (twelve) hours. 09/10/22  Yes Deepa Barthel, Myrene Galas, MD  fluticasone (FLONASE) 50 MCG/ACT nasal  spray Place 1 spray into both nostrils daily. 09/10/22  Yes Letia Guidry, Myrene Galas, MD  acetaminophen (TYLENOL) 500 MG tablet Take 1,000 mg by mouth every 6 (six) hours as needed.    [provider]  Ibuprofen (ADVIL PO) Take by mouth as needed.    [provider]    Family History Family History  Problem Relation Age of Onset   Breast cancer Mother 64   Colon polyps Mother        unknown number   Osteoarthritis Maternal Grandmother    Lung cancer Maternal Grandmother        dx 21s   Osteoarthritis Maternal Grandfather    Heart failure Maternal Grandfather     Social History Social History   Tobacco Use   Smoking status: Former    Packs/day: 1.00    Years: 19.00    Total pack years: 19.00    Types: Cigarettes   Smokeless tobacco:  Never  Vaping Use   Vaping Use: Every day   Substances: Nicotine  Substance Use Topics   Alcohol use: Yes    Comment: Drinks 2 drinks a day   Drug use: No     Allergies   Oxycodone   Review of Systems Review of Systems As per HPI  Physical Exam Triage Vital Signs ED Triage Vitals  Enc Vitals Group     BP 09/10/22 1923 110/72     Pulse Rate 09/10/22 1923 76     Resp 09/10/22 1923 18     Temp 09/10/22 1923 98.5 F (36.9 C)     Temp Source 09/10/22 1923 Oral     SpO2 09/10/22 1923 98 %     Weight --      Height --      Head Circumference --      Peak Flow --      Pain Score 09/10/22 1920 5     Pain Loc --      Pain Edu? --      Excl. in [place]? --    No data found.  Updated Vital Signs BP 110/72 (BP Location: Left Arm)   Pulse 76   Temp 98.5 F (36.9 C) (Oral)   Resp 18   LMP 08/25/2022 (Exact Date)   SpO2 98%   Visual Acuity Right Eye Distance:   Left Eye Distance:   Bilateral Distance:    Right Eye Near:   Left Eye Near:    Bilateral Near:     Physical Exam Vitals and nursing note reviewed.  Constitutional:      General: She is not in acute distress.    Appearance: She is not ill-appearing.  HENT:     Right Ear: Tympanic membrane normal.     Left Ear: Tympanic membrane normal.     Mouth/Throat:     Mouth: Mucous membranes are moist.     Pharynx: No oropharyngeal exudate or posterior oropharyngeal erythema.  Eyes:     Extraocular Movements: Extraocular movements intact.     Conjunctiva/sclera: Conjunctivae normal.  Cardiovascular:     Rate and Rhythm: Normal rate and regular rhythm.     Pulses: Normal pulses.     Heart sounds: Normal heart sounds.  Pulmonary:     Effort: Pulmonary effort is normal.     Breath sounds: Normal breath sounds.  Abdominal:     General: Bowel sounds are normal.     Palpations: Abdomen is soft.  Neurological:     Mental Status: She is alert.  UC Treatments / Results  Labs (all labs ordered are listed,  but only abnormal results are displayed) Labs Reviewed - No data to display  EKG   Radiology No results found.  Procedures Procedures (including critical care time)  Medications Ordered in UC Medications - No data to display  Initial Impression / Assessment and Plan / UC Course  I have reviewed the triage vital signs and the nursing notes.  Pertinent labs & imaging results that were available during my care of the patient were reviewed by me and considered in my medical decision making (see chart for details).     1.  Acute sinusitis with symptoms greater than 10 days: Continue humidifier use Augmentin 875-125 mg twice daily Saline nasal spray Tylenol/Motrin as needed for pain and/or fever Flonase to help with nasal congestion Return to urgent care if symptoms worsen. Final Clinical Impressions(s) / UC Diagnoses   Final diagnoses:  Acute sinusitis with symptoms greater than 10 days     Discharge Instructions      Please continue using the humidifier Take medications as prescribed Saline nasal rinse or nasal spray with help with nasal congestion and nasal drainage If you have worsening symptoms please return to urgent care to be reevaluated.   ED Prescriptions     Medication Sig Dispense Auth. Provider   amoxicillin-clavulanate (AUGMENTIN) 875-125 MG tablet Take 1 tablet by mouth every 12 (twelve) hours. 14 tablet Tashonda Pinkus, Myrene Galas, MD   fluticasone (FLONASE) 50 MCG/ACT nasal spray Place 1 spray into both nostrils daily. 16 g Alayza Pieper, Myrene Galas, MD      PDMP not reviewed this encounter.   Chase Picket, MD 09/10/22 1944    Chase Picket, MD 09/10/22 [phone]

## 2022-09-30 ENCOUNTER — Ambulatory Visit
Admission: RE | Admit: 2022-09-30 | Discharge: 2022-09-30 | Disposition: A | Discharge status: Home / Self Care | Payer: BC Managed Care – PPO | Source: Ambulatory Visit

## 2022-09-30 VITALS — BP 111/71 | HR 65 | Temp 98.0°F | Resp 18

## 2022-09-30 DIAGNOSIS — R208 Other disturbances of skin sensation: Secondary | ICD-10-CM

## 2022-09-30 NOTE — ED Provider Notes (Signed)
[facility]    CSN: [id number] Arrival date & time: 09/30/22  0946      History   Chief Complaint Chief Complaint  Patient presents with   Rash    Entered by patient    HPI Donna Mcdowell is a 41 y.o. female.   Patient presents today with an intermittent burning sensation to back that has been present for about 2 days.  Patient is concerned for rash.  Although, patient reports that they have not noticed a rash to the skin.  Denies any obvious injury to the area.  Denies any associated fever.  Has not used any over-the-counter medications to alleviate symptoms.  Patient reports they changed their detergent recently but that it is supposed to be for sensitive skin.   Rash   Past Medical History:  Diagnosis Date   Carbuncle of face    forehead   Deviated septum 07/2018   Family history of breast cancer 12/26/2021   Nasal mass 07/2018   Nasal turbinate hypertrophy 07/2018   bilateral    Patient Active Problem List   Diagnosis Date Noted   Genetic testing 01/15/2022   Family history of breast cancer 12/26/2021   Disequilibrium 10/16/2019   Dizziness 10/16/2019   Pressure sensation in right ear 10/16/2019   Localized swelling, mass or lump of neck 05/26/2017   Cigarette nicotine dependence with nicotine-induced disorder 11/02/2016   Encounter for smoking cessation counseling 11/02/2016   CIN III (cervical intraepithelial neoplasia grade III) with severe dysplasia 05/13/2016    Past Surgical History:  Procedure Laterality Date   EXCISION NASAL MASS Left 08/30/2018   Procedure: EXCISION OF NASAL MASS;  Surgeon: Leta Baptist, MD;  Location: [place];  Service: ENT;  Laterality: Left;   EXCISION NASAL MASS Left 03/18/2021   Procedure: EXCISION LEFT NASAL INVERTED PAPILLOMAS;  Surgeon: Leta Baptist, MD;  Location: [place];  Service: ENT;  Laterality: Left;   FINGER TENDON REPAIR Right 06/23/2010   long finger   INCISION AND  DRAINAGE BREAST ABSCESS Left 06/09/2011   NASAL SEPTOPLASTY W/ TURBINOPLASTY Bilateral 08/30/2018   Procedure: NASAL SEPTOPLASTY WITH TURBINATE REDUCTION;  Surgeon: Leta Baptist, MD;  Location: [place];  Service: ENT;  Laterality: Bilateral;   TRIGGER FINGER RELEASE Right 06/23/2010   long finger   WISDOM TOOTH EXTRACTION      OB History     Gravida  0   Para      Term      Preterm      AB      Living         SAB      IAB      Ectopic      Multiple      Live Births               Home Medications    Prior to Admission medications   Medication Sig Start Date End Date Taking? Authorizing Provider  acetaminophen (TYLENOL) 500 MG tablet Take 1,000 mg by mouth every 6 (six) hours as needed.    [provider]  amoxicillin-clavulanate (AUGMENTIN) 875-125 MG tablet Take 1 tablet by mouth every 12 (twelve) hours. 09/10/22   Chase Picket, MD  fluticasone (FLONASE) 50 MCG/ACT nasal spray Place 1 spray into both nostrils daily. 09/10/22   LampteyMyrene Galas, MD  Ibuprofen (ADVIL PO) Take by mouth as needed.    [provider]    Family History Family  History  Problem Relation Age of Onset   Breast cancer Mother 51   Colon polyps Mother        unknown number   Osteoarthritis Maternal Grandmother    Lung cancer Maternal Grandmother        dx 10s   Osteoarthritis Maternal Grandfather    Heart failure Maternal Grandfather     Social History Social History   Tobacco Use   Smoking status: Former    Packs/day: 1.00    Years: 19.00    Total pack years: 19.00    Types: Cigarettes   Smokeless tobacco: Never  Vaping Use   Vaping Use: Every day   Substances: Nicotine  Substance Use Topics   Alcohol use: Yes    Comment: Drinks 2 drinks a day   Drug use: No     Allergies   Oxycodone   Review of Systems Review of Systems Per HPI  Physical Exam Triage Vital Signs ED Triage Vitals  Enc Vitals Group     BP 09/30/22  1041 111/71     Pulse Rate 09/30/22 1041 65     Resp 09/30/22 1041 18     Temp 09/30/22 1041 98 F (36.7 C)     Temp src --      SpO2 09/30/22 1041 97 %     Weight --      Height --      Head Circumference --      Peak Flow --      Pain Score 09/30/22 1039 3     Pain Loc --      Pain Edu? --      Excl. in [place]? --    No data found.  Updated Vital Signs BP 111/71   Pulse 65   Temp 98 F (36.7 C)   Resp 18   SpO2 97%   Visual Acuity Right Eye Distance:   Left Eye Distance:   Bilateral Distance:    Right Eye Near:   Left Eye Near:    Bilateral Near:     Physical Exam Constitutional:      General: She is not in acute distress.    Appearance: Normal appearance. She is not toxic-appearing or diaphoretic.  HENT:     Head: Normocephalic and atraumatic.  Eyes:     Extraocular Movements: Extraocular movements intact.     Conjunctiva/sclera: Conjunctivae normal.  Pulmonary:     Effort: Pulmonary effort is normal.  Skin:         Comments: Patient reports burning sensation occurs at circled area on diagram on back.  Although, there is no obvious discoloration, swelling, abscess, lesion, rash noted in this area.  Neurological:     General: No focal deficit present.     Mental Status: She is alert and oriented to person, place, and time. Mental status is at baseline.  Psychiatric:        Mood and Affect: Mood normal.        Behavior: Behavior normal.        Thought Content: Thought content normal.        Judgment: Judgment normal.      UC Treatments / Results  Labs (all labs ordered are listed, but only abnormal results are displayed) Labs Reviewed - No data to display  EKG   Radiology No results found.  Procedures Procedures (including critical care time)  Medications Ordered in UC Medications - No data to display  Initial Impression / Assessment and Plan /  UC Course  I have reviewed the triage vital signs and the nursing notes.  Pertinent labs &  imaging results that were available during my care of the patient were reviewed by me and considered in my medical decision making (see chart for details).     There is no obvious rash or abnormality noted to patient's skin where the burning sensation is occurring.  Differential diagnoses include musculoskeletal pain versus herpes zoster versus contact dermatitis.  No signs of bacterial infection.  Although, symptoms are not definitive for any of these etiologies given no obvious abnormality on physical exam.  Therefore, advised patient to monitor symptoms closely and to follow-up in the next few days if symptoms persist or worsen or if a rash develops.  Patient verbalized understanding and was agreeable plan. Final Clinical Impressions(s) / UC Diagnoses   Final diagnoses:  Burning sensation of skin     Discharge Instructions      There are no obvious abnormalities that could be causing your discomfort at this time.  No rashes noted.  I recommend that you monitor your symptoms and follow-up if symptoms persist or worsen.  Using a cool compress such as ice or cool washcloth may be helpful with discomfort.    ED Prescriptions   None    PDMP not reviewed this encounter.   Teodora Medici, [place] 09/30/22 1144

## 2022-09-30 NOTE — ED Triage Notes (Signed)
Pt is present today with a rash on the left back shoulder. Pt states that she noticed it x2 days ago.

## 2022-09-30 NOTE — Discharge Instructions (Signed)
There are no obvious abnormalities that could be causing your discomfort at this time.  No rashes noted.  I recommend that you monitor your symptoms and follow-up if symptoms persist or worsen.  Using a cool compress such as ice or cool washcloth may be helpful with discomfort.

## 2022-10-15 DIAGNOSIS — D485 Neoplasm of uncertain behavior of skin: Secondary | ICD-10-CM | POA: Diagnosis not present

## 2022-10-15 DIAGNOSIS — D225 Melanocytic nevi of trunk: Secondary | ICD-10-CM | POA: Diagnosis not present

## 2022-10-15 DIAGNOSIS — Z1283 Encounter for screening for malignant neoplasm of skin: Secondary | ICD-10-CM | POA: Diagnosis not present

## 2022-10-29 NOTE — Progress Notes (Unsigned)
42 y.o. G0P0 Married White or Caucasian Not Hispanic or Latino female here for annual exam.   Period Cycle (Days): 28 Period Duration (Days): 4-5 Period Pattern: Regular Menstrual Flow: Heavy Menstrual Control: Tampon Menstrual Control Change Freq (Hours): 2-3 Dysmenorrhea: (!) Moderate Dysmenorrhea Symptoms: Cramping  FH of breast cancer, negative genetic testing. TC risk is 20.4%  She and her wife are thinking about having another baby.   Patient's last menstrual period was 10/22/2022.          Sexually active: Yes.    The current method of family planning is none.    Exercising: Yes.     Cardio and strength training  Smoker:  vaping   Health Maintenance: Pap: 10-23-20 Neg:Neg HR HPV History of abnormal Pap:  Yes, LEEP for CIN III in 5/14 MMG:  see epic, benign biopsy in 11/23. Mammogram due at the end of March.  BMD:   n/a Colonoscopy: none  TDaP:  09/04/14 Gardasil: none    reports that she has quit smoking. Her smoking use included cigarettes. She has a 19.00 pack-year smoking history. She has never used smokeless tobacco. She reports current alcohol use. She reports that she does not use drugs. Few glasses of wine a week. 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 Wife is a Corporate treasurer. Son Grayce Sessions is 55.18 years old (her genetic son, wife carried).    Past Medical History:  Diagnosis Date   Carbuncle of face    forehead   Deviated septum 07/2018   Family history of breast cancer 12/26/2021   Nasal mass 07/2018   Nasal turbinate hypertrophy 07/2018   bilateral    Past Surgical History:  Procedure Laterality Date   EXCISION NASAL MASS Left 08/30/2018   Procedure: EXCISION OF NASAL MASS;  Surgeon: Leta Baptist, MD;  Location: [place];  Service: ENT;  Laterality: Left;   EXCISION NASAL MASS Left 03/18/2021   Procedure: EXCISION LEFT NASAL INVERTED PAPILLOMAS;  Surgeon: Leta Baptist, MD;  Location: [place];  Service: ENT;  Laterality: Left;   FINGER TENDON REPAIR Right 06/23/2010   long finger   INCISION AND DRAINAGE BREAST ABSCESS Left 06/09/2011   NASAL SEPTOPLASTY W/ TURBINOPLASTY Bilateral 08/30/2018   Procedure: NASAL SEPTOPLASTY WITH TURBINATE REDUCTION;  Surgeon: Leta Baptist, MD;  Location: [facility];  Service: ENT;  Laterality: Bilateral;   TRIGGER FINGER RELEASE Right 06/23/2010   long finger   WISDOM TOOTH EXTRACTION      Current Outpatient Medications  Medication Sig Dispense Refill   acetaminophen (TYLENOL) 500 MG tablet Take 1,000 mg by mouth every 6 (six) hours as needed.     Cholecalciferol (D3 PO) Take by mouth.     Cyanocobalamin (B-12 PO) Take by mouth.     fluticasone (FLONASE) 50 MCG/ACT nasal spray Place 1 spray into both nostrils daily. 16 g 0   Ibuprofen (ADVIL PO) Take by mouth as needed.     Multiple Vitamin (MULTIVITAMIN) tablet Take 1 tablet by mouth daily.     No current facility-administered medications for this visit.    Family History  Problem Relation Age of Onset   Breast cancer Mother 57   Colon polyps Mother        unknown number   Osteoarthritis Maternal Grandmother    Lung cancer Maternal Grandmother        dx 22s   Osteoarthritis Maternal Grandfather  Heart failure Maternal Grandfather     Review of Systems  All other systems reviewed and are negative.   Exam:   BP 110/64   Pulse 62   Ht '5\' 4"'$  (1.626 m)   Wt 140 lb (63.5 kg)   LMP 10/22/2022   SpO2 100%   BMI 24.03 kg/m   Weight change: '@WEIGHTCHANGE'$ @ Height:   Height: '5\' 4"'$  (162.6 cm)  Ht Readings from Last 3 Encounters:  11/04/22 '5\' 4"'$  (1.626 m)  10/29/21 5' 3.75" (1.619 m)  03/18/21 5' 3.5" (1.613 m)    General appearance: alert, cooperative and appears stated age Head: Normocephalic, without obvious abnormality, atraumatic Neck: no adenopathy, supple, symmetrical, trachea midline and thyroid normal to inspection and palpation Lungs: clear to  auscultation bilaterally Cardiovascular: regular rate and rhythm Breasts: normal appearance, no masses or tenderness Abdomen: soft, non-tender; non distended,  no masses,  no organomegaly Extremities: extremities normal, atraumatic, no cyanosis or edema Skin: Skin color, texture, turgor normal. No rashes or lesions Lymph nodes: Cervical, supraclavicular, and axillary nodes normal. No abnormal inguinal nodes palpated Neurologic: Grossly normal   Pelvic: External genitalia:  no lesions              Urethra:  normal appearing urethra with no masses, tenderness or lesions              Bartholins and Skenes: normal                 Vagina: normal appearing vagina with normal color and discharge, no lesions              Cervix: no lesions               Bimanual Exam:  Uterus:  normal size, contour, position, consistency, mobility, non-tender              Adnexa: no mass, fullness, tenderness               Rectovaginal: Confirms               Anus:  normal sphincter tone, no lesions  Gae Dry, CMA chaperoned for the exam.  1. Well woman exam Discussed breast self exam Discussed calcium and vit D intake She is establishing care with a new Primary, will get her lab work done there (including a vit d). No pap this year  2. At increased risk of breast cancer Mammogram due at the end of 3/24, MRI in the fall

## 2022-11-04 ENCOUNTER — Encounter: Payer: Self-pay | Admitting: Obstetrics and Gynecology

## 2022-11-04 ENCOUNTER — Ambulatory Visit (INDEPENDENT_AMBULATORY_CARE_PROVIDER_SITE_OTHER): Payer: BC Managed Care – PPO | Admitting: Obstetrics and Gynecology

## 2022-11-04 VITALS — BP 110/64 | HR 62 | Ht 64.0 in | Wt 140.0 lb

## 2022-11-04 DIAGNOSIS — Z01419 Encounter for gynecological examination (general) (routine) without abnormal findings: Secondary | ICD-10-CM

## 2022-11-04 DIAGNOSIS — Z9189 Other specified personal risk factors, not elsewhere classified: Secondary | ICD-10-CM

## 2022-11-04 NOTE — Patient Instructions (Signed)

## 2022-12-09 ENCOUNTER — Telehealth: Payer: Self-pay | Admitting: Family Medicine

## 2022-12-09 ENCOUNTER — Encounter: Payer: Self-pay | Admitting: Family Medicine

## 2022-12-09 DIAGNOSIS — U071 COVID-19: Secondary | ICD-10-CM

## 2022-12-09 MED ORDER — BENZONATATE 100 MG PO CAPS: 100.0000 mg | ORAL_CAPSULE | Freq: Three times a day (TID) | ORAL | 0 refills | Status: DC | PRN

## 2022-12-09 MED ORDER — PSEUDOEPH-BROMPHEN-DM 30-2-10 MG/5ML PO SYRP: 5.0000 mL | ORAL_SOLUTION | Freq: Four times a day (QID) | ORAL | 0 refills | Status: DC | PRN

## 2022-12-09 MED ORDER — FLUTICASONE PROPIONATE 50 MCG/ACT NA SUSP: 2.0000 | Freq: Every day | NASAL | 0 refills | Status: DC

## 2022-12-09 NOTE — Progress Notes (Signed)
Virtual Visit Consent   Donna Mcdowell, you are scheduled for a virtual visit with a [facility] provider today. Just as with appointments in the office, your consent must be obtained to participate. Your consent will be active for this visit and any virtual visit you may have with one of our providers in the next 365 days. If you have a MyChart account, a copy of this consent can be sent to you electronically.  As this is a virtual visit, video technology does not allow for your provider to perform a traditional examination. This may limit your provider's ability to fully assess your condition. If your provider identifies any concerns that need to be evaluated in person or the need to arrange testing (such as labs, EKG, etc.), we will make arrangements to do so. Although advances in technology are sophisticated, we cannot ensure that it will always work on either your end or our end. If the connection with a video visit is poor, the visit may have to be switched to a telephone visit. With either a video or telephone visit, we are not always able to ensure that we have a secure connection.  By engaging in this virtual visit, you consent to the provision of healthcare and authorize for your insurance to be billed (if applicable) for the services provided during this visit. Depending on your insurance coverage, you may receive a charge related to this service.  I need to obtain your verbal consent now. Are you willing to proceed with your visit today? Donna Mcdowell has provided verbal consent on 12/09/2022 for a virtual visit (video or telephone). Perlie Mayo, NP  Date: 12/09/2022 10:43 AM  Virtual Visit via Video Note   I, Perlie Mayo, connected with  Donna Mcdowell  ([id number], 1981-06-30) on 12/09/22 at 10:45 AM EST by a video-enabled telemedicine application and verified that I am speaking with the correct person using two identifiers.  Location: Patient: Virtual Visit Location  Patient: Home Provider: Virtual Visit Location Provider: Home Office   I discussed the limitations of evaluation and management by telemedicine and the availability of in person appointments. The patient expressed understanding and agreed to proceed.    History of Present Illness: Donna Mcdowell is a 42 y.o. who identifies as a female who was assigned female at birth, and is being seen today for COVID Onset was yesterday afternoon- later in day Associated symptoms are congestion- sinus, cough, sore throat, mild chest discomfort in chest from cough, achy- fever yesterday was 101- none today Modifying factors are tylenol Denies chest pain, shortness of breath, fevers, chills Exposure to sick contacts- unknown- might have happened over weekend- exposure. COVID test: this morning at home + Vaccines: boosters x 1- no recent ones    Problems:  Patient Active Problem List   Diagnosis Date Noted   Genetic testing 01/15/2022   Family history of breast cancer 12/26/2021   Disequilibrium 10/16/2019   Dizziness 10/16/2019   Pressure sensation in right ear 10/16/2019   Localized swelling, mass or lump of neck 05/26/2017   Cigarette nicotine dependence with nicotine-induced disorder 11/02/2016   Encounter for smoking cessation counseling 11/02/2016   CIN III (cervical intraepithelial neoplasia grade III) with severe dysplasia 05/13/2016    Allergies:  Allergies  Allergen Reactions   Oxycodone Nausea And Vomiting   Medications:  Current Outpatient Medications:    acetaminophen (TYLENOL) 500 MG tablet, Take 1,000 mg by mouth every 6 (six) hours as needed., Disp: ,  Rfl:    Cholecalciferol (D3 PO), Take by mouth., Disp: , Rfl:    Cyanocobalamin (B-12 PO), Take by mouth., Disp: , Rfl:    fluticasone (FLONASE) 50 MCG/ACT nasal spray, Place 1 spray into both nostrils daily., Disp: 16 g, Rfl: 0   Ibuprofen (ADVIL PO), Take by mouth as needed., Disp: , Rfl:    Multiple Vitamin (MULTIVITAMIN)  tablet, Take 1 tablet by mouth daily., Disp: , Rfl:   Observations/Objective: Patient is well-developed, well-nourished in no acute distress.  Resting comfortably  at home.  Head is normocephalic, atraumatic.  No labored breathing.  Speech is clear and coherent with logical content.  Patient is alert and oriented at baseline.    Assessment and Plan:   1. COVID-19  - fluticasone (FLONASE) 50 MCG/ACT nasal spray; Place 2 sprays into both nostrils daily.  Dispense: 16 g; Refill: 0 - brompheniramine-pseudoephedrine-DM 30-2-10 MG/5ML syrup; Take 5 mLs by mouth 4 (four) times daily as needed.  Dispense: 120 mL; Refill: 0 - benzonatate (TESSALON) 100 MG capsule; Take 1 capsule (100 mg total) by mouth 3 (three) times daily as needed.  Dispense: 20 capsule; Refill: 0  - Continue OTC symptomatic management of choice - Will send OTC vitamins and supplement information through AVS - Take meds as prescribed - Push fluids - Rest as needed - Discussed return precautions and when to seek in-person evaluation, sent via AVS as well   Reviewed side effects, risks and benefits of medication.    Patient acknowledged agreement and understanding of the plan.   Past Medical, Surgical, Social History, Allergies, and Medications have been Reviewed.   Follow Up Instructions: I discussed the assessment and treatment plan with the patient. The patient was provided an opportunity to ask questions and all were answered. The patient agreed with the plan and demonstrated an understanding of the instructions.  A copy of instructions were sent to the patient via MyChart unless otherwise noted below.    The patient was advised to call back or seek an in-person evaluation if the symptoms worsen or if the condition fails to improve as anticipated.  Time:  I spent 10 minutes with the patient via telehealth technology discussing the above problems/concerns.    Perlie Mayo, NP

## 2022-12-09 NOTE — Patient Instructions (Addendum)
Donna Mcdowell, thank you for joining Perlie Mayo, NP for today's virtual visit.  While this provider is not your primary care provider (PCP), if your PCP is located in our provider database this encounter information will be shared with them immediately following your visit.   [facility] account gives you access to today's visit and all your visits, tests, and labs performed at [facility] " click here if you don't have a [facility] account or go to [email or web address]  Consent: (Patient) Donna Mcdowell provided verbal consent for this virtual visit at the beginning of the encounter.  Current Medications:  Current Outpatient Medications:    benzonatate (TESSALON) 100 MG capsule, Take 1 capsule (100 mg total) by mouth 3 (three) times daily as needed., Disp: 20 capsule, Rfl: 0   brompheniramine-pseudoephedrine-DM 30-2-10 MG/5ML syrup, Take 5 mLs by mouth 4 (four) times daily as needed., Disp: 120 mL, Rfl: 0   fluticasone (FLONASE) 50 MCG/ACT nasal spray, Place 2 sprays into both nostrils daily., Disp: 16 g, Rfl: 0   acetaminophen (TYLENOL) 500 MG tablet, Take 1,000 mg by mouth every 6 (six) hours as needed., Disp: , Rfl:    Cholecalciferol (D3 PO), Take by mouth., Disp: , Rfl:    Cyanocobalamin (B-12 PO), Take by mouth., Disp: , Rfl:    Ibuprofen (ADVIL PO), Take by mouth as needed., Disp: , Rfl:    Multiple Vitamin (MULTIVITAMIN) tablet, Take 1 tablet by mouth daily., Disp: , Rfl:    Medications ordered in this encounter:  Meds ordered this encounter  Medications   fluticasone (FLONASE) 50 MCG/ACT nasal spray    Sig: Place 2 sprays into both nostrils daily.    Dispense:  16 g    Refill:  0    Order Specific Question:   Supervising Provider    Answer:   Chase Picket [id number]   brompheniramine-pseudoephedrine-DM 30-2-10 MG/5ML syrup    Sig: Take 5 mLs by mouth 4 (four) times daily as needed.    Dispense:  120 mL    Refill:  0     Order Specific Question:   Supervising Provider    Answer:   Chase Picket [id number]   benzonatate (TESSALON) 100 MG capsule    Sig: Take 1 capsule (100 mg total) by mouth 3 (three) times daily as needed.    Dispense:  20 capsule    Refill:  0    Order Specific Question:   Supervising Provider    Answer:   Chase Picket [id number]     *If you need refills on other medications prior to your next appointment, please contact your pharmacy*  Follow-Up: Call back or seek an in-person evaluation if the symptoms worsen or if the condition fails to improve as anticipated.  [facility] [phone]  Care Instructions:   - Continue OTC symptomatic management of choice - Take meds as prescribed - Push fluids - Rest as needed   Isolation Instructions: You are to isolate at home for 5 days from onset of your symptoms. If you must be around other household members who do not have symptoms, you need to make sure that both you and the family members are masking consistently with a high-quality mask.  After day 5 of isolation, if you have had no fever within 24 hours and you are feeling better, you can end isolation but need to mask for an additional 5 days.  After day 5 if you have  a fever or are having significant symptoms, please isolate for full 10 days.  If you note any worsening of symptoms despite treatment, please seek an in-person evaluation ASAP. If you note any significant shortness of breath or any chest pain, please seek ER evaluation. Please do not delay care!   COVID-19: What to Do if You Are Sick If you test positive and are an older adult or someone who is at high risk of getting very sick from COVID-19, treatment may be available. Contact a healthcare provider right away after a positive test to determine if you are eligible, even if your symptoms are mild right now. You can also visit a Test to Treat location and, if eligible, receive a prescription from a  provider. Don't delay: Treatment must be started within the first few days to be effective. If you have a fever, cough, or other symptoms, you might have COVID-19. Most people have mild illness and are able to recover at home. If you are sick: Keep track of your symptoms. If you have an emergency warning sign (including trouble breathing), call 911. Steps to help prevent the spread of COVID-19 if you are sick If you are sick with COVID-19 or think you might have COVID-19, follow the steps below to care for yourself and to help protect other people in your home and community. Stay home except to get medical care Stay home. Most people with COVID-19 have mild illness and can recover at home without medical care. Do not leave your home, except to get medical care. Do not visit public areas and do not go to places where you are unable to wear a mask. Take care of yourself. Get rest and stay hydrated. Take over-the-counter medicines, such as acetaminophen, to help you feel better. Stay in touch with your doctor. Call before you get medical care. Be sure to get care if you have trouble breathing, or have any other emergency warning signs, or if you think it is an emergency. Avoid public transportation, ride-sharing, or taxis if possible. Get tested If you have symptoms of COVID-19, get tested. While waiting for test results, stay away from others, including staying apart from those living in your household. Get tested as soon as possible after your symptoms start. Treatments may be available for people with COVID-19 who are at risk for becoming very sick. Don't delay: Treatment must be started early to be effective--some treatments must begin within 5 days of your first symptoms. Contact your healthcare provider right away if your test result is positive to determine if you are eligible. Self-tests are one of several options for testing for the virus that causes COVID-19 and may be more convenient than  laboratory-based tests and point-of-care tests. Ask your healthcare provider or your local health department if you need help interpreting your test results. You can visit your state, tribal, local, and territorial health department's website to look for the latest local information on testing sites. Separate yourself from other people As much as possible, stay in a specific room and away from other people and pets in your home. If possible, you should use a separate bathroom. If you need to be around other people or animals in or outside of the home, wear a well-fitting mask. Tell your close contacts that they may have been exposed to COVID-19. An infected person can spread COVID-19 starting 48 hours (or 2 days) before the person has any symptoms or tests positive. By letting your close contacts know they may  have been exposed to COVID-19, you are helping to protect everyone. See COVID-19 and Animals if you have questions about pets. If you are diagnosed with COVID-19, someone from the health department may call you. Answer the call to slow the spread. Monitor your symptoms Symptoms of COVID-19 include fever, cough, or other symptoms. Follow care instructions from your healthcare provider and local health department. Your local health authorities may give instructions on checking your symptoms and reporting information. When to seek emergency medical attention Look for emergency warning signs* for COVID-19. If someone is showing any of these signs, seek emergency medical care immediately: Trouble breathing Persistent pain or pressure in the chest New confusion Inability to wake or stay awake Pale, gray, or blue-colored skin, lips, or nail beds, depending on skin tone *This list is not all possible symptoms. Please call your medical provider for any other symptoms that are severe or concerning to you. Call 911 or call ahead to your local emergency facility: Notify the operator that you are seeking  care for someone who has or may have COVID-19. Call ahead before visiting your doctor Call ahead. Many medical visits for routine care are being postponed or done by phone or telemedicine. If you have a medical appointment that cannot be postponed, call your doctor's office, and tell them you have or may have COVID-19. This will help the office protect themselves and other patients. If you are sick, wear a well-fitting mask You should wear a mask if you must be around other people or animals, including pets (even at home). Wear a mask with the best fit, protection, and comfort for you. You don't need to wear the mask if you are alone. If you can't put on a mask (because of trouble breathing, for example), cover your coughs and sneezes in some other way. Try to stay at least 6 feet away from other people. This will help protect the people around you. Masks should not be placed on young children under age 40 years, anyone who has trouble breathing, or anyone who is not able to remove the mask without help. Cover your coughs and sneezes Cover your mouth and nose with a tissue when you cough or sneeze. Throw away used tissues in a lined trash can. Immediately wash your hands with soap and water for at least 20 seconds. If soap and water are not available, clean your hands with an alcohol-based hand sanitizer that contains at least 60% alcohol. Clean your hands often Wash your hands often with soap and water for at least 20 seconds. This is especially important after blowing your nose, coughing, or sneezing; going to the bathroom; and before eating or preparing food. Use hand sanitizer if soap and water are not available. Use an alcohol-based hand sanitizer with at least 60% alcohol, covering all surfaces of your hands and rubbing them together until they feel dry. Soap and water are the best option, especially if hands are visibly dirty. Avoid touching your eyes, nose, and mouth with unwashed  hands. Handwashing Tips Avoid sharing personal household items Do not share dishes, drinking glasses, cups, eating utensils, towels, or bedding with other people in your home. Wash these items thoroughly after using them with soap and water or put in the dishwasher. Clean surfaces in your home regularly Clean and disinfect high-touch surfaces (for example, doorknobs, tables, handles, light switches, and countertops) in your "sick room" and bathroom. In shared spaces, you should clean and disinfect surfaces and items after each use by  the person who is ill. If you are sick and cannot clean, a caregiver or other person should only clean and disinfect the area around you (such as your bedroom and bathroom) on an as needed basis. Your caregiver/other person should wait as long as possible (at least several hours) and wear a mask before entering, cleaning, and disinfecting shared spaces that you use. Clean and disinfect areas that may have blood, stool, or body fluids on them. Use household cleaners and disinfectants. Clean visible dirty surfaces with household cleaners containing soap or detergent. Then, use a household disinfectant. Use a product from [employer] List N: Disinfectants for Coronavirus ([id number]). Be sure to follow the instructions on the label to ensure safe and effective use of the product. Many products recommend keeping the surface wet with a disinfectant for a certain period of time (look at "contact time" on the product label). You may also need to wear personal protective equipment, such as gloves, depending on the directions on the product label. Immediately after disinfecting, wash your hands with soap and water for 20 seconds. For completed guidance on cleaning and disinfecting your home, visit Complete Disinfection Guidance. Take steps to improve ventilation at home Improve ventilation (air flow) at home to help prevent from spreading COVID-19 to other people in your  household. Clear out COVID-19 virus particles in the air by opening windows, using air filters, and turning on fans in your home. Use this interactive tool to learn how to improve air flow in your home. When you can be around others after being sick with COVID-19 Deciding when you can be around others is different for different situations. Find out when you can safely end home isolation. For any additional questions about your care, contact your healthcare provider or state or local health department. 12/31/2020 Content source: [facility] ([facility]), Division of Viral Diseases This information is not intended to replace advice given to you by your health care provider. Make sure you discuss any questions you have with your health care provider. Document Revised: 02/13/2021 Document Reviewed: 02/13/2021 Elsevier Patient Education  2022 [employer].       If you have been instructed to have an in-person evaluation today at a local Urgent Care facility, please use the link below. It will take you to a list of all of our available [facility], including address, phone number and hours of operation. Please do not delay care.  [facility]  If you or a family member do not have a primary care provider, use the link below to schedule a visit and establish care. When you choose a [facility] primary care physician or advanced practice provider, you gain a long-term partner in health. Find a Primary Care Provider  Learn more about [facility]'s in-office and virtual care options: [facility]

## 2023-01-26 DIAGNOSIS — J343 Hypertrophy of nasal turbinates: Secondary | ICD-10-CM | POA: Diagnosis not present

## 2023-01-26 DIAGNOSIS — J330 Polyp of nasal cavity: Secondary | ICD-10-CM | POA: Diagnosis not present

## 2023-01-26 DIAGNOSIS — J310 Chronic rhinitis: Secondary | ICD-10-CM | POA: Diagnosis not present

## 2023-02-15 ENCOUNTER — Ambulatory Visit: Payer: 59 | Admitting: Internal Medicine

## 2023-02-15 ENCOUNTER — Encounter: Payer: Self-pay | Admitting: Internal Medicine

## 2023-02-15 VITALS — BP 120/80 | HR 95 | Temp 98.5°F | Ht 64.0 in | Wt 141.0 lb

## 2023-02-15 DIAGNOSIS — Z0000 Encounter for general adult medical examination without abnormal findings: Secondary | ICD-10-CM

## 2023-02-15 DIAGNOSIS — U071 COVID-19: Secondary | ICD-10-CM

## 2023-02-15 DIAGNOSIS — Z0001 Encounter for general adult medical examination with abnormal findings: Secondary | ICD-10-CM

## 2023-02-15 DIAGNOSIS — E559 Vitamin D deficiency, unspecified: Secondary | ICD-10-CM | POA: Diagnosis not present

## 2023-02-15 DIAGNOSIS — J3089 Other allergic rhinitis: Secondary | ICD-10-CM

## 2023-02-15 DIAGNOSIS — Z87891 Personal history of nicotine dependence: Secondary | ICD-10-CM | POA: Diagnosis not present

## 2023-02-15 DIAGNOSIS — Z13220 Encounter for screening for lipoid disorders: Secondary | ICD-10-CM | POA: Diagnosis not present

## 2023-02-15 LAB — LIPID PANEL
Cholesterol: 130 mg/dL (ref 0–200)
HDL: 39.5 mg/dL (ref >39.00)
LDL Cholesterol: 62 mg/dL (ref 0–99)
NonHDL: 90.37
Total CHOL/HDL Ratio: 3
Triglycerides: 144 mg/dL (ref 0.0–149.0)
VLDL: 28.8 mg/dL (ref 0.0–40.0)

## 2023-02-15 LAB — COMPREHENSIVE METABOLIC PANEL
ALT: 14 U/L (ref 0–35)
AST: 17 U/L (ref 0–37)
Albumin: 4.3 g/dL (ref 3.5–5.2)
Alkaline Phosphatase: 39 U/L (ref 39–117)
BUN: 19 mg/dL (ref 6–23)
CO2: 27 mEq/L (ref 19–32)
Calcium: 9.4 mg/dL (ref 8.4–10.5)
Chloride: 102 mEq/L (ref 96–112)
Creatinine, Ser: 0.72 mg/dL (ref 0.40–1.20)
GFR: 104.78 mL/min (ref >60.00)
Glucose, Bld: 83 mg/dL (ref 70–99)
Potassium: 4 mEq/L (ref 3.5–5.1)
Sodium: 137 mEq/L (ref 135–145)
Total Bilirubin: 0.5 mg/dL (ref 0.2–1.2)
Total Protein: 6.6 g/dL (ref 6.0–8.3)

## 2023-02-15 LAB — CBC
HCT: 37.7 % (ref 36.0–46.0)
Hemoglobin: 13 g/dL (ref 12.0–15.0)
MCHC: 34.5 g/dL (ref 30.0–36.0)
MCV: 93.1 fl (ref 78.0–100.0)
Platelets: 204 10*3/uL (ref 150.0–400.0)
RBC: 4.06 Mil/uL (ref 3.87–5.11)
RDW: 12.9 % (ref 11.5–15.5)
WBC: 7.1 10*3/uL (ref 4.0–10.5)

## 2023-02-15 LAB — VITAMIN D 25 HYDROXY (VIT D DEFICIENCY, FRACTURES): VITD: 25.81 ng/mL — ABNORMAL LOW (ref 30.00–100.00)

## 2023-02-15 LAB — TSH: TSH: 1.29 u[IU]/mL (ref 0.35–5.50)

## 2023-02-15 MED ORDER — FLUTICASONE PROPIONATE 50 MCG/ACT NA SUSP: 2.0000 | Freq: Every day | NASAL | 3 refills | Status: AC

## 2023-02-15 NOTE — Progress Notes (Unsigned)
   Subjective:   Patient ID: Donna Mcdowell, female    DOB: October 01, 1981, 42 y.o.   MRN: [id number]  HPI The patient is a new 42 YO female coming in for pysical  PMH, [facility], social history reviewed and updated  Review of Systems  Constitutional: Negative.   HENT: Negative.    Eyes: Negative.   Respiratory:  Negative for cough, chest tightness and shortness of breath.   Cardiovascular:  Negative for chest pain, palpitations and leg swelling.  Gastrointestinal:  Negative for abdominal distention, abdominal pain, constipation, diarrhea, nausea and vomiting.  Musculoskeletal: Negative.   Skin: Negative.   Neurological: Negative.   Psychiatric/Behavioral: Negative.      Objective:  Physical Exam Constitutional:      Appearance: She is well-developed.  HENT:     Head: Normocephalic and atraumatic.  Cardiovascular:     Rate and Rhythm: Normal rate and regular rhythm.  Pulmonary:     Effort: Pulmonary effort is normal. No respiratory distress.     Breath sounds: Normal breath sounds. No wheezing or rales.  Abdominal:     General: Bowel sounds are normal. There is no distension.     Palpations: Abdomen is soft.     Tenderness: There is no abdominal tenderness. There is no rebound.  Musculoskeletal:     Cervical back: Normal range of motion.  Skin:    General: Skin is warm and dry.  Neurological:     Mental Status: She is alert and oriented to person, place, and time.     Coordination: Coordination normal.     Vitals:   02/15/23 1318  BP: 120/80  Pulse: 95  Temp: 98.5 F (36.9 C)  TempSrc: Oral  SpO2: 96%  Weight: 141 lb (64 kg)  Height: 5\' 4"  (1.626 m)    Assessment & Plan:

## 2023-02-18 DIAGNOSIS — E559 Vitamin D deficiency, unspecified: Secondary | ICD-10-CM | POA: Insufficient documentation

## 2023-02-18 DIAGNOSIS — Z0000 Encounter for general adult medical examination without abnormal findings: Secondary | ICD-10-CM | POA: Insufficient documentation

## 2023-02-18 DIAGNOSIS — J309 Allergic rhinitis, unspecified: Secondary | ICD-10-CM | POA: Insufficient documentation

## 2023-02-18 NOTE — Assessment & Plan Note (Signed)
Rx flonase

## 2023-02-18 NOTE — Assessment & Plan Note (Signed)
Encouraged lifelong cessation.

## 2023-02-18 NOTE — Assessment & Plan Note (Signed)
Flu shot yearly. Tetanus up to date. Mammogram up to date, pap smear up to date. Counseled about sun safety and mole surveillance. Counseled about the dangers of distracted driving. Given 10 year screening recommendations.   

## 2023-02-18 NOTE — Assessment & Plan Note (Signed)
Checking vitamind D and adjust as needed oral otc supplement.

## 2023-06-24 ENCOUNTER — Telehealth: Payer: Self-pay | Admitting: Internal Medicine

## 2023-06-24 ENCOUNTER — Telehealth (INDEPENDENT_AMBULATORY_CARE_PROVIDER_SITE_OTHER): Payer: 59 | Admitting: Internal Medicine

## 2023-06-24 ENCOUNTER — Encounter: Payer: Self-pay | Admitting: Internal Medicine

## 2023-06-24 DIAGNOSIS — R42 Dizziness and giddiness: Secondary | ICD-10-CM | POA: Diagnosis not present

## 2023-06-24 LAB — COMPREHENSIVE METABOLIC PANEL
ALT: 12 U/L (ref 0–35)
AST: 14 U/L (ref 0–37)
Albumin: 4.1 g/dL (ref 3.5–5.2)
Alkaline Phosphatase: 37 U/L — ABNORMAL LOW (ref 39–117)
BUN: 20 mg/dL (ref 6–23)
CO2: 29 meq/L (ref 19–32)
Calcium: 9.2 mg/dL (ref 8.4–10.5)
Chloride: 102 meq/L (ref 96–112)
Creatinine, Ser: 0.81 mg/dL (ref 0.40–1.20)
GFR: 90.74 mL/min (ref >60.00)
Glucose, Bld: 79 mg/dL (ref 70–99)
Potassium: 3.8 meq/L (ref 3.5–5.1)
Sodium: 138 meq/L (ref 135–145)
Total Bilirubin: 0.4 mg/dL (ref 0.2–1.2)
Total Protein: 6.3 g/dL (ref 6.0–8.3)

## 2023-06-24 LAB — VITAMIN B12: Vitamin B-12: 804 pg/mL (ref 211–911)

## 2023-06-24 LAB — CBC
HCT: 36.1 % (ref 36.0–46.0)
Hemoglobin: 12.1 g/dL (ref 12.0–15.0)
MCHC: 33.4 g/dL (ref 30.0–36.0)
MCV: 94.4 fl (ref 78.0–100.0)
Platelets: 215 10*3/uL (ref 150.0–400.0)
RBC: 3.82 Mil/uL — ABNORMAL LOW (ref 3.87–5.11)
RDW: 12.7 % (ref 11.5–15.5)
WBC: 6.6 10*3/uL (ref 4.0–10.5)

## 2023-06-24 LAB — TSH: TSH: 1.43 u[IU]/mL (ref 0.35–5.50)

## 2023-06-24 LAB — FERRITIN: Ferritin: 32.1 ng/mL (ref 10.0–291.0)

## 2023-06-24 LAB — VITAMIN D 25 HYDROXY (VIT D DEFICIENCY, FRACTURES): VITD: 31.17 ng/mL (ref 30.00–100.00)

## 2023-06-24 NOTE — Telephone Encounter (Signed)
Patient states she discussed doing lab work during her virtual visit, she came in to do them but lab orders had not been put in. She asked if the lab orders could be place and she be given a call so she can return to do them. Best callback is [phone].

## 2023-06-24 NOTE — Assessment & Plan Note (Signed)
Checking CBC, CMP, TSH, vitamin D, B12, ferritin. She has had this in the past and MRI done at the time was normal. Adjust as needed. No recent illness, medication changes otc changes, diet/exercise changes, change in periods.

## 2023-06-24 NOTE — Telephone Encounter (Signed)
Called pt and informed her that he labs have been put in

## 2023-06-24 NOTE — Progress Notes (Signed)
Virtual Visit via Video Note  I connected with Donna Mcdowell on 06/24/23 at 11:00 AM EDT by a video enabled telemedicine application and verified that I am speaking with the correct person using two identifiers.  The patient and the provider were at separate locations throughout the entire encounter. Patient location: home, Provider location: work   I discussed the limitations of evaluation and management by telemedicine and the availability of in person appointments. The patient expressed understanding and agreed to proceed. The patient and the provider were the only parties present for the visit unless noted in HPI below.  History of Present Illness: The patient is a 42 y.o. female with visit for dizziness over labor day. Drank some water and resolved. Yesterday walking got sensation again and twice that day.  Observations/Objective: Appearance: normal, breathing appears normal, casual grooming, A and O times 3  Assessment and Plan: See problem oriented charting  Follow Up Instructions: check labs  I discussed the assessment and treatment plan with the patient. The patient was provided an opportunity to ask questions and all were answered. The patient agreed with the plan and demonstrated an understanding of the instructions.   The patient was advised to call back or seek an in-person evaluation if the symptoms worsen or if the condition fails to improve as anticipated.  Myrlene Broker, MD

## 2023-06-28 ENCOUNTER — Telehealth: Payer: Self-pay | Admitting: Internal Medicine

## 2023-06-28 NOTE — Telephone Encounter (Signed)
Those are both normal. Can try to increase electrolyte drinks instead of just water as a liquid.

## 2023-06-28 NOTE — Telephone Encounter (Signed)
Patient agreed to electrolytes

## 2023-06-28 NOTE — Telephone Encounter (Signed)
Pt states that she is still having dizziness but not as sever but she also has concerns about her RBC and Alkaline being low. Should she be concerned or is there any concerns

## 2023-06-28 NOTE — Telephone Encounter (Signed)
Pt called wanting to express that she is still having dizziness and wanting to speak with a nurse please advise

## 2023-07-27 ENCOUNTER — Ambulatory Visit (INDEPENDENT_AMBULATORY_CARE_PROVIDER_SITE_OTHER): Payer: 59 | Admitting: Otolaryngology

## 2023-07-27 ENCOUNTER — Encounter (INDEPENDENT_AMBULATORY_CARE_PROVIDER_SITE_OTHER): Payer: Self-pay

## 2023-07-27 VITALS — Ht 64.0 in | Wt 132.0 lb

## 2023-07-27 DIAGNOSIS — H6983 Other specified disorders of Eustachian tube, bilateral: Secondary | ICD-10-CM | POA: Diagnosis not present

## 2023-07-27 DIAGNOSIS — J310 Chronic rhinitis: Secondary | ICD-10-CM

## 2023-07-28 DIAGNOSIS — H6983 Other specified disorders of Eustachian tube, bilateral: Secondary | ICD-10-CM | POA: Insufficient documentation

## 2023-07-28 DIAGNOSIS — J310 Chronic rhinitis: Secondary | ICD-10-CM | POA: Insufficient documentation

## 2023-07-28 NOTE — Progress Notes (Signed)
Patient ID: Donna Mcdowell, female   DOB: 12-28-1980, 42 y.o.   MRN: [id number]  Follow up: Left nasal inverted papilloma, chronic nasal congestion, eustachian tube dysfunction  HPI: The patient is a 42 year old female who returns today for her follow-up evaluation. The patient has a history of left nasal inverting papilloma. She underwent extensive resection of the nasal papilloma in 03/2021. According to the patient, she has been doing well. She has not noted any recurrent lesions. She is experiencing occasional nasal congestion and clogging sensation in her ears.  She is currently using Flonase nasal spray.  She denies any facial pain, fever, or visual change.  Exam: General: Communicates without difficulty, well nourished, no acute distress. Head: Normocephalic, no evidence injury, no tenderness, facial buttresses intact without stepoff. Face/sinus: No tenderness to palpation and percussion. Facial movement is normal and symmetric. Eyes: PERRL, EOMI. No scleral icterus, conjunctivae clear. Neuro: CN II exam reveals vision grossly intact.  No nystagmus at any point of gaze. Ears: Auricles well formed without lesions.  Ear canals are intact without mass or lesion.  No erythema or edema is appreciated.  The TMs are intact without fluid. Nose: External evaluation reveals normal support and skin without lesions.  Dorsum is intact.  Anterior rhinoscopy reveals congested mucosa over anterior aspect of inferior turbinates and intact septum.  No recurrent papilloma noted.  Oral:  Oral cavity and oropharynx are intact, symmetric, without erythema or edema.  Mucosa is moist without lesions. Neck: Full range of motion without pain.  There is no significant lymphadenopathy.  No masses palpable.  Thyroid bed within normal limits to palpation.  Parotid glands and submandibular glands equal bilaterally without mass.  Trachea is midline. Neuro:  CN 2-12 grossly intact.    Assessment: 1.  No recurrent papilloma is  noted in the left nasal cavity. 2.  Chronic/allergic rhinitis. 3.  Clinical eustachian tube dysfunction.  Plan: 1.  The physical exam findings are reviewed with the patient. 2.  Continue with Flonase nasal spray daily. 3.  Valsalva exercise multiple times a day. 4.  The patient will return for reevaluation in 1 year.

## 2023-09-04 ENCOUNTER — Telehealth: Payer: 59 | Admitting: Family Medicine

## 2023-09-04 DIAGNOSIS — J40 Bronchitis, not specified as acute or chronic: Secondary | ICD-10-CM | POA: Diagnosis not present

## 2023-09-04 MED ORDER — AZITHROMYCIN 250 MG PO TABS: ORAL_TABLET | ORAL | 0 refills | Status: AC

## 2023-09-04 NOTE — Patient Instructions (Signed)

## 2023-09-04 NOTE — Progress Notes (Signed)
Virtual Visit Consent   SIGRID LEAP, you are scheduled for a virtual visit with a [facility] provider today. Just as with appointments in the office, your consent must be obtained to participate. Your consent will be active for this visit and any virtual visit you may have with one of our providers in the next 365 days. If you have a MyChart account, a copy of this consent can be sent to you electronically.  As this is a virtual visit, video technology does not allow for your provider to perform a traditional examination. This may limit your provider's ability to fully assess your condition. If your provider identifies any concerns that need to be evaluated in person or the need to arrange testing (such as labs, EKG, etc.), we will make arrangements to do so. Although advances in technology are sophisticated, we cannot ensure that it will always work on either your end or our end. If the connection with a video visit is poor, the visit may have to be switched to a telephone visit. With either a video or telephone visit, we are not always able to ensure that we have a secure connection.  By engaging in this virtual visit, you consent to the provision of healthcare and authorize for your insurance to be billed (if applicable) for the services provided during this visit. Depending on your insurance coverage, you may receive a charge related to this service.  I need to obtain your verbal consent now. Are you willing to proceed with your visit today? Donna Mcdowell has provided verbal consent on 09/04/2023 for a virtual visit (video or telephone). Georgana Curio, FNP  Date: 09/04/2023 8:51 AM  Virtual Visit via Video Note   I, Georgana Curio, connected with  Donna Mcdowell  ([id number], 09-04-1981) on 09/04/23 at  8:45 AM EST by a video-enabled telemedicine application and verified that I am speaking with the correct person using two identifiers.  Location: Patient: Virtual Visit Location Patient:  Home Provider: Virtual Visit Location Provider: Home Office   I discussed the limitations of evaluation and management by telemedicine and the availability of in person appointments. The patient expressed understanding and agreed to proceed.    History of Present Illness: Donna Mcdowell is a 42 y.o. who identifies as a female who was assigned female at birth, and is being seen today for cough, tightness in chest, yellow and green mucus, no fever. Family sick with same illness dx as pneumonia. Sx worsening through the week. 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 05/13/2016    Allergies:  Allergies  Allergen Reactions   Oxycodone Nausea And Vomiting   Medications:  Current Outpatient Medications:    azithromycin (ZITHROMAX) 250 MG tablet, Take 2 tablets on day 1, then 1 tablet daily on days 2 through 5, Disp: 6 tablet, Rfl: 0   acetaminophen (TYLENOL) 500 MG tablet, Take 1,000 mg by mouth every 6 (six) hours as needed., Disp: , Rfl:    Cholecalciferol (D3 PO), Take by mouth., Disp: ,  Rfl:    Cyanocobalamin (B-12 PO), Take by mouth., Disp: , Rfl:    fexofenadine (ALLEGRA) 180 MG tablet, Take 180 mg by mouth daily., Disp: , Rfl:    fluticasone (FLONASE) 50 MCG/ACT nasal spray, Place 2 sprays into both nostrils daily., Disp: 48 g, Rfl: 3   Ibuprofen (ADVIL PO), Take by mouth as needed., Disp: , Rfl:    Multiple Vitamin (MULTIVITAMIN) tablet, Take 1 tablet by mouth daily., Disp: , Rfl:    Observations/Objective: Patient is well-developed, well-nourished in no acute distress.  Resting comfortably  at home.  Head is normocephalic, atraumatic.  No labored breathing.  Speech is clear and coherent with logical content.  Patient is alert and oriented at baseline.    Assessment and Plan: 1. Bronchitis  Increase fluids,humidifier at night, UC if sx persist or worsen.   Follow Up Instructions: I discussed the assessment and treatment plan with the patient. The patient was provided an opportunity to ask questions and all were answered. The patient agreed with the plan and demonstrated an understanding of the instructions.  A copy of instructions were sent to the patient via MyChart unless otherwise noted below.     The patient was advised to call back or seek an in-person evaluation if the symptoms worsen or if the condition fails to improve as anticipated.    Georgana Curio, FNP

## 2023-10-07 DIAGNOSIS — M25562 Pain in left knee: Secondary | ICD-10-CM | POA: Diagnosis not present

## 2023-10-08 DIAGNOSIS — M25562 Pain in left knee: Secondary | ICD-10-CM | POA: Diagnosis not present

## 2023-10-26 DIAGNOSIS — M25562 Pain in left knee: Secondary | ICD-10-CM | POA: Diagnosis not present

## 2024-02-07 DIAGNOSIS — D2272 Melanocytic nevi of left lower limb, including hip: Secondary | ICD-10-CM | POA: Diagnosis not present

## 2024-02-07 DIAGNOSIS — D2261 Melanocytic nevi of right upper limb, including shoulder: Secondary | ICD-10-CM | POA: Diagnosis not present

## 2024-03-03 DIAGNOSIS — Z6822 Body mass index (BMI) 22.0-22.9, adult: Secondary | ICD-10-CM | POA: Diagnosis not present

## 2024-03-03 DIAGNOSIS — Z713 Dietary counseling and surveillance: Secondary | ICD-10-CM | POA: Diagnosis not present

## 2024-03-03 DIAGNOSIS — Z8249 Family history of ischemic heart disease and other diseases of the circulatory system: Secondary | ICD-10-CM | POA: Diagnosis not present

## 2024-03-13 ENCOUNTER — Telehealth: Payer: Self-pay | Admitting: Podiatry

## 2024-03-13 ENCOUNTER — Encounter: Payer: Self-pay | Admitting: Podiatry

## 2024-03-13 ENCOUNTER — Ambulatory Visit: Admitting: Podiatry

## 2024-03-13 VITALS — Ht 64.0 in | Wt 132.0 lb

## 2024-03-13 DIAGNOSIS — L600 Ingrowing nail: Secondary | ICD-10-CM

## 2024-03-13 MED ORDER — DOXYCYCLINE HYCLATE 100 MG PO TABS: 100.0000 mg | ORAL_TABLET | Freq: Two times a day (BID) | ORAL | 1 refills | Status: DC

## 2024-03-13 NOTE — Telephone Encounter (Signed)
 Pt forgot to update pharmacy information at her appointment today. The script for doxycycline  needs to be sent to [facility] [address] [phone]

## 2024-03-13 NOTE — Patient Instructions (Signed)

## 2024-03-14 ENCOUNTER — Other Ambulatory Visit: Payer: Self-pay

## 2024-03-14 MED ORDER — DOXYCYCLINE HYCLATE 100 MG PO TABS: 100.0000 mg | ORAL_TABLET | Freq: Two times a day (BID) | ORAL | 1 refills | Status: DC

## 2024-03-15 NOTE — Progress Notes (Signed)
 Subjective:   Patient ID: Donna Mcdowell, female   DOB: 43 y.o.   MRN: [id number]   HPI Patient states she developed a lot of pain in her left big toenail and does not remember injury.  States has been getting red and inflamed and somewhat hard to wear shoe gear with it.  Patient does not smoke likes to be active   Review of Systems  All other systems reviewed and are negative.       Objective:  Physical Exam Vitals and nursing note reviewed.  Constitutional:      Appearance: She is well-developed.  Pulmonary:     Effort: Pulmonary effort is normal.  Musculoskeletal:        General: Normal range of motion.  Skin:    General: Skin is warm.  Neurological:     Mental Status: She is alert.     Neurovascular status intact muscle strength adequate range of motion adequate with an incurvated medial border left hallux slight proximal redness to this area with slight discomfort across the entire nailbed but mostly centered on the medial side.  Good digital perfusion well-oriented     Assessment:  1 toenail deformity left hallux medial side with slight inflamed redness noted     Plan:  H&P the condition reviewed with patient.  I recommended removing the medial border exploring the area and probable application of chemical and today I went ahead and anesthetized 60 mg like Marcaine Xylocaine  mixture patient signed consent for I remove the medial border it looks clean underneath I flushed it out I applied chemical phenol 3 applications 36 all alcohol sterile dressing gave instructions on soaks wear dressing 24 hours take it off earlier if throbbing were to occur and I placed her on doxycycline  precautionary measure for 7 days

## 2024-03-17 DIAGNOSIS — Z8249 Family history of ischemic heart disease and other diseases of the circulatory system: Secondary | ICD-10-CM | POA: Diagnosis not present

## 2024-03-17 DIAGNOSIS — Z713 Dietary counseling and surveillance: Secondary | ICD-10-CM | POA: Diagnosis not present

## 2024-03-17 DIAGNOSIS — Z6822 Body mass index (BMI) 22.0-22.9, adult: Secondary | ICD-10-CM | POA: Diagnosis not present

## 2024-04-25 ENCOUNTER — Ambulatory Visit: Admitting: Obstetrics and Gynecology

## 2024-04-25 NOTE — Progress Notes (Incomplete)
 [name] here for annual exam. Married.  No LMP recorded.    She reports ***. Urine sample provided: ***  Abnormal bleeding: *** Pelvic discharge or pain: *** Breast mass, nipple discharge or skin changes : ***  Sexually active: *** Birth control: *** Last PAP:     Component Value Date/Time   DIAGPAP  10/23/2020 1139    - Negative for intraepithelial lesion or malignancy (NILM)   DIAGPAP  09/23/2017 0000    NEGATIVE FOR INTRAEPITHELIAL LESIONS OR MALIGNANCY.   HPVHIGH Negative 10/23/2020 1139   ADEQPAP  10/23/2020 1139    Satisfactory for evaluation; transformation zone component PRESENT.   ADEQPAP  09/23/2017 0000    Satisfactory for evaluation  endocervical/transformation zone component PRESENT.   Last mammogram: 01/02/22 [facility]ads 1 Last colonoscopy: never   Exercising: *** Smoker: ***  Flowsheet Row Office Visit from [facility]/15/2023 in [facility] [place]  PHQ-2 Total Score 0    Flowsheet Row Office Visit from 02/15/2023 in [facility]  PHQ-9 Total Score 0     GYN HISTORY: ***  OB History  Gravida Para Term Preterm AB Living  0       SAB IAB Ectopic Multiple Live Births         Past Medical History:  Diagnosis Date   Carbuncle of face    forehead   Deviated septum 07/2018   Family history of breast cancer 12/26/2021   Nasal mass 07/2018   Nasal turbinate hypertrophy 07/2018   bilateral   Past Surgical History:  Procedure Laterality Date   EXCISION NASAL MASS Left 08/30/2018   Procedure: EXCISION OF NASAL MASS;  Surgeon: Karis Clunes, MD;  Location: [facility];  Service: ENT;  Laterality: Left;   EXCISION NASAL MASS Left 03/18/2021   Procedure: EXCISION LEFT NASAL INVERTED PAPILLOMAS;  Surgeon: Karis Clunes, MD;  Location: [facility];  Service: ENT;  Laterality: Left;   FINGER TENDON REPAIR Right 06/23/2010   long finger   INCISION AND DRAINAGE BREAST ABSCESS Left  06/09/2011   NASAL SEPTOPLASTY W/ TURBINOPLASTY Bilateral 08/30/2018   Procedure: NASAL SEPTOPLASTY WITH TURBINATE REDUCTION;  Surgeon: Karis Clunes, MD;  Location: [facility];  Service: ENT;  Laterality: Bilateral;   TRIGGER FINGER RELEASE Right 06/23/2010   long finger   WISDOM TOOTH EXTRACTION     Current Outpatient Medications on File Prior to Visit  Medication Sig Dispense Refill   acetaminophen  (TYLENOL ) 500 MG tablet Take 1,000 mg by mouth every 6 (six) hours as needed.     Cholecalciferol (D3 PO) Take by mouth.     Cyanocobalamin  (B-12 PO) Take by mouth.     doxycycline  (VIBRA -TABS) 100 MG tablet Take 1 tablet (100 mg total) by mouth 2 (two) times daily. 16 tablet 1   fexofenadine  (ALLEGRA ) 180 MG tablet Take 180 mg by mouth daily.     fluticasone  (FLONASE ) 50 MCG/ACT nasal spray Place 2 sprays into both nostrils daily. 48 g 3   Ibuprofen (ADVIL PO) Take by mouth as needed.     Multiple Vitamin (MULTIVITAMIN) tablet Take 1 tablet by mouth daily.     No current facility-administered medications on file prior to visit.   Social History   Socioeconomic History   Marital status: Married    Spouse name: Not on file   Number of children: Not on file   Years of education: Not on file   Highest education  level: Not on file  Occupational History   Not on file  Tobacco Use   Smoking status: Former    Current packs/day: 1.00    Average packs/day: 1 pack/day for 19.0 years (19.0 ttl pk-yrs)    Types: Cigarettes   Smokeless tobacco: Never  Vaping Use   Vaping status: Every Day   Substances: Nicotine  Substance and Sexual Activity   Alcohol use: Yes    Comment: Drinks 2 drinks a day   Drug use: No   Sexual activity: Yes    Partners: Female    Comment: female partner  Other Topics Concern   Not on file  Social History Narrative   Not on file   Social Drivers of Health   Financial Resource Strain: Not on file  Food Insecurity: Not on file  Transportation  Needs: Not on file  Physical Activity: Not on file  Stress: Not on file  Social Connections: Not on file  Intimate Partner Violence: Not on file   Family History  Problem Relation Age of Onset   Breast cancer Mother 48   Colon polyps Mother        unknown number   Osteoarthritis Maternal Grandmother    Lung cancer Maternal Grandmother        dx 60s   Osteoarthritis Maternal Grandfather    Heart failure Maternal Grandfather    Allergies  Allergen Reactions   Oxycodone Nausea And Vomiting     PE There were no vitals filed for this visit. There is no height or weight on file to calculate BMI.  Physical Exam    Assessment and Plan:        There are no diagnoses linked to this encounter. Clotilda FORBES Pa, CMA

## 2024-04-29 ENCOUNTER — Ambulatory Visit: Admission: EM | Admit: 2024-04-29 | Discharge: 2024-04-29 | Disposition: A | Discharge status: Home / Self Care

## 2024-04-29 DIAGNOSIS — B349 Viral infection, unspecified: Secondary | ICD-10-CM | POA: Diagnosis not present

## 2024-04-29 LAB — POCT RAPID STREP A (OFFICE): Rapid Strep A Screen: NEGATIVE

## 2024-04-29 LAB — POC SARS CORONAVIRUS 2 AG -  ED: SARS Coronavirus 2 Ag: NEGATIVE

## 2024-04-29 LAB — POCT INFLUENZA A/B
Influenza A, POC: NEGATIVE
Influenza B, POC: NEGATIVE

## 2024-04-29 MED ORDER — IBUPROFEN 600 MG PO TABS: 600.0000 mg | ORAL_TABLET | Freq: Four times a day (QID) | ORAL | 0 refills | Status: AC | PRN

## 2024-04-29 MED ORDER — PROMETHAZINE-DM 6.25-15 MG/5ML PO SYRP: 5.0000 mL | ORAL_SOLUTION | Freq: Four times a day (QID) | ORAL | 0 refills | Status: DC | PRN

## 2024-04-29 MED ORDER — AZELASTINE HCL 0.1 % NA SOLN
1.0000 | Freq: Two times a day (BID) | NASAL | 1 refills | Status: AC
Start: 2024-04-29 — End: ?

## 2024-04-29 NOTE — ED Triage Notes (Signed)
 This started with sinus stuff/congestion x2 days ago, yesterday very under water feeling in my ears, last night I woke up with congestion/cough and felt pretty rough, no fever known.

## 2024-04-29 NOTE — Discharge Instructions (Signed)
  1. Acute viral syndrome (Primary) - POCT Influenza A/B performed in UC is negative for influenza - POC SARS Coronavirus 2 Ag-ED - Nasal Swab performed in UC is negative for coronavirus - POCT rapid strep A performed in UC is negative for strep pharyngitis - azelastine  (ASTELIN ) 0.1 % nasal spray; Place 1 spray into both nostrils 2 (two) times daily. Use in each nostril as directed  Dispense: 30 mL; Refill: 1 - promethazine -dextromethorphan (PROMETHAZINE -DM) 6.25-15 MG/5ML syrup; Take 5 mLs by mouth 4 (four) times daily as needed for cough.  Dispense: 240 mL; Refill: 0 - ibuprofen  (ADVIL ) 600 MG tablet; Take 1 tablet (600 mg total) by mouth every 6 (six) hours as needed.  Dispense: 30 tablet; Refill: 0 -Continue to monitor symptoms for any change in severity if there is any escalation of current symptoms or development of new symptoms follow-up in ER for further evaluation and management.

## 2024-04-29 NOTE — ED Provider Notes (Signed)
 [facility]  Note:  This document was prepared using Conservation officer, historic buildings and may include unintentional dictation errors.  MRN: [id number] DOB: Jul 12, 1981  Subjective:   Donna Mcdowell is a 43 y.o. female presenting for nasal congestion, postnasal drip, cough, body aches, ear fullness x 2 days.  Patient reports that symptoms became much worse last night.  Denies any known fever or chills, no shortness of breath or chest pain.  Patient reports that cough feels like it is in her chest and there is chest congestion.  No known sick exposure.  Patient has not taken any over-the-counter medication to treat symptoms.  No current facility-administered medications for this encounter.  Current Outpatient Medications:    acetaminophen  (TYLENOL ) 500 MG tablet, Take 1,000 mg by mouth every 6 (six) hours as needed., Disp: , Rfl:    azelastine  (ASTELIN ) 0.1 % nasal spray, Place 1 spray into both nostrils 2 (two) times daily. Use in each nostril as directed, Disp: 30 mL, Rfl: 1   Cholecalciferol (D3 PO), Take by mouth., Disp: , Rfl:    Cyanocobalamin  (B-12 PO), Take by mouth., Disp: , Rfl:    fexofenadine  (ALLEGRA ) 180 MG tablet, Take 180 mg by mouth daily., Disp: , Rfl:    fluticasone  (FLONASE ) 50 MCG/ACT nasal spray, Place 2 sprays into both nostrils daily., Disp: 48 g, Rfl: 3   ibuprofen  (ADVIL ) 600 MG tablet, Take 1 tablet (600 mg total) by mouth every 6 (six) hours as needed., Disp: 30 tablet, Rfl: 0   Multiple Vitamin (MULTIVITAMIN) tablet, Take 1 tablet by mouth daily., Disp: , Rfl:    promethazine -dextromethorphan (PROMETHAZINE -DM) 6.25-15 MG/5ML syrup, Take 5 mLs by mouth 4 (four) times daily as needed for cough., Disp: 240 mL, Rfl: 0   doxycycline  (VIBRA -TABS) 100 MG tablet, Take 1 tablet (100 mg total) by mouth 2 (two) times daily., Disp: 16 tablet, Rfl: 1   Allergies  Allergen Reactions   Oxycodone Nausea And Vomiting    Past Medical History:  Diagnosis Date    Carbuncle of face    forehead   Deviated septum 07/2018   Family history of breast cancer 12/26/2021   Nasal mass 07/2018   Nasal turbinate hypertrophy 07/2018   bilateral     Past Surgical History:  Procedure Laterality Date   EXCISION NASAL MASS Left 08/30/2018   Procedure: EXCISION OF NASAL MASS;  Surgeon: Karis Clunes, MD;  Location: [facility];  Service: ENT;  Laterality: Left;   EXCISION NASAL MASS Left 03/18/2021   Procedure: EXCISION LEFT NASAL INVERTED PAPILLOMAS;  Surgeon: Karis Clunes, MD;  Location: [facility];  Service: ENT;  Laterality: Left;   FINGER TENDON REPAIR Right 06/23/2010   long finger   INCISION AND DRAINAGE BREAST ABSCESS Left 06/09/2011   NASAL SEPTOPLASTY W/ TURBINOPLASTY Bilateral 08/30/2018   Procedure: NASAL SEPTOPLASTY WITH TURBINATE REDUCTION;  Surgeon: Karis Clunes, MD;  Location: [facility];  Service: ENT;  Laterality: Bilateral;   TRIGGER FINGER RELEASE Right 06/23/2010   long finger   WISDOM TOOTH EXTRACTION      Family History  Problem Relation Age of Onset   Breast cancer Mother 31   Colon polyps Mother        unknown number   Osteoarthritis Maternal Grandmother    Lung cancer Maternal Grandmother        dx 40s   Osteoarthritis Maternal Grandfather    Heart failure Maternal Grandfather     Social History  Tobacco Use   Smoking status: Former    Current packs/day: 1.00    Average packs/day: 1 pack/day for 19.0 years (19.0 ttl pk-yrs)    Types: Cigarettes   Smokeless tobacco: Never  Vaping Use   Vaping status: Every Day   Substances: Nicotine  Substance Use Topics   Alcohol use: Yes    Comment: Drinks 2 drinks a day   Drug use: No    ROS Refer to HPI for ROS details.  Objective:   Vitals: BP (!) 91/59 (BP Location: Right Arm)   Pulse 64   Temp 98.1 F (36.7 C) (Oral)   Resp 18   Ht 5' 4 (1.626 m)   Wt 130 lb (59 kg)   LMP 04/21/2024 (Exact Date)   SpO2 96%   BMI 22.31 kg/m    Physical Exam Vitals and nursing note reviewed.  Constitutional:      General: She is not in acute distress.    Appearance: Normal appearance. She is well-developed. She is not ill-appearing or toxic-appearing.  HENT:     Head: Normocephalic and atraumatic.     Right Ear: Tympanic membrane, ear canal and external ear normal.     Left Ear: Tympanic membrane, ear canal and external ear normal.     Nose: Congestion present. No rhinorrhea.     Mouth/Throat:     Mouth: Mucous membranes are moist.     Pharynx: Oropharynx is clear. No oropharyngeal exudate or posterior oropharyngeal erythema.  Eyes:     General:        Right eye: No discharge.        Left eye: No discharge.     Extraocular Movements: Extraocular movements intact.     Conjunctiva/sclera: Conjunctivae normal.  Cardiovascular:     Rate and Rhythm: Normal rate and regular rhythm.     Heart sounds: Normal heart sounds. No murmur heard. Pulmonary:     Effort: Pulmonary effort is normal. No respiratory distress.     Breath sounds: Normal breath sounds. No stridor. No wheezing, rhonchi or rales.  Chest:     Chest wall: No tenderness.  Skin:    General: Skin is warm and dry.  Neurological:     General: No focal deficit present.     Mental Status: She is alert and oriented to person, place, and time.  Psychiatric:        Mood and Affect: Mood normal.        Behavior: Behavior normal.     Procedures  Results for orders placed or performed during the hospital encounter of 04/29/24 (from the past 24 hours)  POCT Influenza A/B     Status: Normal   Collection Time: 04/29/24  8:44 AM  Result Value Ref Range   Influenza A, POC Negative Negative   Influenza B, POC Negative Negative  POCT rapid strep A     Status: Normal   Collection Time: 04/29/24  8:44 AM  Result Value Ref Range   Rapid Strep A Screen Negative Negative  POC SARS Coronavirus 2 Ag-ED - Nasal Swab     Status: Normal   Collection Time: 04/29/24  8:57 AM   Result Value Ref Range   SARS Coronavirus 2 Ag Negative Negative    No results found.   Assessment and Plan :     Discharge Instructions       1. Acute viral syndrome (Primary) - POCT Influenza A/B performed in UC is negative for influenza - POC SARS Coronavirus 2  Ag-ED - Nasal Swab performed in UC is negative for coronavirus - POCT rapid strep A performed in UC is negative for strep pharyngitis - azelastine  (ASTELIN ) 0.1 % nasal spray; Place 1 spray into both nostrils 2 (two) times daily. Use in each nostril as directed  Dispense: 30 mL; Refill: 1 - promethazine -dextromethorphan (PROMETHAZINE -DM) 6.25-15 MG/5ML syrup; Take 5 mLs by mouth 4 (four) times daily as needed for cough.  Dispense: 240 mL; Refill: 0 - ibuprofen  (ADVIL ) 600 MG tablet; Take 1 tablet (600 mg total) by mouth every 6 (six) hours as needed.  Dispense: 30 tablet; Refill: 0 -Continue to monitor symptoms for any change in severity if there is any escalation of current symptoms or development of new symptoms follow-up in ER for further evaluation and management.       Izamar Linden B Selene Peltzer   Venia Riveron, [place] B, [place] 04/29/24 [phone]

## 2024-05-11 ENCOUNTER — Other Ambulatory Visit (HOSPITAL_COMMUNITY)
Admission: RE | Admit: 2024-05-11 | Discharge: 2024-05-11 | Disposition: A | Discharge status: Home / Self Care | Source: Ambulatory Visit | Attending: Obstetrics and Gynecology | Admitting: Obstetrics and Gynecology

## 2024-05-11 ENCOUNTER — Encounter: Payer: Self-pay | Admitting: Obstetrics and Gynecology

## 2024-05-11 ENCOUNTER — Ambulatory Visit (INDEPENDENT_AMBULATORY_CARE_PROVIDER_SITE_OTHER): Admitting: Obstetrics and Gynecology

## 2024-05-11 VITALS — BP 120/76 | HR 73 | Temp 97.9°F | Ht 64.0 in | Wt 133.3 lb

## 2024-05-11 DIAGNOSIS — Z124 Encounter for screening for malignant neoplasm of cervix: Secondary | ICD-10-CM

## 2024-05-11 DIAGNOSIS — Z1331 Encounter for screening for depression: Secondary | ICD-10-CM

## 2024-05-11 DIAGNOSIS — Z9189 Other specified personal risk factors, not elsewhere classified: Secondary | ICD-10-CM | POA: Insufficient documentation

## 2024-05-11 DIAGNOSIS — Z01419 Encounter for gynecological examination (general) (routine) without abnormal findings: Secondary | ICD-10-CM | POA: Insufficient documentation

## 2024-05-11 DIAGNOSIS — Z1231 Encounter for screening mammogram for malignant neoplasm of breast: Secondary | ICD-10-CM

## 2024-05-11 NOTE — Assessment & Plan Note (Signed)
 Cervical cancer screening performed according to ASCCP guidelines. Encouraged annual mammogram and MRI screening Labs and immunizations with her primary Encouraged safe sexual practices as indicated Encouraged healthy lifestyle practices with diet and exercise For patients under 41yo, I recommend 1000mg  calcium daily and 600IU of vitamin D daily.

## 2024-05-11 NOTE — Progress Notes (Signed)
 [name] with fan hx of breast cancer (negative genetic testing. TC risk is 20.4%) here for annual exam. Married, wife. 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 Son Donna Mcdowell is 90 years old and 2 month ago daughter (wife carried).    Patient's last menstrual period was 04/21/2024 (approximate). Period Cycle (Days): 28 Period Duration (Days): 7 Period Pattern: Regular Menstrual Flow: Moderate Menstrual Control: Tampon Menstrual Control Change Freq (Hours): 4 Dysmenorrhea: (!) Moderate Dysmenorrhea Symptoms: Cramping  She reports bump that drains on right labia that has concerns about this. Previously self drained.  Since then she has not had any additional swelling but notices the area. Has 7 pounds over the past year!  Abnormal bleeding: none Pelvic discharge or pain: none Breast mass, nipple discharge or skin changes : none  Sexually active: yes Birth control: none History of abnormal Pap:  Yes, LEEP for CIN III in 5/14  Last PAP:     Component Value Date/Time   DIAGPAP  10/23/2020 1139    - Negative for intraepithelial lesion or malignancy (NILM)   DIAGPAP  09/23/2017 0000    NEGATIVE FOR INTRAEPITHELIAL LESIONS OR MALIGNANCY.   HPVHIGH Negative 10/23/2020 1139   ADEQPAP  10/23/2020 1139    Satisfactory for evaluation; transformation zone component PRESENT.   ADEQPAP  09/23/2017 0000    Satisfactory for evaluation  endocervical/transformation zone component PRESENT.   Last mammogram: 01-02-22 bilateral, mri 07-31-22, left breast biopsy 08-26-22  benign, due for f/u MRI  Exercising: 5d/wk Smoker: no, former  Garment/textile technologist Visit from 05/11/2024 in [facility]  PHQ-2 Total Score 0    Flowsheet Row Office Visit from 02/15/2023 in [facility] [place]  PHQ-9 Total Score 0     GYN HISTORY: Hx of LEEP, 2014  OB History  Gravida Para Term Preterm AB Living  0        SAB IAB Ectopic Multiple Live Births         Past Medical History:  Diagnosis Date   Carbuncle of face    forehead   Deviated septum 07/2018   Family history of breast cancer 12/26/2021   Nasal mass 07/2018   Nasal turbinate hypertrophy 07/2018   bilateral   Past Surgical History:  Procedure Laterality Date   EXCISION NASAL MASS Left 08/30/2018   Procedure: EXCISION OF NASAL MASS;  Surgeon: Karis Clunes, MD;  Location: [facility];  Service: ENT;  Laterality: Left;   EXCISION NASAL MASS Left 03/18/2021   Procedure: EXCISION LEFT NASAL INVERTED PAPILLOMAS;  Surgeon: Karis Clunes, MD;  Location: [facility];  Service: ENT;  Laterality: Left;   FINGER TENDON REPAIR Right 06/23/2010   long finger   INCISION AND DRAINAGE BREAST ABSCESS Left 06/09/2011   NASAL SEPTOPLASTY W/ TURBINOPLASTY Bilateral 08/30/2018   Procedure: NASAL SEPTOPLASTY WITH TURBINATE REDUCTION;  Surgeon: Karis Clunes, MD;  Location: [facility];  Service: ENT;  Laterality: Bilateral;   TRIGGER FINGER RELEASE Right 06/23/2010   long finger   WISDOM TOOTH EXTRACTION     Current Outpatient Medications on File Prior to Visit  Medication Sig Dispense Refill   acetaminophen  (TYLENOL ) 500 MG tablet Take 1,000 mg by mouth every 6 (six) hours as needed.     azelastine  (ASTELIN ) 0.1 % nasal spray Place 1 spray into both nostrils 2 (two) times daily. Use in each nostril as directed 30 mL  1   Cholecalciferol (D3 PO) Take by mouth.     Cyanocobalamin  (B-12 PO) Take by mouth.     fexofenadine  (ALLEGRA ) 180 MG tablet Take 180 mg by mouth daily.     fluticasone  (FLONASE ) 50 MCG/ACT nasal spray Place 2 sprays into both nostrils daily. 48 g 3   ibuprofen  (ADVIL ) 600 MG tablet Take 1 tablet (600 mg total) by mouth every 6 (six) hours as needed. 30 tablet 0   Multiple Vitamin (MULTIVITAMIN) tablet Take 1 tablet by mouth daily.     Testosterone 1.62 % GEL Apply 2 pumps (1mg ) topically every day to the  inner thigh or over the shoulders and cover with clothing. Wash hands thoroughly after application.     No current facility-administered medications on file prior to visit.   Social History   Socioeconomic History   Marital status: Married    Spouse name: Not on file   Number of children: Not on file   Years of education: Not on file   Highest education level: Not on file  Occupational History   Not on file  Tobacco Use   Smoking status: Former    Current packs/day: 1.00    Average packs/day: 1 pack/day for 19.0 years (19.0 ttl pk-yrs)    Types: Cigarettes   Smokeless tobacco: Never  Vaping Use   Vaping status: Every Day   Substances: Nicotine  Substance and Sexual Activity   Alcohol use: Yes    Comment: Drinks 2 drinks a day   Drug use: No   Sexual activity: Yes    Partners: Female    Comment: female partner  Other Topics Concern   Not on file  Social History Narrative   Not on file   Social Drivers of Health   Financial Resource Strain: Not on file  Food Insecurity: Not on file  Transportation Needs: Not on file  Physical Activity: Not on file  Stress: Not on file  Social Connections: Not on file  Intimate Partner Violence: Not on file   Family History  Problem Relation Age of Onset   Breast cancer Mother 48   Colon polyps Mother        unknown number   Osteoarthritis Maternal Grandmother    Lung cancer Maternal Grandmother        dx 30s   Osteoarthritis Maternal Grandfather    Heart failure Maternal Grandfather    Allergies  Allergen Reactions   Oxycodone Nausea And Vomiting     PE Today's Vitals   05/11/24 0811  BP: 120/76  Pulse: 73  Temp: 97.9 F (36.6 C)  TempSrc: Oral  SpO2: 99%  Weight: 133 lb 4.8 oz (60.5 kg)  Height: 5' 4 (1.626 m)   Body mass index is 22.88 kg/m.  Physical Exam Vitals reviewed. Exam conducted with a chaperone present.  Constitutional:      General: She is not in acute distress.    Appearance: Normal  appearance.  HENT:     Head: Normocephalic and atraumatic.     Nose: Nose normal.  Eyes:     Extraocular Movements: Extraocular movements intact.     Conjunctiva/sclera: Conjunctivae normal.  Neck:     Thyroid : No thyroid  mass, thyromegaly or thyroid  tenderness.  Pulmonary:     Effort: Pulmonary effort is normal.  Chest:     Chest wall: No mass or tenderness.  Breasts:    Right: Normal. No swelling, mass, nipple discharge, skin change or tenderness.     Left: Normal.  No swelling, mass, nipple discharge, skin change or tenderness.  Abdominal:     General: There is no distension.     Palpations: Abdomen is soft.     Tenderness: There is no abdominal tenderness.  Genitourinary:    General: Normal vulva.     Exam position: Lithotomy position.     Urethra: No prolapse.     Vagina: Normal. No vaginal discharge or bleeding.     Cervix: Normal. No lesion.     Uterus: Normal. Not enlarged and not tender.      Adnexa: Right adnexa normal and left adnexa normal.      Comments: 5mm wart vs inclusion cyst of outer R labia minora Musculoskeletal:        General: Normal range of motion.     Cervical back: Normal range of motion.  Lymphadenopathy:     Upper Body:     Right upper body: No axillary adenopathy.     Left upper body: No axillary adenopathy.     Lower Body: No right inguinal adenopathy. No left inguinal adenopathy.  Skin:    General: Skin is warm and dry.  Neurological:     General: No focal deficit present.     Mental Status: She is alert.  Psychiatric:        Mood and Affect: Mood normal.        Behavior: Behavior normal.       Assessment and Plan:        Well woman exam with routine gynecological exam Assessment & Plan: Cervical cancer screening performed according to ASCCP guidelines. Encouraged annual mammogram and MRI screening Labs and immunizations with her primary Encouraged safe sexual practices as indicated Encouraged healthy lifestyle practices with  diet and exercise For patients under 50yo, I recommend 1000mg  calcium daily and 600IU of vitamin D  daily.    Cervical cancer screening -     Cytology - PAP  Negative depression screening  At increased risk of breast cancer -     MR BREAST BILATERAL W WO CONTRAST INC CAD; Future  Encounter for screening mammogram for malignant neoplasm of breast -     3D Screening Mammogram, Left and Right; Future   Donna LULLA Pa, MD

## 2024-05-11 NOTE — Patient Instructions (Signed)

## 2024-05-16 LAB — CYTOLOGY - PAP
Comment: NEGATIVE
Diagnosis: NEGATIVE
Diagnosis: REACTIVE
High risk HPV: NEGATIVE

## 2024-05-17 ENCOUNTER — Ambulatory Visit: Payer: Self-pay | Admitting: Obstetrics and Gynecology

## 2024-05-25 DIAGNOSIS — M25569 Pain in unspecified knee: Secondary | ICD-10-CM | POA: Diagnosis not present

## 2024-06-01 ENCOUNTER — Ambulatory Visit
Admission: RE | Admit: 2024-06-01 | Discharge: 2024-06-01 | Disposition: A | Discharge status: Home / Self Care | Source: Ambulatory Visit | Attending: Obstetrics and Gynecology | Admitting: Obstetrics and Gynecology

## 2024-06-01 DIAGNOSIS — Z1231 Encounter for screening mammogram for malignant neoplasm of breast: Secondary | ICD-10-CM

## 2024-06-20 ENCOUNTER — Telehealth: Payer: Self-pay | Admitting: Obstetrics and Gynecology

## 2024-06-20 DIAGNOSIS — M25569 Pain in unspecified knee: Secondary | ICD-10-CM | POA: Diagnosis not present

## 2024-06-20 NOTE — Telephone Encounter (Signed)
 Approval for breast MRI received from [employer]- 06/06/24-12/03/24

## 2024-06-21 ENCOUNTER — Encounter: Payer: Self-pay | Admitting: Obstetrics and Gynecology

## 2024-06-24 ENCOUNTER — Other Ambulatory Visit: Payer: Self-pay | Admitting: Nurse Practitioner

## 2024-06-24 ENCOUNTER — Telehealth: Admitting: Nurse Practitioner

## 2024-06-24 DIAGNOSIS — J019 Acute sinusitis, unspecified: Secondary | ICD-10-CM

## 2024-06-24 DIAGNOSIS — B9689 Other specified bacterial agents as the cause of diseases classified elsewhere: Secondary | ICD-10-CM

## 2024-06-24 MED ORDER — AMOXICILLIN-POT CLAVULANATE 875-125 MG PO TABS: 1.0000 | ORAL_TABLET | Freq: Two times a day (BID) | ORAL | 0 refills | Status: DC

## 2024-06-24 MED ORDER — AMOXICILLIN-POT CLAVULANATE 875-125 MG PO TABS
1.0000 | ORAL_TABLET | Freq: Two times a day (BID) | ORAL | 0 refills | Status: AC
Start: 2024-06-24 — End: 2024-07-01

## 2024-06-24 NOTE — Progress Notes (Signed)
 Virtual Visit Consent   Donna Mcdowell, you are scheduled for a virtual visit with a [facility] provider today. Just as with appointments in the office, your consent must be obtained to participate. Your consent will be active for this visit and any virtual visit you may have with one of our providers in the next 365 days. If you have a MyChart account, a copy of this consent can be sent to you electronically.  As this is a virtual visit, video technology does not allow for your provider to perform a traditional examination. This may limit your provider's ability to fully assess your condition. If your provider identifies any concerns that need to be evaluated in person or the need to arrange testing (such as labs, EKG, etc.), we will make arrangements to do so. Although advances in technology are sophisticated, we cannot ensure that it will always work on either your end or our end. If the connection with a video visit is poor, the visit may have to be switched to a telephone visit. With either a video or telephone visit, we are not always able to ensure that we have a secure connection.  By engaging in this virtual visit, you consent to the provision of healthcare and authorize for your insurance to be billed (if applicable) for the services provided during this visit. Depending on your insurance coverage, you may receive a charge related to this service.  I need to obtain your verbal consent now. Are you willing to proceed with your visit today? Donna Mcdowell has provided verbal consent on 06/24/2024 for a virtual visit (video or telephone). Haze LELON Servant, NP  Date: 06/24/2024 5:23 PM   Virtual Visit via Video Note   I, Haze LELON Servant, connected with  Donna Mcdowell  ([id number], 1981-03-22) on 06/24/24 at  5:15 PM EDT by a video-enabled telemedicine application and verified that I am speaking with the correct person using two identifiers.  Location: Patient: Virtual Visit Location  Patient: Home Provider: Virtual Visit Location Provider: Home Office   I discussed the limitations of evaluation and management by telemedicine and the availability of in person appointments. The patient expressed understanding and agreed to proceed.    History of Present Illness: Donna Mcdowell is a 43 y.o. who identifies as a female who was assigned female at birth, and is being seen today for sinusitis.  Donna Mcdowell has been experiencing sinusitis symptoms for 2 weeks.  Initially she felt better and symptoms seem to be improving and then started to worsen.  Symptoms include sinus pressure, headache, postnasal drainage, scratchy throat, headache, coughing and sneezing.  She has been taking Tylenol  and Allegra  and using Flonase  with no improvement in symptoms.    Problems:  Patient Active Problem List   Diagnosis Date Noted   Well woman exam with routine gynecological exam 05/11/2024   At increased risk of breast cancer 05/11/2024   Chronic rhinitis 07/28/2023   Other specified disorders of eustachian tube, bilateral 07/28/2023   Allergic rhinitis 02/18/2023   Routine general medical examination at a health care facility 02/18/2023   Vitamin D  deficiency 02/18/2023   Genetic testing 01/15/2022   Family history of breast cancer 12/26/2021   Lightheadedness 10/16/2019   Former smoker 11/02/2016   CIN III (cervical intraepithelial neoplasia grade III) with severe dysplasia 05/13/2016    Allergies:  Allergies  Allergen Reactions   Oxycodone Nausea And Vomiting   Medications:  Current Outpatient Medications:    amoxicillin -clavulanate (AUGMENTIN )  875-125 MG tablet, Take 1 tablet by mouth 2 (two) times daily for 7 days., Disp: 14 tablet, Rfl: 0   acetaminophen  (TYLENOL ) 500 MG tablet, Take 1,000 mg by mouth every 6 (six) hours as needed., Disp: , Rfl:    azelastine  (ASTELIN ) 0.1 % nasal spray, Place 1 spray into both nostrils 2 (two) times daily. Use in each nostril as directed, Disp:  30 mL, Rfl: 1   Cholecalciferol (D3 PO), Take by mouth., Disp: , Rfl:    Cyanocobalamin  (B-12 PO), Take by mouth., Disp: , Rfl:    fexofenadine  (ALLEGRA ) 180 MG tablet, Take 180 mg by mouth daily., Disp: , Rfl:    fluticasone  (FLONASE ) 50 MCG/ACT nasal spray, Place 2 sprays into both nostrils daily., Disp: 48 g, Rfl: 3   ibuprofen  (ADVIL ) 600 MG tablet, Take 1 tablet (600 mg total) by mouth every 6 (six) hours as needed., Disp: 30 tablet, Rfl: 0   Multiple Vitamin (MULTIVITAMIN) tablet, Take 1 tablet by mouth daily., Disp: , Rfl:    Testosterone 1.62 % GEL, Apply 2 pumps (1mg ) topically every day to the inner thigh or over the shoulders and cover with clothing. Wash hands thoroughly after application., Disp: , Rfl:   Observations/Objective: Patient is well-developed, well-nourished in no acute distress.  Resting comfortably at home.  Head is normocephalic, atraumatic.  No labored breathing.  Speech is clear and coherent with logical content.  Patient is alert and oriented at baseline.    Assessment and Plan: 1. Acute bacterial sinusitis (Primary) - amoxicillin -clavulanate (AUGMENTIN ) 875-125 MG tablet; Take 1 tablet by mouth 2 (two) times daily for 7 days.  Dispense: 14 tablet; Refill: 0 Continue Allegra  and Flonase  daily INSTRUCTIONS: use a humidifier for nasal congestion Drink plenty of fluids, rest and wash hands frequently to avoid the spread of infection Alternate tylenol  and Motrin  for relief of fever   Follow Up Instructions: I discussed the assessment and treatment plan with the patient. The patient was provided an opportunity to ask questions and all were answered. The patient agreed with the plan and demonstrated an understanding of the instructions.  A copy of instructions were sent to the patient via MyChart unless otherwise noted below.    The patient was advised to call back or seek an in-person evaluation if the symptoms worsen or if the condition fails to improve as  anticipated.    Patterson Hollenbaugh W Susana Gripp, NP

## 2024-06-24 NOTE — Patient Instructions (Signed)
 Donna Mcdowell, thank you for joining Haze LELON Servant, NP for today's virtual visit.  While this provider is not your primary care provider (PCP), if your PCP is located in our provider database this encounter information will be shared with them immediately following your visit.   A [facility] MyChart account gives you access to today's visit and all your visits, tests, and labs performed at [facility]  click here if you don't have a [facility] MyChart account or go to [email or web address]  Consent: (Patient) Donna Mcdowell verbal consent for this virtual visit at the beginning of the encounter.  Current Medications:  Current Outpatient Medications:    amoxicillin -clavulanate (AUGMENTIN ) 875-125 MG tablet, Take 1 tablet by mouth 2 (two) times daily for 7 days., Disp: 14 tablet, Rfl: 0   acetaminophen  (TYLENOL ) 500 MG tablet, Take 1,000 mg by mouth every 6 (six) hours as needed., Disp: , Rfl:    azelastine  (ASTELIN ) 0.1 % nasal spray, Place 1 spray into both nostrils 2 (two) times daily. Use in each nostril as directed, Disp: 30 mL, Rfl: 1   Cholecalciferol (D3 PO), Take by mouth., Disp: , Rfl:    Cyanocobalamin  (B-12 PO), Take by mouth., Disp: , Rfl:    fexofenadine  (ALLEGRA ) 180 MG tablet, Take 180 mg by mouth daily., Disp: , Rfl:    fluticasone  (FLONASE ) 50 MCG/ACT nasal spray, Place 2 sprays into both nostrils daily., Disp: 48 g, Rfl: 3   ibuprofen  (ADVIL ) 600 MG tablet, Take 1 tablet (600 mg total) by mouth every 6 (six) hours as needed., Disp: 30 tablet, Rfl: 0   Multiple Vitamin (MULTIVITAMIN) tablet, Take 1 tablet by mouth daily., Disp: , Rfl:    Testosterone 1.62 % GEL, Apply 2 pumps (1mg ) topically every day to the inner thigh or over the shoulders and cover with clothing. Wash hands thoroughly after application., Disp: , Rfl:    Medications ordered in this encounter:  Meds ordered this encounter  Medications   amoxicillin -clavulanate  (AUGMENTIN ) 875-125 MG tablet    Sig: Take 1 tablet by mouth 2 (two) times daily for 7 days.    Dispense:  14 tablet    Refill:  0    Supervising Provider:   BLAISE ALEENE KIDD [[id number]]     *If you need refills on other medications prior to your next appointment, please contact your pharmacy*  Follow-Up: Call back or seek an in-person evaluation if the symptoms worsen or if the condition fails to improve as anticipated.  [facility] [phone]  Other Instructions Continue Allegra  and Flonase  daily INSTRUCTIONS: use a humidifier for nasal congestion Drink plenty of fluids, rest and wash hands frequently to avoid the spread of infection Alternate tylenol  and Motrin  for relief of fever    If you have been instructed to have an in-person evaluation today at a local Urgent Care facility, please use the link below. It will take you to a list of all of our available [facility], including address, phone number and hours of operation. Please do not delay care.  [facility]  If you or a family member do not have a primary care provider, use the link below to schedule a visit and establish care. When you choose a [facility] primary care physician or advanced practice provider, you gain a long-term partner in health. Find a Primary Care Provider  Learn more about [facility]'s in-office and virtual care options: [facility] - Get Care  Now

## 2024-06-26 DIAGNOSIS — M25569 Pain in unspecified knee: Secondary | ICD-10-CM | POA: Diagnosis not present

## 2024-06-28 ENCOUNTER — Encounter: Payer: Self-pay | Admitting: Obstetrics and Gynecology

## 2024-07-04 ENCOUNTER — Ambulatory Visit
Admission: RE | Admit: 2024-07-04 | Discharge: 2024-07-04 | Disposition: A | Discharge status: Home / Self Care | Source: Ambulatory Visit | Attending: Obstetrics and Gynecology | Admitting: Obstetrics and Gynecology

## 2024-07-04 DIAGNOSIS — Z803 Family history of malignant neoplasm of breast: Secondary | ICD-10-CM | POA: Diagnosis not present

## 2024-07-04 DIAGNOSIS — Z1239 Encounter for other screening for malignant neoplasm of breast: Secondary | ICD-10-CM | POA: Diagnosis not present

## 2024-07-04 DIAGNOSIS — Z9189 Other specified personal risk factors, not elsewhere classified: Secondary | ICD-10-CM

## 2024-07-04 MED ORDER — GADOPICLENOL 0.5 MMOL/ML IV SOLN
6.0000 mL | Freq: Once | INTRAVENOUS | Status: AC | PRN
Start: 2024-07-04 — End: 2024-07-04
  Administered 2024-07-04: 6 mL via INTRAVENOUS

## 2024-07-19 DIAGNOSIS — M25569 Pain in unspecified knee: Secondary | ICD-10-CM | POA: Diagnosis not present

## 2024-07-24 ENCOUNTER — Encounter: Payer: Self-pay | Admitting: Internal Medicine

## 2024-07-24 ENCOUNTER — Ambulatory Visit: Payer: Self-pay | Admitting: Internal Medicine

## 2024-07-24 VITALS — BP 114/70 | HR 65 | Temp 97.8°F | Ht 64.0 in | Wt 139.0 lb

## 2024-07-24 DIAGNOSIS — R42 Dizziness and giddiness: Secondary | ICD-10-CM | POA: Diagnosis not present

## 2024-07-24 DIAGNOSIS — Z9189 Other specified personal risk factors, not elsewhere classified: Secondary | ICD-10-CM | POA: Diagnosis not present

## 2024-07-24 DIAGNOSIS — Z0000 Encounter for general adult medical examination without abnormal findings: Secondary | ICD-10-CM

## 2024-07-24 DIAGNOSIS — E559 Vitamin D deficiency, unspecified: Secondary | ICD-10-CM | POA: Diagnosis not present

## 2024-07-24 DIAGNOSIS — Z23 Encounter for immunization: Secondary | ICD-10-CM | POA: Diagnosis not present

## 2024-07-24 DIAGNOSIS — Z803 Family history of malignant neoplasm of breast: Secondary | ICD-10-CM | POA: Diagnosis not present

## 2024-07-24 NOTE — Progress Notes (Signed)
" ° °  Subjective:   Patient ID: Donna Mcdowell, female    DOB: Dec 28, 1980, 43 y.o.   MRN: [id number]  The patient is here for physical. Pertinent topics discussed: Discussed the use of AI scribe software for clinical note transcription with the patient, who gave verbal consent to proceed.  History of Present Illness Donna Mcdowell is a 43 year old who presents with dizziness associated with her menstrual cycle.  She experiences significant dizziness, particularly around her menstrual cycle, feeling 'real dizzy' during the week leading up to and during her period, necessitating her to sit down due to the severity. The dizziness subsides after her period ends, though it may linger slightly. No syncope, and the dizziness is not influenced by diet or timing of meals. She also experiences vertigo during allergy season, which exacerbates her symptoms.  She has a high risk for breast cancer due to extremely dense breast tissue and a family history of breast cancer, as her mother had breast cancer at an early age. She undergoes testing related to this risk and is monitored with mammograms and MRIs every six months.  She maintains an active lifestyle, exercising consistently five days a week, Monday through Friday. She lifts her five-month-old child, contributing to her physical activity. She has two children, aged five months and three years.   PMH, [facility], social history reviewed and updated  Review of Systems  Constitutional: Negative.   HENT: Negative.    Eyes: Negative.   Respiratory:  Negative for cough, chest tightness and shortness of breath.   Cardiovascular:  Negative for chest pain, palpitations and leg swelling.  Gastrointestinal:  Negative for abdominal distention, abdominal pain, constipation, diarrhea, nausea and vomiting.  Musculoskeletal: Negative.   Skin: Negative.   Neurological:  Positive for light-headedness.  Psychiatric/Behavioral: Negative.      Objective:   Physical Exam Constitutional:      Appearance: She is well-developed.  HENT:     Head: Normocephalic and atraumatic.  Cardiovascular:     Rate and Rhythm: Normal rate and regular rhythm.  Pulmonary:     Effort: Pulmonary effort is normal. No respiratory distress.     Breath sounds: Normal breath sounds. No wheezing or rales.  Abdominal:     General: Bowel sounds are normal. There is no distension.     Palpations: Abdomen is soft.     Tenderness: There is no abdominal tenderness.  Musculoskeletal:     Cervical back: Normal range of motion.  Skin:    General: Skin is warm and dry.  Neurological:     Mental Status: She is alert and oriented to person, place, and time.     Coordination: Coordination normal.     Vitals:   07/24/24 1425  BP: 114/70  Pulse: 65  Temp: 97.8 F (36.6 C)  TempSrc: Temporal  SpO2: 97%  Weight: 139 lb (63 kg)  Height: 5' 4 (1.626 m)    Assessment & Plan:  Flu shot given at visit "

## 2024-07-24 NOTE — Patient Instructions (Signed)
 We have ordered the labs so you can come anytime to get those.

## 2024-07-26 NOTE — Assessment & Plan Note (Signed)
 Needs referral to surgeon she is high risk and wants to consider ppx mastectomy.

## 2024-07-26 NOTE — Assessment & Plan Note (Signed)
 Checking CBC, ferritin, B12, CMP during cycle to assess.

## 2024-07-26 NOTE — Assessment & Plan Note (Signed)
Flu shot given. Tetanus up to date. Mammogram up to date, pap smear up to date. Counseled about sun safety and mole surveillance. Counseled about the dangers of distracted driving. Given 10 year screening recommendations.  

## 2024-07-26 NOTE — Assessment & Plan Note (Signed)
 Checking vitamin d  level today.

## 2024-07-31 DIAGNOSIS — M25569 Pain in unspecified knee: Secondary | ICD-10-CM | POA: Diagnosis not present

## 2024-08-15 ENCOUNTER — Other Ambulatory Visit (INDEPENDENT_AMBULATORY_CARE_PROVIDER_SITE_OTHER)

## 2024-08-15 DIAGNOSIS — R42 Dizziness and giddiness: Secondary | ICD-10-CM

## 2024-08-15 DIAGNOSIS — Z0000 Encounter for general adult medical examination without abnormal findings: Secondary | ICD-10-CM | POA: Diagnosis not present

## 2024-08-15 DIAGNOSIS — E559 Vitamin D deficiency, unspecified: Secondary | ICD-10-CM

## 2024-08-15 LAB — LIPID PANEL
Cholesterol: 131 mg/dL (ref 0–200)
HDL: 43.4 mg/dL (ref >39.00)
LDL Cholesterol: 71 mg/dL (ref 0–99)
NonHDL: 87.47
Total CHOL/HDL Ratio: 3
Triglycerides: 81 mg/dL (ref 0.0–149.0)
VLDL: 16.2 mg/dL (ref 0.0–40.0)

## 2024-08-15 LAB — COMPREHENSIVE METABOLIC PANEL WITH GFR
ALT: 11 U/L (ref 0–35)
AST: 12 U/L (ref 0–37)
Albumin: 4.1 g/dL (ref 3.5–5.2)
Alkaline Phosphatase: 39 U/L (ref 39–117)
BUN: 10 mg/dL (ref 6–23)
CO2: 29 meq/L (ref 19–32)
Calcium: 8.8 mg/dL (ref 8.4–10.5)
Chloride: 105 meq/L (ref 96–112)
Creatinine, Ser: 0.74 mg/dL (ref 0.40–1.20)
GFR: 100.33 mL/min (ref >60.00)
Glucose, Bld: 75 mg/dL (ref 70–99)
Potassium: 4 meq/L (ref 3.5–5.1)
Sodium: 139 meq/L (ref 135–145)
Total Bilirubin: 0.6 mg/dL (ref 0.2–1.2)
Total Protein: 6.3 g/dL (ref 6.0–8.3)

## 2024-08-15 LAB — CBC
HCT: 37.2 % (ref 36.0–46.0)
Hemoglobin: 12.7 g/dL (ref 12.0–15.0)
MCHC: 34.2 g/dL (ref 30.0–36.0)
MCV: 93.3 fl (ref 78.0–100.0)
Platelets: 186 K/uL (ref 150.0–400.0)
RBC: 3.99 Mil/uL (ref 3.87–5.11)
RDW: 13.5 % (ref 11.5–15.5)
WBC: 4.6 K/uL (ref 4.0–10.5)

## 2024-08-16 LAB — VITAMIN D 25 HYDROXY (VIT D DEFICIENCY, FRACTURES): VITD: 30.61 ng/mL (ref 30.00–100.00)

## 2024-08-16 LAB — VITAMIN B12: Vitamin B-12: 510 pg/mL (ref 211–911)

## 2024-08-16 LAB — FERRITIN: Ferritin: 27.2 ng/mL (ref 10.0–291.0)

## 2024-08-21 ENCOUNTER — Ambulatory Visit: Payer: Self-pay | Admitting: Internal Medicine

## 2024-08-25 DIAGNOSIS — Z803 Family history of malignant neoplasm of breast: Secondary | ICD-10-CM | POA: Diagnosis not present

## 2024-08-25 DIAGNOSIS — Z9189 Other specified personal risk factors, not elsewhere classified: Secondary | ICD-10-CM | POA: Diagnosis not present

## 2024-08-25 DIAGNOSIS — N6452 Nipple discharge: Secondary | ICD-10-CM | POA: Diagnosis not present

## 2024-08-29 DIAGNOSIS — H5212 Myopia, left eye: Secondary | ICD-10-CM | POA: Diagnosis not present

## 2024-09-11 DIAGNOSIS — R92343 Mammographic extreme density, bilateral breasts: Secondary | ICD-10-CM | POA: Diagnosis not present

## 2024-09-14 ENCOUNTER — Encounter: Payer: Self-pay | Admitting: Emergency Medicine

## 2024-09-14 ENCOUNTER — Ambulatory Visit (INDEPENDENT_AMBULATORY_CARE_PROVIDER_SITE_OTHER): Admitting: Emergency Medicine

## 2024-09-14 VITALS — BP 112/80 | HR 71 | Temp 98.3°F | Ht 64.0 in | Wt 135.0 lb

## 2024-09-14 DIAGNOSIS — G500 Trigeminal neuralgia: Secondary | ICD-10-CM | POA: Insufficient documentation

## 2024-09-14 MED ORDER — PREDNISONE 20 MG PO TABS: 40.0000 mg | ORAL_TABLET | Freq: Every day | ORAL | 0 refills | Status: AC

## 2024-09-14 MED ORDER — VALACYCLOVIR HCL 1 G PO TABS: 1000.0000 mg | ORAL_TABLET | Freq: Two times a day (BID) | ORAL | 0 refills | Status: AC

## 2024-09-14 NOTE — Assessment & Plan Note (Signed)
 Clinically stable.  No red flag signs or symptoms No significant clinical findings.  No rash. No findings of Bell's palsy. No findings of shingles.  However could be early herpes zoster infection Seems to be having neuropathic pain.  Pain before the rash. Recommend prednisone  40 mg daily for 5 days along with valacyclovir  1000 mg twice a day for 5 days Advised to contact the office if no better or worse during the next several days Recommend follow-up with PCP
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
## 2024-09-14 NOTE — Patient Instructions (Signed)
 Trigeminal Neuralgia  Trigeminal neuralgia is a nerve disorder that causes severe pain on one side of the face. The pain may last from a few seconds to several minutes, but it can happen hundreds of times a day. The pain is usually only on one side of the face. Symptoms may occur for days, weeks, or months and then go away for months or years. The pain may return and be worse than before. What are the causes? This condition may be caused by: Damage or pressure to a nerve in the head that is called the trigeminal nerve. An attack can be triggered by: Talking or chewing. Putting on makeup. Washing, shaving, or touching your face. Brushing your teeth. Blasts of hot or cold air. Primary demyelinating disorders, such as multiple sclerosis. Tumors. What increases the risk? You are more likely to develop this condition if: You are 18-63 years old. You are female. What are the signs or symptoms? The main symptom of this condition is severe pain in the jaw, lips, eyes, nose, scalp, forehead, and face. How is this diagnosed? This condition is diagnosed with a physical exam. A CT scan or an MRI may be done to rule out other conditions that can cause facial pain. How is this treated? This condition may be treated with: Measures to avoid the things that trigger your symptoms. Prescription medicines such as anticonvulsants. Procedures such as ablation, thermal, or radiation therapy. Cognitive or behavioral therapy. Complementary therapies such as: Gentle, regular exercise or yoga. Meditation. Aromatherapy. Acupuncture. Surgery. This may be done in severe cases if other medical treatment does not provide relief. It may take up to one month for treatment to start relieving the pain. Follow these instructions at home: Managing pain  Learn as much as you can about how to manage your pain. Ask your health care provider if a pain specialist would be helpful. Consider talking with a mental health  care provider about how to cope with the pain. Consider joining a pain support group. General instructions Take over-the-counter and prescription medicines only as told by your health care provider. Avoid the things that trigger your symptoms. It may help to: Chew on the unaffected side of your mouth. Avoid touching your face. Avoid blasts of hot or cold air. Keep all follow-up visits. Where to find more information Facial Pain Association: facepain.org Contact a health care provider if: Your medicine is not helping your symptoms. You have side effects from the medicine used for treatment. You develop new, unexplained symptoms, such as: Double vision. Facial weakness or numbness. Changes in hearing or balance. You feel depressed. Get help right away if: Your pain is severe and is not getting better. You develop suicidal thoughts. If you ever feel like you may hurt yourself or others, or have thoughts about taking your own life, get help right away. Go to your nearest emergency department or: Call your local emergency services (911 in the [place] Call a suicide crisis helpline, such as the National Suicide Prevention Lifeline at 517-026-2744 or 988 in the [place] This is open 24 hours a day in the [place] If you're a Veteran: Call 988 and press 1. This is open 24 hours a day. Text the [employer] at 308-596-6075. Summary Trigeminal neuralgia is a nerve disorder that causes severe pain on one side of the face. The pain may last from a few seconds to several minutes. This condition is caused by damage or pressure to a nerve in the head that is called  the trigeminal nerve. Treatment may include avoiding the things that trigger your symptoms, taking medicines, or having procedures or surgery. It may take up to one month for treatment to start relieving the pain. Keep all follow-up visits. This information is not intended to replace advice given to you by your health care provider. Make sure  you discuss any questions you have with your health care provider. Document Revised: 05/13/2023 Document Reviewed: 03/24/2021 Elsevier Patient Education  2025 [employer].

## 2024-09-15 ENCOUNTER — Emergency Department (HOSPITAL_BASED_OUTPATIENT_CLINIC_OR_DEPARTMENT_OTHER)

## 2024-09-15 ENCOUNTER — Emergency Department (HOSPITAL_BASED_OUTPATIENT_CLINIC_OR_DEPARTMENT_OTHER)
Admission: EM | Admit: 2024-09-15 | Discharge: 2024-09-16 | Disposition: A | Attending: Emergency Medicine | Admitting: Emergency Medicine

## 2024-09-15 ENCOUNTER — Encounter (HOSPITAL_BASED_OUTPATIENT_CLINIC_OR_DEPARTMENT_OTHER): Payer: Self-pay | Admitting: Emergency Medicine

## 2024-09-15 DIAGNOSIS — R42 Dizziness and giddiness: Secondary | ICD-10-CM | POA: Insufficient documentation

## 2024-09-15 DIAGNOSIS — R519 Headache, unspecified: Secondary | ICD-10-CM | POA: Insufficient documentation

## 2024-09-15 DIAGNOSIS — H538 Other visual disturbances: Secondary | ICD-10-CM | POA: Insufficient documentation

## 2024-09-15 DIAGNOSIS — M542 Cervicalgia: Secondary | ICD-10-CM | POA: Insufficient documentation

## 2024-09-15 LAB — CBC
HCT: 37.9 % (ref 36.0–46.0)
Hemoglobin: 13 g/dL (ref 12.0–15.0)
MCH: 31.2 pg (ref 26.0–34.0)
MCHC: 34.3 g/dL (ref 30.0–36.0)
MCV: 90.9 fL (ref 80.0–100.0)
Platelets: 229 K/uL (ref 150–400)
RBC: 4.17 MIL/uL (ref 3.87–5.11)
RDW: 12.9 % (ref 11.5–15.5)
WBC: 7.8 K/uL (ref 4.0–10.5)
nRBC: 0 % (ref 0.0–0.2)

## 2024-09-15 LAB — URINALYSIS, ROUTINE W REFLEX MICROSCOPIC
Bilirubin Urine: NEGATIVE
Glucose, UA: NEGATIVE mg/dL
Hgb urine dipstick: NEGATIVE
Ketones, ur: NEGATIVE mg/dL
Leukocytes,Ua: NEGATIVE
Nitrite: NEGATIVE
Protein, ur: NEGATIVE mg/dL
Specific Gravity, Urine: 1.005 (ref 1.005–1.030)
pH: 6.5 (ref 5.0–8.0)

## 2024-09-15 LAB — CBG MONITORING, ED: Glucose-Capillary: 99 mg/dL (ref 70–99)

## 2024-09-15 LAB — COMPREHENSIVE METABOLIC PANEL WITH GFR
ALT: 16 U/L (ref 0–44)
AST: 19 U/L (ref 15–41)
Albumin: 4.8 g/dL (ref 3.5–5.0)
Alkaline Phosphatase: 64 U/L (ref 38–126)
Anion gap: 12 (ref 5–15)
BUN: 12 mg/dL (ref 6–20)
CO2: 25 mmol/L (ref 22–32)
Calcium: 9.2 mg/dL (ref 8.9–10.3)
Chloride: 103 mmol/L (ref 98–111)
Creatinine, Ser: 0.64 mg/dL (ref 0.44–1.00)
GFR, Estimated: >60 mL/min (ref >60)
Glucose, Bld: 107 mg/dL — ABNORMAL HIGH (ref 70–99)
Potassium: 4 mmol/L (ref 3.5–5.1)
Sodium: 139 mmol/L (ref 135–145)
Total Bilirubin: 0.3 mg/dL (ref 0.0–1.2)
Total Protein: 6.9 g/dL (ref 6.5–8.1)

## 2024-09-15 LAB — PREGNANCY, URINE: Preg Test, Ur: NEGATIVE

## 2024-09-15 NOTE — ED Triage Notes (Signed)
 Sudden dizziness/ headache, room spinning Started around 9 pm Chronic dizziness but this feels different Has been having right ear fullness Headache, light sensitive  Prescribed prednisone  and antiviral today for potential shingles

## 2024-09-16 NOTE — ED Provider Notes (Signed)
 Of note, patient did have a mild drop in her blood pressure but is overall asymptomatic and ambulatory   Midge Golas, MD 09/16/24 [phone]

## 2024-09-16 NOTE — Discharge Instructions (Signed)
Your exam shows you have had an episode of vertigo, which causes a false sense of movement such as a spinning feeling or walls that seem to move.  Most vertigo is caused by a (usually temporary) problem in the inner ear. Rarely, the back part of the brain can cause vertigo (some mini-strokes/strokes), but it appears to be a low risk cause for you at this time. It is important to follow-up with your doctor however, to see if you need further testing.   Do not drive or participate in potentially dangerous activities requiring balance unless off meds (not drowsy) and the vertigo has resolved. Most of the time benign vertigo is much better after a few days. However, mild unsteadiness may last for up to 3 months in some patients. An MRI scan or other special tests to evaluate your hearing and balance may be needed if the vertigo does not improve or returns in the future.   RETURN IMMEDIATELY IF YOU HAVE ANY OF THE FOLLOWING (call 911): Increasing vertigo, earache, ear drainage, or loss of hearing.  Severe headache, blurred or double vision, or trouble walking.  Fainting or poorly responsive, extreme weakness, chest pain, or palpitations.  Fever, persistent vomiting, or dehydration.  Numbness, tingling, incoordination, or weakness of the limbs.  Change in speech, vision, swallowing, understanding, or other concerns.  
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
## 2024-09-21 ENCOUNTER — Ambulatory Visit

## 2024-09-26 ENCOUNTER — Ambulatory Visit: Payer: Self-pay

## 2024-09-26 NOTE — Telephone Encounter (Signed)
 FYI Only or Action Required?: FYI only for provider: appointment scheduled on 09/29/24.  Patient was last seen in primary care on 09/14/2024 by Purcell Emil Schanz, MD.  Called Nurse Triage reporting Anxiety.  Symptoms began ongoing intermittently.  Interventions attempted: Nothing.  Symptoms are: gradually worsening.  Triage Disposition: See PCP When Office is Open (Within 3 Days)  Patient/caregiver understands and will follow disposition?: Yes                                  1. CONCERN: Did anything happen that prompted you to call today?      Increased anxiety  2. ANXIETY SYMPTOMS: Can you describe how you (your loved one; patient) have been feeling? (e.g., tense, restless, panicky, anxious, keyed up, overwhelmed, sense of impending doom).      I just feel anxious 3. ONSET: How long have you been feeling this way? (e.g., hours, days, weeks)     Comes and goes, states she had a panic attack about a week ago and this was the first one in awhile 4. SEVERITY: How would you rate the level of anxiety? (e.g., 0 - 10; or mild, moderate, severe).     Rates anxiety 5-6 at this time 5. FUNCTIONAL IMPAIRMENT: How have these feelings affected your ability to do daily activities? Have you had more difficulty than usual doing your normal daily activities? (e.g., getting better, same, worse; self-care, school, work, interactions)     Yes, states socialization and working out/going to the the gym have been affected  6. HISTORY: Have you felt this way before? Have you ever been diagnosed with an anxiety problem in the past? (e.g., generalized anxiety disorder, panic attacks, PTSD). If Yes, ask: How was this problem treated? (e.g., medicines, counseling, etc.)     States she has dealth with panic attacks in the past 7. RISK OF HARM - SUICIDAL IDEATION: Do you ever have thoughts of hurting or killing yourself? If Yes, ask:  Do you have these  feelings now? Do you have a plan on how you would do this?     Denies, states she is safe at this time 8. TREATMENT:  What has been done so far to treat this anxiety? (e.g., medicines, relaxation strategies). What has helped?     Denies medications  9. THERAPIST: Do you have a counselor or therapist? If Yes, ask: What is their name?     Denies 10. POTENTIAL TRIGGERS: Do you drink caffeinated beverages (e.g., coffee, colas, teas), and how much daily? Do you drink alcohol or use any drugs? Have you started any new medicines recently?     Unsure, states she hasn't felt well due to health issues and weather changes  12. OTHER SYMPTOMS: Do you have any other symptoms? (e.g., feeling depressed, trouble concentrating, trouble sleeping, trouble breathing, palpitations or fast heartbeat, chest pain, sweating, nausea, or diarrhea)      Denies difficulty breathing, denies chest pain    This RN advised in-person evaluation within 3 days. Patient has been scheduled with PCP for Friday of this week. Patient was recently evaluated in ED for vertigo symptoms, but stated this was unrelated to acute symptom of worsening anxiety.   Copied from CRM #[id number]. Topic: Clinical - Red Word Triage >> Sep 26, 2024 11:43 AM Mia F wrote: Red Word that prompted transfer to Nurse Triage: Increased anxiety. Has been going on for 2-3 weeks. She says she cannot  say it is getting worse but is affecting her day to day activities.  Reason for Disposition  MODERATE anxiety (e.g., persistent or frequent anxiety symptoms; interferes with sleep, school, or work)  Protocols used: Anxiety and Panic Attack-A-AH

## 2024-09-26 NOTE — Telephone Encounter (Signed)
 Appt noted

## 2024-09-29 ENCOUNTER — Encounter: Payer: Self-pay | Admitting: Internal Medicine

## 2024-09-29 ENCOUNTER — Ambulatory Visit (INDEPENDENT_AMBULATORY_CARE_PROVIDER_SITE_OTHER): Admitting: Internal Medicine

## 2024-09-29 VITALS — BP 100/60 | HR 73 | Temp 98.2°F | Ht 64.0 in | Wt 137.8 lb

## 2024-09-29 DIAGNOSIS — F411 Generalized anxiety disorder: Secondary | ICD-10-CM

## 2024-09-29 DIAGNOSIS — R5383 Other fatigue: Secondary | ICD-10-CM

## 2024-09-29 MED ORDER — ESCITALOPRAM OXALATE 10 MG PO TABS: 10.0000 mg | ORAL_TABLET | Freq: Every day | ORAL | 3 refills | Status: DC

## 2024-09-29 MED ORDER — BUSPIRONE HCL 5 MG PO TABS: 5.0000 mg | ORAL_TABLET | Freq: Two times a day (BID) | ORAL | 1 refills | Status: AC | PRN

## 2024-09-29 NOTE — Assessment & Plan Note (Signed)
 Anxiety has increased with a recent panic attack following vertigo. Hormonal influences and medication options were discussed. Lexapro is considered for perimenopausal anxiety, but she prefers as-needed medication initially. Prescribed Buspar for as-needed use. Plan to start Lexapro post-holidays with a dose titration plan 5 mg daily for 1 week then 10 mg daily. Follow-up in 3-4 weeks after starting to assess medication efficacy.

## 2024-09-29 NOTE — Progress Notes (Signed)
 "  Subjective:   Patient ID: Donna Mcdowell, female    DOB: August 20, 1981, 43 y.o.   MRN: [id number]  Discussed the use of AI scribe software for clinical note transcription with the patient, who gave verbal consent to proceed.  History of Present Illness Donna Mcdowell is a 43 year old who presents with increased anxiety and panic attacks.  She has been experiencing increased anxiety and panic attacks, with a recent severe panic attack following a vertigo episode. The panic attack was characterized by uncontrollable shaking and was the most intense she has experienced. Since then, she has been anxious, worried about having another attack, and experiencing constant pressure in her head.  Her anxiety has been present for years but has worsened recently. She notes that her vertigo seems to worsen around her menstrual period, which she attributes to hormonal changes, although the recent episode occurred unexpectedly in her cycle.  She feels fatigued and lacks motivation, which she associates with her anxiety. She has not been as active in the past three to four weeks due to her anxiety. Typically, she works out regularly and eats well, but there have been no significant changes in her routine.  Regarding sleep, she describes herself as an 'up late kind of person' and uses late hours to decompress. She does not consistently get eight hours of sleep but feels this is her baseline.  She has been hesitant to start progesterone and testosterone prescribed through home health due to concerns about hormonal effects.  Review of Systems  Constitutional:  Positive for fatigue.  HENT: Negative.    Eyes: Negative.   Respiratory:  Negative for cough, chest tightness and shortness of breath.   Cardiovascular:  Negative for chest pain, palpitations and leg swelling.  Gastrointestinal:  Negative for abdominal distention, abdominal pain, constipation, diarrhea, nausea and vomiting.  Musculoskeletal:  Negative.   Skin: Negative.   Neurological:  Positive for dizziness and light-headedness.  Psychiatric/Behavioral:  The patient is nervous/anxious.     Objective:  Physical Exam Constitutional:      Appearance: She is well-developed.  HENT:     Head: Normocephalic and atraumatic.  Cardiovascular:     Rate and Rhythm: Normal rate and regular rhythm.  Pulmonary:     Effort: Pulmonary effort is normal. No respiratory distress.     Breath sounds: Normal breath sounds. No wheezing or rales.  Abdominal:     General: Bowel sounds are normal. There is no distension.     Palpations: Abdomen is soft.     Tenderness: There is no abdominal tenderness.  Musculoskeletal:     Cervical back: Normal range of motion.  Skin:    General: Skin is warm and dry.  Neurological:     Mental Status: She is alert and oriented to person, place, and time.     Coordination: Coordination normal.     Vitals:   09/29/24 1112  BP: 100/60  Pulse: 73  Temp: 98.2 F (36.8 C)  TempSrc: Oral  SpO2: 97%  Weight: 137 lb 12.8 oz (62.5 kg)  Height: 5' 4 (1.626 m)    Assessment and Plan Assessment & Plan Generalized anxiety disorder with panic attacks   Anxiety has increased with a recent panic attack following vertigo. Hormonal influences and medication options were discussed. Lexapro is considered for perimenopausal anxiety, but she prefers as-needed medication initially. Prescribed Buspar for as-needed use. Plan to start Lexapro post-holidays with a dose titration plan 5 mg daily for 1 week then  10 mg daily. Follow-up in 3-4 weeks after starting to assess medication efficacy.  Other fatigue Has rx for progesterone and testosterone gel and has not started yet. Would like to follow up with us  afterwards advised timeline of 6-8 weeks after starting. Ordered AM cortisol level.   Perimenopausal symptoms   Symptoms, including anxiety and vertigo, may be related to perimenopausal hormonal changes. Discussed  starting progesterone and testosterone with a monitoring plan. She is hesitant to start immediately. Start progesterone and testosterone as prescribed by home health. Monitor symptoms and effects over 2-3 months. Follow up with primary care for medication management.   "

## 2024-09-29 NOTE — Patient Instructions (Addendum)
 We will send in the buspar to take as needed for anxiety up to twice a day.  We also sent in lexapro (escitalopram) to take 1/2 pill daily for 1 week then increase to 1 pill daily when you are ready.  Let us  know about 3-4 weeks after starting how you think this is working and we can change or adjust the dose as needed.

## 2024-09-29 NOTE — Assessment & Plan Note (Signed)
 Has rx for progesterone and testosterone gel and has not started yet. Would like to follow up with us  afterwards advised timeline of 6-8 weeks after starting. Ordered AM cortisol level.

## 2024-10-03 ENCOUNTER — Telehealth: Admitting: Physician Assistant

## 2024-10-03 DIAGNOSIS — H6011 Cellulitis of right external ear: Secondary | ICD-10-CM

## 2024-10-03 DIAGNOSIS — B9689 Other specified bacterial agents as the cause of diseases classified elsewhere: Secondary | ICD-10-CM

## 2024-10-03 DIAGNOSIS — J019 Acute sinusitis, unspecified: Secondary | ICD-10-CM | POA: Diagnosis not present

## 2024-10-03 MED ORDER — PREDNISONE 20 MG PO TABS: 40.0000 mg | ORAL_TABLET | Freq: Every day | ORAL | 0 refills | Status: DC

## 2024-10-03 MED ORDER — AMOXICILLIN-POT CLAVULANATE 875-125 MG PO TABS: 1.0000 | ORAL_TABLET | Freq: Two times a day (BID) | ORAL | 0 refills | Status: DC

## 2024-10-03 NOTE — Patient Instructions (Signed)
 " Evalene DELENA Riding, thank you for joining [name], PA-C for today's virtual visit.  While this provider is not your primary care provider (PCP), if your PCP is located in our provider database this encounter information will be shared with them immediately following your visit.   A [facility] MyChart account gives you access to today's visit and all your visits, tests, and labs performed at [facility]  click here if you don't have a [facility] MyChart account or go to [email or web address]  Consent: (Patient) Evalene DELENA Riding provided verbal consent for this virtual visit at the beginning of the encounter.  Current Medications:  Current Outpatient Medications:    amoxicillin -clavulanate (AUGMENTIN ) 875-125 MG tablet, Take 1 tablet by mouth 2 (two) times daily., Disp: 20 tablet, Rfl: 0   predniSONE  (DELTASONE ) 20 MG tablet, Take 2 tablets (40 mg total) by mouth daily with breakfast., Disp: 14 tablet, Rfl: 0   acetaminophen  (TYLENOL ) 500 MG tablet, Take 1,000 mg by mouth every 6 (six) hours as needed., Disp: , Rfl:    azelastine  (ASTELIN ) 0.1 % nasal spray, Place 1 spray into both nostrils 2 (two) times daily. Use in each nostril as directed, Disp: 30 mL, Rfl: 1   busPIRone  (BUSPAR ) 5 MG tablet, Take 1 tablet (5 mg total) by mouth 2 (two) times daily as needed., Disp: 30 tablet, Rfl: 1   Cholecalciferol (D3 PO), Take by mouth., Disp: , Rfl:    Cyanocobalamin  (B-12 PO), Take by mouth., Disp: , Rfl:    escitalopram  (LEXAPRO ) 10 MG tablet, Take 1 tablet (10 mg total) by mouth daily., Disp: 30 tablet, Rfl: 3   fexofenadine  (ALLEGRA ) 180 MG tablet, Take 180 mg by mouth daily., Disp: , Rfl:    fluticasone  (FLONASE ) 50 MCG/ACT nasal spray, Place 2 sprays into both nostrils daily., Disp: 48 g, Rfl: 3   ibuprofen  (ADVIL ) 600 MG tablet, Take 1 tablet (600 mg total) by mouth every 6 (six) hours as needed., Disp: 30 tablet, Rfl: 0   Multiple Vitamin (MULTIVITAMIN) tablet,  Take 1 tablet by mouth daily., Disp: , Rfl:    Testosterone 1.62 % GEL, Apply 2 pumps (1mg ) topically every day to the inner thigh or over the shoulders and cover with clothing. Wash hands thoroughly after application. (Patient not taking: Reported on 09/29/2024), Disp: , Rfl:    Medications ordered in this encounter:  Meds ordered this encounter  Medications   amoxicillin -clavulanate (AUGMENTIN ) 875-125 MG tablet    Sig: Take 1 tablet by mouth 2 (two) times daily.    Dispense:  20 tablet    Refill:  0    Supervising Provider:   LAMPTEY, PHILIP O [[id number]]   predniSONE  (DELTASONE ) 20 MG tablet    Sig: Take 2 tablets (40 mg total) by mouth daily with breakfast.    Dispense:  14 tablet    Refill:  0    Supervising Provider:   BLAISE ALEENE KIDD [phone]     *If you need refills on other medications prior to your next appointment, please contact your pharmacy*  Follow-Up: Call back or seek an in-person evaluation if the symptoms worsen or if the condition fails to improve as anticipated.  [facility] [phone]  Other Instructions  Eustachian Tube Dysfunction  Eustachian tube dysfunction refers to a condition in which a blockage develops in the narrow passage that connects the middle ear to the back of the nose (eustachian tube). The eustachian tube regulates air pressure in the middle  ear by letting air move between the ear and nose. It also helps to drain fluid from the middle ear space. Eustachian tube dysfunction can affect one or both ears. When the eustachian tube does not function properly, air pressure, fluid, or both can build up in the middle ear. What are the causes? This condition occurs when the eustachian tube becomes blocked or cannot open normally. Common causes of this condition include: Ear infections. Colds and other infections that affect the nose, mouth, and throat (upper respiratory tract). Allergies. Irritation from cigarette smoke. Irritation  from stomach acid coming up into the esophagus (gastroesophageal reflux). The esophagus is the part of the body that moves food from the mouth to the stomach. Sudden changes in air pressure, such as from descending in an airplane or scuba diving. Abnormal growths in the nose or throat, such as: Growths that line the nose (nasal polyps). Abnormal growth of cells (tumors). Enlarged tissue at the back of the throat (adenoids). What increases the risk? You are more likely to develop this condition if: You smoke. You are overweight. You are a child who has: Certain birth defects of the mouth, such as cleft palate. Large tonsils or adenoids. What are the signs or symptoms? Common symptoms of this condition include: A feeling of fullness in the ear. Ear pain. Clicking or popping noises in the ear. Ringing in the ear (tinnitus). Hearing loss. Loss of balance. Dizziness. Symptoms may get worse when the air pressure around you changes, such as when you travel to an area of high elevation, fly on an airplane, or go scuba diving. How is this diagnosed? This condition may be diagnosed based on: Your symptoms. A physical exam of your ears, nose, and throat. Tests, such as those that measure: The movement of your eardrum. Your hearing (audiometry). How is this treated? Treatment depends on the cause and severity of your condition. In mild cases, you may relieve your symptoms by moving air into your ears. This is called popping the ears. In more severe cases, or if you have symptoms of fluid in your ears, treatment may include: Medicines to relieve congestion (decongestants). Medicines that treat allergies (antihistamines). Nasal sprays or ear drops that contain medicines that reduce swelling (steroids). A procedure to drain the fluid in your eardrum. In this procedure, a small tube may be placed in the eardrum to: Drain the fluid. Restore the air in the middle ear space. A procedure to  insert a balloon device through the nose to inflate the opening of the eustachian tube (balloon dilation). Follow these instructions at home: Lifestyle Do not do any of the following until your health care provider approves: Travel to high altitudes. Fly in airplanes. Work in a estate agent or room. Scuba dive. Do not use any products that contain nicotine or tobacco. These products include cigarettes, chewing tobacco, and vaping devices, such as e-cigarettes. If you need help quitting, ask your health care provider. Keep your ears dry. Wear fitted earplugs during showering and bathing. Dry your ears completely after. General instructions Take over-the-counter and prescription medicines only as told by your health care provider. Use techniques to help pop your ears as recommended by your health care provider. These may include: Chewing gum. Yawning. Frequent, forceful swallowing. Closing your mouth, holding your nose closed, and gently blowing as if you are trying to blow air out of your nose. Keep all follow-up visits. This is important. Contact a health care provider if: Your symptoms do not go away  after treatment. Your symptoms come back after treatment. You are unable to pop your ears. You have: A fever. Pain in your ear. Pain in your head or neck. Fluid draining from your ear. Your hearing suddenly changes. You become very dizzy. You lose your balance. Get help right away if: You have a sudden, severe increase in any of your symptoms. Summary Eustachian tube dysfunction refers to a condition in which a blockage develops in the eustachian tube. It can be caused by ear infections, allergies, inhaled irritants, or abnormal growths in the nose or throat. Symptoms may include ear pain or fullness, hearing loss, or ringing in the ears. Mild cases are treated with techniques to unblock the ears, such as yawning or chewing gum. More severe cases are treated with medicines or  procedures. This information is not intended to replace advice given to you by your health care provider. Make sure you discuss any questions you have with your health care provider. Document Revised: 12/09/2020 Document Reviewed: 12/09/2020 Elsevier Patient Education  2024 Elsevier Inc.  Sinus Infection, Adult A sinus infection, also called sinusitis, is inflammation of your sinuses. Sinuses are hollow spaces in the bones around your face. Your sinuses are located: Around your eyes. In the middle of your forehead. Behind your nose. In your cheekbones. Mucus normally drains out of your sinuses. When your nasal tissues become inflamed or swollen, mucus can become trapped or blocked. This allows bacteria, viruses, and fungi to grow, which leads to infection. Most infections of the sinuses are caused by a virus. A sinus infection can develop quickly. It can last for up to 4 weeks (acute) or for more than 12 weeks (chronic). A sinus infection often develops after a cold. What are the causes? This condition is caused by anything that creates swelling in the sinuses or stops mucus from draining. This includes: Allergies. Asthma. Infection from bacteria or viruses. Deformities or blockages in your nose or sinuses. Abnormal growths in the nose (nasal polyps). Pollutants, such as chemicals or irritants in the air. Infection from fungi. This is rare. What increases the risk? You are more likely to develop this condition if you: Have a weak body defense system (immune system). Do a lot of swimming or diving. Overuse nasal sprays. Smoke. What are the signs or symptoms? The main symptoms of this condition are pain and a feeling of pressure around the affected sinuses. Other symptoms include: Stuffy nose or congestion that makes it difficult to breathe through your nose. Thick yellow or greenish drainage from your nose. Tenderness, swelling, and warmth over the affected sinuses. A cough that may  get worse at night. Decreased sense of smell and taste. Extra mucus that collects in the throat or the back of the nose (postnasal drip) causing a sore throat or bad breath. Tiredness (fatigue). Fever. How is this diagnosed? This condition is diagnosed based on: Your symptoms. Your medical history. A physical exam. Tests to find out if your condition is acute or chronic. This may include: Checking your nose for nasal polyps. Viewing your sinuses using a device that has a light (endoscope). Testing for allergies or bacteria. Imaging tests, such as an MRI or CT scan. In rare cases, a bone biopsy may be done to rule out more serious types of fungal sinus disease. How is this treated? Treatment for a sinus infection depends on the cause and whether your condition is chronic or acute. If caused by a virus, your symptoms should go away on their own  within 10 days. You may be given medicines to relieve symptoms. They include: Medicines that shrink swollen nasal passages (decongestants). A spray that eases inflammation of the nostrils (topical intranasal corticosteroids). Rinses that help get rid of thick mucus in your nose (nasal saline washes). Medicines that treat allergies (antihistamines). Over-the-counter pain relievers. If caused by bacteria, your health care provider may recommend waiting to see if your symptoms improve. Most bacterial infections will get better without antibiotic medicine. You may be given antibiotics if you have: A severe infection. A weak immune system. If caused by narrow nasal passages or nasal polyps, surgery may be needed. Follow these instructions at home: Medicines Take, use, or apply over-the-counter and prescription medicines only as told by your health care provider. These may include nasal sprays. If you were prescribed an antibiotic medicine, take it as told by your health care provider. Do not stop taking the antibiotic even if you start to feel  better. Hydrate and humidify  Drink enough fluid to keep your urine pale yellow. Staying hydrated will help to thin your mucus. Use a cool mist humidifier to keep the humidity level in your home above 50%. Inhale steam for 10-15 minutes, 3-4 times a day, or as told by your health care provider. You can do this in the bathroom while a hot shower is running. Limit your exposure to cool or dry air. Rest Rest as much as possible. Sleep with your head raised (elevated). Make sure you get enough sleep each night. General instructions  Apply a warm, moist washcloth to your face 3-4 times a day or as told by your health care provider. This will help with discomfort. Use nasal saline washes as often as told by your health care provider. Wash your hands often with soap and water to reduce your exposure to germs. If soap and water are not available, use hand sanitizer. Do not smoke. Avoid being around people who are smoking (secondhand smoke). Keep all follow-up visits. This is important. Contact a health care provider if: You have a fever. Your symptoms get worse. Your symptoms do not improve within 10 days. Get help right away if: You have a severe headache. You have persistent vomiting. You have severe pain or swelling around your face or eyes. You have vision problems. You develop confusion. Your neck is stiff. You have trouble breathing. These symptoms may be an emergency. Get help right away. Call 911. Do not wait to see if the symptoms will go away. Do not drive yourself to the hospital. Summary A sinus infection is soreness and inflammation of your sinuses. Sinuses are hollow spaces in the bones around your face. This condition is caused by nasal tissues that become inflamed or swollen. The swelling traps or blocks the flow of mucus. This allows bacteria, viruses, and fungi to grow, which leads to infection. If you were prescribed an antibiotic medicine, take it as told by your  health care provider. Do not stop taking the antibiotic even if you start to feel better. Keep all follow-up visits. This is important. This information is not intended to replace advice given to you by your health care provider. Make sure you discuss any questions you have with your health care provider. Document Revised: 09/02/2021 Document Reviewed: 09/02/2021 Elsevier Patient Education  2024 Elsevier Inc.   If you have been instructed to have an in-person evaluation today at a local Urgent Care facility, please use the link below. It will take you to a list of all  of our available [facility], including address, phone number and hours of operation. Please do not delay care.  [facility]  If you or a family member do not have a primary care provider, use the link below to schedule a visit and establish care. When you choose a [facility] primary care physician or advanced practice provider, you gain a long-term partner in health. Find a Primary Care Provider  Learn more about [facility] in-office and virtual care options: [facility] - Get Care Now "

## 2024-10-03 NOTE — Progress Notes (Signed)
 " Virtual Visit Consent   Donna Mcdowell, you are scheduled for a virtual visit with a [facility] provider today. Just as with appointments in the office, your consent must be obtained to participate. Your consent will be active for this visit and any virtual visit you may have with one of our providers in the next 365 days. If you have a MyChart account, a copy of this consent can be sent to you electronically.  As this is a virtual visit, video technology does not allow for your provider to perform a traditional examination. This may limit your provider's ability to fully assess your condition. If your provider identifies any concerns that need to be evaluated in person or the need to arrange testing (such as labs, EKG, etc.), we will make arrangements to do so. Although advances in technology are sophisticated, we cannot ensure that it will always work on either your end or our end. If the connection with a video visit is poor, the visit may have to be switched to a telephone visit. With either a video or telephone visit, we are not always able to ensure that we have a secure connection.  By engaging in this virtual visit, you consent to the provision of healthcare and authorize for your insurance to be billed (if applicable) for the services provided during this visit. Depending on your insurance coverage, you may receive a charge related to this service.  I need to obtain your verbal consent now. Are you willing to proceed with your visit today? [name] has provided verbal consent on 10/03/2024 for a virtual visit (video or telephone). [name], PA-C  Date: 10/03/2024 4:28 PM   Virtual Visit via Video Note   I, [name], connected with  Donna Mcdowell  ([id number], 15-Sep-1981) on 10/03/2024 at  4:15 PM EST by a video-enabled telemedicine application and verified that I am speaking with the correct person using two identifiers.  Location: Patient: Virtual  Visit Location Patient: Mobile Provider: Virtual Visit Location Provider: Home Office   I discussed the limitations of evaluation and management by telemedicine and the availability of in person appointments. The patient expressed understanding and agreed to proceed.    History of Present Illness: Donna Mcdowell is a 43 y.o. who identifies as a female who was assigned female at birth, and is being seen today for sinus congestion and right ear pain.  HPI: Sinusitis This is a new problem. The current episode started 1 to 4 weeks ago (2-3 weeks with increased congested). The problem has been gradually worsening since onset. There has been no fever. The pain is moderate. Associated symptoms include congestion, ear pain (right), headaches, sinus pressure and swollen glands. Pertinent negatives include no coughing, hoarse voice or sore throat. Past treatments include acetaminophen  (flonase , allegra , tylenol , ibuprofen , saline nasal sprays). The treatment provided no relief.     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  Resting comfortably  Head is normocephalic, atraumatic.  No labored breathing.  Speech is  clear and coherent with logical content.  Patient is alert and oriented at baseline.  External helix of right ear is swollen, red, warm to touch per patient and tender to touch per patient Swollen and tender glands and lymph nodes on the right as well-per patient  Assessment and Plan: 1. Acute bacterial sinusitis (Primary) - amoxicillin -clavulanate (AUGMENTIN ) 875-125 MG tablet; Take 1 tablet by mouth 2 (two) times daily.  Dispense: 20 tablet; Refill: 0 - predniSONE  (DELTASONE ) 20 MG tablet; Take 2 tablets (40 mg total) by mouth daily with breakfast.  Dispense: 14 tablet; Refill: 0  2. Cellulitis of ear, right - amoxicillin -clavulanate (AUGMENTIN ) 875-125 MG tablet; Take 1 tablet by mouth 2 (two) times daily.  Dispense: 20 tablet; Refill: 0 - predniSONE  (DELTASONE ) 20 MG tablet; Take 2 tablets (40 mg total) by mouth daily with breakfast.  Dispense: 14 tablet; Refill: 0  - Worsening symptoms that have not responded to OTC medications.  - Will give Augmentin  and Prednisone  - Continue allergy medications.  - Steam and humidifier can help - Stay well hydrated and get plenty of rest.  - Seek in person evaluation if no symptom improvement or if symptoms worsen   Follow Up Instructions: I discussed the assessment and treatment plan with the patient. The patient was provided an opportunity to ask questions and all were answered. The patient agreed with the plan and demonstrated an understanding of the instructions.  A copy of instructions were sent to the patient via MyChart unless otherwise noted below.    The patient was advised to call back or seek an in-person evaluation if the symptoms worsen or if the condition fails to improve as anticipated.    [name], PA-C     [1]  Allergies Allergen Reactions   Oxycodone Nausea And Vomiting  [2]  Current Outpatient Medications:    amoxicillin -clavulanate (AUGMENTIN ) 875-125 MG tablet, Take 1 tablet by mouth 2 (two) times daily.,  Disp: 20 tablet, Rfl: 0   predniSONE  (DELTASONE ) 20 MG tablet, Take 2 tablets (40 mg total) by mouth daily with breakfast., Disp: 14 tablet, Rfl: 0   acetaminophen  (TYLENOL ) 500 MG tablet, Take 1,000 mg by mouth every 6 (six) hours as needed., Disp: , Rfl:    azelastine  (ASTELIN ) 0.1 % nasal spray, Place 1 spray into both nostrils 2 (two) times daily. Use in each nostril as directed, Disp: 30 mL, Rfl: 1   busPIRone  (BUSPAR ) 5 MG tablet, Take 1 tablet (5 mg total) by mouth 2 (two) times daily as needed., Disp: 30 tablet, Rfl: 1   Cholecalciferol (D3 PO), Take by mouth., Disp: , Rfl:    Cyanocobalamin  (B-12 PO), Take by mouth., Disp: , Rfl:    escitalopram  (LEXAPRO ) 10 MG tablet, Take 1 tablet (10 mg total) by mouth daily., Disp: 30 tablet, Rfl: 3   fexofenadine  (ALLEGRA ) 180 MG tablet, Take 180 mg by mouth daily., Disp: , Rfl:    fluticasone  (FLONASE ) 50 MCG/ACT nasal spray, Place 2 sprays into both nostrils daily., Disp: 48 g, Rfl: 3   ibuprofen  (ADVIL ) 600 MG tablet, Take 1 tablet (600 mg total) by mouth every 6 (six) hours as needed., Disp: 30 tablet, Rfl: 0   Multiple Vitamin (MULTIVITAMIN) tablet, Take 1 tablet by mouth  daily., Disp: , Rfl:    Testosterone 1.62 % GEL, Apply 2 pumps (1mg ) topically every day to the inner thigh or over the shoulders and cover with clothing. Wash hands thoroughly after application. (Patient not taking: Reported on 09/29/2024), Disp: , Rfl:   "

## 2024-10-04 ENCOUNTER — Ambulatory Visit

## 2024-10-10 ENCOUNTER — Encounter: Payer: Self-pay | Admitting: Neurology

## 2024-10-10 ENCOUNTER — Ambulatory Visit: Admitting: Neurology

## 2024-10-10 VITALS — BP 120/76 | HR 76 | Ht 64.0 in | Wt 140.6 lb

## 2024-10-10 DIAGNOSIS — R42 Dizziness and giddiness: Secondary | ICD-10-CM | POA: Diagnosis not present

## 2024-10-10 DIAGNOSIS — J329 Chronic sinusitis, unspecified: Secondary | ICD-10-CM | POA: Diagnosis not present

## 2024-10-10 NOTE — Patient Instructions (Addendum)
 We will do a carotid Doppler ultrasound and call you with the test results. Likely, they will call to schedule after insurance authorizes the test. Most likely, you will go to [facility] for the test, but you may be able to choose another location if preferred.  We will do a brain scan, called MRI and call you with the test results. We will have to schedule you for this on a separate date. This test requires authorization from your insurance, and we will take care of the insurance process. So long as you are tests are benign, we will keep you posted and we can follow-up in this clinic as needed.

## 2024-10-10 NOTE — Progress Notes (Signed)
 Subjective:    Patient ID: Donna Mcdowell is a 43 y.o. female.  HPI    True Mar, MD, PhD [facility] [address], [address] [address] [place], [place] [address]  I saw the patient, Donna Mcdowell, as a referral from the emergency room for evaluation of her dizziness.  The patient is unaccompanied today.  Donna Mcdowell is a 43 year old female with an underlying medical history of vertigo, history of recent sinusitis, and chronic rhinitis with bilateral eustachian tube dysfunction (followed by ENT), who reports ongoing issues with intermittent dizziness.  She feels off balance, she does not necessarily have a sensation of the room spinning.  She is on a 10-day course of amoxicillin  currently for a sinus infection.  She reports that she presented to the emergency room because of an episode of severe dizziness while seated.  She was watching TV and when leaning forward she had a severe bout of dizziness.  Sometimes when she has a spell of dizziness she feels like she needs to squat down to avoid falling.  Thankfully, she has never fallen.  She denies any strokelike symptoms.  She is without symptoms today.  She has a follow-up with her ENT next month.  She denies any hearing loss or major visual symptoms although she is getting new transition eyeglasses and has a history of astigmatism.  She tries to hydrate well, estimates that she drinks about 3-4 large bottles of water per day, 24 ounce size.  She did not have much in the way of symptoms with orthostatic testing today.  She had recent blood work through PCP which I reviewed in her electronic chart.  Her vitamin B12 level was 510 on 08/15/2024, ferritin level 27.2, vitamin D  level borderline at 30.6.  She takes over-the-counter vitamin D , 2000 units daily.  She presented to the emergency room at [facility] on 09/15/2024 with a chief complaint of neck pain and dizziness as well as headache.  I reviewed the emergency  room records.  She had a head CT without contrast on 09/15/2024 and I reviewed the results:  IMPRESSION: 1. No acute intracranial process.     In addition, I personally and independently reviewed images through the PACS system.  Of note, she is status post nasal surgery with septoplasty and turbinoplasty as well as excision of nasal mass in 2019 as well as 2022 respectively.  I had evaluated her for vertigo symptoms several years ago at the request of her ENT.  She had imaging studies at the time including a brain MRI with and without contrast and an MR angiogram of the head without contrast on 06/06/2019 with benign results.  She had a baseline sleep study through our sleep lab on 05/31/2019 which showed no significant sleep disordered breathing with mild to moderate snoring detected.    Previously:  05/02/2019: 43 year old right-handed woman with an underlying benign medical history, status post septoplasty and inferior turbinate reduction in November 2019, who reports a new onset pulsatile tinnitus affecting her right ear for the past 2 or 3 months.  She has noted a thumping sound in her right ear, it is intermittent.  She has had intermittent dizziness for the past year, most severe in August 2019 when she drove to [facility] to attend a bachelor party.  She had severe spinning sensation at the time, felt bad the next day as well.  She denies any history of migraines or recurrent severe headaches.  She has occasional headaches especially if she  is dehydrated and not well rested.  She does report snoring and prior to her nasal surgery she had more louder snoring and also the occasional breathing irregularity according to her wife.  She lives with her wife, they have no kids, she has 2 dogs and 1 cat in the household, works as a public relations account executive, she admits that she has a erratic sleep schedule.  Epworth sleepiness score is 10 out of 24, fatigue severity score is 31 out of 63.  She had testing  for vertigo last year.  She drinks water regularly, tries to be good with her water intake, likes to drink 2 cups of coffee per day, sometimes more, no soda or tea, smokes 1 pack/day and drinks alcohol in the form of beer, an average of 2/day and quit drinking liquor about 4 years ago.  She has had intermittent increase in anxiety.  She denies any migrainous headaches, no one-sided severe throbbing headaches, no associated nausea or vomiting, no photophobia or sonophobia.  She has not fallen. I reviewed your office note from 02/20/2019 she had hearing test done through your office which showed normal hearing bilaterally and upon your examination, her ear examination and hearing were normal.  She had a brain MRI without contrast on 06/05/2018 due to a 4-day history of dizziness reported which prompted her to go to the emergency room at [facility] and I reviewed the results:   IMPRESSION: Normal MRI the brain. No acute or focal lesion to explain the patient's symptoms.  Her Past Medical History Is Significant For: Past Medical History:  Diagnosis Date   Carbuncle of face    forehead   Deviated septum 07/2018   Family history of breast cancer 12/26/2021   Nasal mass 07/2018   Nasal turbinate hypertrophy 07/2018   bilateral    Her Past Surgical History Is Significant For: Past Surgical History:  Procedure Laterality Date   EXCISION NASAL MASS Left 08/30/2018   Procedure: EXCISION OF NASAL MASS;  Surgeon: Karis Clunes, MD;  Location: [facility];  Service: ENT;  Laterality: Left;   EXCISION NASAL MASS Left 03/18/2021   Procedure: EXCISION LEFT NASAL INVERTED PAPILLOMAS;  Surgeon: Karis Clunes, MD;  Location: [facility];  Service: ENT;  Laterality: Left;   FINGER TENDON REPAIR Right 06/23/2010   long finger   INCISION AND DRAINAGE BREAST ABSCESS Left 06/09/2011   NASAL SEPTOPLASTY W/ TURBINOPLASTY Bilateral 08/30/2018   Procedure: NASAL SEPTOPLASTY WITH TURBINATE  REDUCTION;  Surgeon: Karis Clunes, MD;  Location: [facility];  Service: ENT;  Laterality: Bilateral;   TRIGGER FINGER RELEASE Right 06/23/2010   long finger   WISDOM TOOTH EXTRACTION      Her Family History Is Significant For: Family History  Problem Relation Age of Onset   Breast cancer Mother 29   Colon polyps Mother        unknown number   Osteoarthritis Maternal Grandmother    Lung cancer Maternal Grandmother        dx 60s   Sleep apnea Maternal Grandfather    Osteoarthritis Maternal Grandfather    Heart failure Maternal Grandfather    Migraines Neg Hx    Seizures Neg Hx    Stroke Neg Hx     Her Social History Is Significant For: Social History   Socioeconomic History   Marital status: Married    Spouse name: Not on file   Number of children: Not on file   Years of  education: Not on file   Highest education level: Not on file  Occupational History   Not on file  Tobacco Use   Smoking status: Former    Current packs/day: 1.00    Average packs/day: 1 pack/day for 19.0 years (19.0 ttl pk-yrs)    Types: Cigarettes   Smokeless tobacco: Never  Vaping Use   Vaping status: Every Day   Substances: Nicotine  Substance and Sexual Activity   Alcohol use: Yes    Comment: Drinks 2 drinks a day   Drug use: No   Sexual activity: Yes    Partners: Female    Comment: female partner  Other Topics Concern   Not on file  Social History Narrative   1-2  cup coffee daily    Social Drivers of Health   Tobacco Use: Medium Risk (10/03/2024)   Patient History    Smoking Tobacco Use: Former    Smokeless Tobacco Use: Never    Passive Exposure: Not on Actuary Strain: Not on file  Food Insecurity: Not on file  Transportation Needs: Not on file  Physical Activity: Not on file  Stress: Not on file  Social Connections: Not on file  Depression (PHQ2-9): Low Risk (09/29/2024)   Depression (PHQ2-9)    PHQ-2 Score: 2  Alcohol Screen: Not on file   Housing: Unknown (08/25/2024)   Received from [facility]   Epic    Unable to Pay for Housing in the Last Year: Not on file    Number of Times Moved in the Last Year: Not on file    At any time in the past 12 months, were you homeless or living in a shelter (including now)?: No  Utilities: Not on file  Health Literacy: Not on file    Her Allergies Are:  Allergies[1]:   Her Current Medications Are:  Outpatient Encounter Medications as of 10/10/2024  Medication Sig   acetaminophen  (TYLENOL ) 500 MG tablet Take 1,000 mg by mouth every 6 (six) hours as needed.   amoxicillin -clavulanate (AUGMENTIN ) 875-125 MG tablet Take 1 tablet by mouth 2 (two) times daily.   azelastine  (ASTELIN ) 0.1 % nasal spray Place 1 spray into both nostrils 2 (two) times daily. Use in each nostril as directed   busPIRone  (BUSPAR ) 5 MG tablet Take 1 tablet (5 mg total) by mouth 2 (two) times daily as needed.   Cholecalciferol (D3 PO) Take by mouth.   escitalopram  (LEXAPRO ) 10 MG tablet Take 1 tablet (10 mg total) by mouth daily.   fexofenadine  (ALLEGRA ) 180 MG tablet Take 180 mg by mouth daily.   fluticasone  (FLONASE ) 50 MCG/ACT nasal spray Place 2 sprays into both nostrils daily.   ibuprofen  (ADVIL ) 600 MG tablet Take 1 tablet (600 mg total) by mouth every 6 (six) hours as needed.   Multiple Vitamin (MULTIVITAMIN) tablet Take 1 tablet by mouth daily.   predniSONE  (DELTASONE ) 20 MG tablet Take 2 tablets (40 mg total) by mouth daily with breakfast.   Cyanocobalamin  (B-12 PO) Take by mouth. (Patient not taking: Reported on 10/10/2024)   Testosterone 1.62 % GEL Apply 2 pumps (1mg ) topically every day to the inner thigh or over the shoulders and cover with clothing. Wash hands thoroughly after application. (Patient not taking: Reported on 10/10/2024)   No facility-administered encounter medications on file as of 10/10/2024.  :   Review of Systems:  Out of a complete 14 point review of systems, all  are reviewed and negative with the exception of  these symptoms as listed below:  Review of Systems  Objective:  Neurological Exam  Physical Exam Physical Examination:   On orthostatic testing she has a mild drop in the systolic and diastolic blood pressure numbers but no significant symptoms and no vertiginous symptoms. Vitals:   10/10/24 0838  BP: 120/76  Pulse: 76  SpO2: 98%    General Examination: The patient is a very pleasant 43 y.o. female in no acute distress. She appears well-developed and well-nourished and well groomed.   HEENT: Normocephalic, atraumatic, pupils are equal, round and reactive to light and accommodation. Funduscopic exam is normal with sharp disc margins noted.  No photophobia.  She is wearing a face mask which she briefly removed for airway examination.  Extraocular tracking is good without limitation to gaze excursion or nystagmus noted. Normal smooth pursuit is noted. Hearing is grossly intact. Tympanic membranes are clear bilaterally. Face is symmetric with normal facial animation and normal facial sensation. Speech is clear with no dysarthria noted. There is no hypophonia. There is no lip, neck/head, jaw or voice tremor. Neck is supple with full range of passive and active motion. There are no carotid bruits on auscultation. Oropharynx exam reveals: mild mouth dryness, good dental hygiene and mild airway crowding, due to small airway entry. Tongue protrudes centrally in palate elevates symmetrically.  Chest: Clear to auscultation without wheezing, rhonchi or crackles noted.   Heart: S1+S2+0, regular and normal without murmurs, rubs or gallops noted.    Abdomen: Soft, non-tender and non-distended with normal bowel sounds appreciated on auscultation.   Extremities: There is no pitting edema in the distal lower extremities bilaterally.    Skin: Warm and dry without trophic changes noted.   Musculoskeletal: exam reveals no obvious joint deformities.     Neurologically:  Mental status: The patient is awake, alert and oriented in all 4 spheres. Her immediate and remote memory, attention, language skills and fund of knowledge are appropriate. There is no evidence of aphasia, agnosia, apraxia or anomia. Speech is clear with normal prosody and enunciation. Thought process is linear. Mood is normal and affect is normal.  Cranial nerves II - XII are as described above under HEENT exam. In addition: shoulder shrug is normal with equal shoulder height noted. Motor exam: Normal bulk, strength and tone is noted. There is no drift, postural or action or resting tremor or rebound. Romberg is negative. Reflexes are 2+ throughout. Babinski: Toes are flexor bilaterally. Fine motor skills and coordination: intact with normal finger taps, normal hand movements, normal rapid alternating patting, normal foot taps and normal foot agility.  Cerebellar testing: No dysmetria or intention tremor on finger to nose testing. Heel to shin is unremarkable bilaterally. There is no truncal or gait ataxia.   Sensory exam: intact to light touch, vibration, and temperature sense in the upper and lower extremities.  Gait, station and balance: She stands easily. No veering to one side is noted. No leaning to one side is noted. Posture is age-appropriate and stance is narrow based. Gait shows normal stride length and normal pace. No problems turning are noted. Tandem walk is unremarkable.    Assessment and Plan:  In summary, Donna Mcdowell is a 43 year old female with an underlying medical history of vertigo, history of recent sinusitis, and chronic rhinitis with bilateral eustachian tube dysfunction (followed by ENT), who presents for evaluation of her recurrent dizziness of several years duration with intermittent spells described.  History and examination are in keeping with recurrent positional vertigo, versus  nonspecific dizziness, with possible medication side effect not excluded,  low normal blood pressure values with mild drop noted.  The patient is largely reassured today, neurological exam is nonfocal and recent head CT was benign, prior brain MRI and MR angiogram of the head were nonrevealing in 2020.  I would be happy to recheck with a brain MRI.  I also recommend a carotid Doppler ultrasound as she added that sometimes her symptoms are triggered by over is extending her neck especially noted when she went to the hairdresser and had her head in the bowl for hair washing.   She is reminded to stay well-hydrated and finish her antibiotic treatment for sinusitis which could also be a contributor at this moment.  She also has chronic issues with sinus congestion and eustachian tube dysfunction.  She is advised that I do not see a primary neurological cause of her symptoms.  Sometimes low vitamin D  can cause nonspecific dizziness and anemia.  She is low normal on her vitamin D  but is on supplementation.  At this juncture, if her brain MRI and carotid Doppler ultrasound showed benign findings, she is advised to follow-up in this clinic as needed.  I answered all her questions today and she was in agreement.   This was an extended visit of over 60 minutes with copious electronic record review involved, lab review, personal image test review and considerable counseling and coordination of care.      [1]  Allergies Allergen Reactions   Oxycodone Nausea And Vomiting and Other (See Comments)

## 2024-10-13 ENCOUNTER — Ambulatory Visit (HOSPITAL_COMMUNITY)
Admission: RE | Admit: 2024-10-13 | Discharge: 2024-10-13 | Disposition: A | Discharge status: Home / Self Care | Source: Ambulatory Visit | Attending: Neurology | Admitting: Neurology

## 2024-10-13 DIAGNOSIS — R42 Dizziness and giddiness: Secondary | ICD-10-CM | POA: Diagnosis present

## 2024-10-13 DIAGNOSIS — J329 Chronic sinusitis, unspecified: Secondary | ICD-10-CM | POA: Insufficient documentation

## 2024-10-16 ENCOUNTER — Ambulatory Visit: Payer: Self-pay | Admitting: Neurology

## 2024-10-23 ENCOUNTER — Encounter: Payer: Self-pay | Admitting: Gastroenterology

## 2024-10-23 ENCOUNTER — Telehealth: Payer: Self-pay

## 2024-10-23 MED ORDER — VILAZODONE HCL 20 MG PO TABS: 20.0000 mg | ORAL_TABLET | Freq: Every day | ORAL | 1 refills | Status: AC

## 2024-10-23 NOTE — Telephone Encounter (Signed)
 We have sent in vilazodone  to start with 1/2 pill daily (10 mg) for 1 week. Then increase to 1 pill daily (20 mg). This is not a generic medicine so if too costly let us  know.

## 2024-10-23 NOTE — Telephone Encounter (Signed)
 Copied from CRM #[id number]. Topic: Clinical - Medication Question >> Oct 23, 2024 10:51 AM Deleta RAMAN wrote: Reason for CRM: patient would like to know if she can change from escitalopram  (LEXAPRO ) 10 MG tablet to vilazodone  due the side effect of weight gain that Lexapro  comes with and both are similar. [id number]

## 2024-10-23 NOTE — Telephone Encounter (Signed)
 Called pt and informed of rx sent, advised to let us  know if too costly. Pt verbalized understanding

## 2024-10-23 NOTE — Addendum Note (Signed)
 Addended by: ROLLENE NORRIS A on: 10/23/2024 02:54 PM   Modules accepted: Orders

## 2024-10-26 ENCOUNTER — Encounter: Payer: Self-pay | Admitting: Neurology

## 2024-10-31 ENCOUNTER — Ambulatory Visit (INDEPENDENT_AMBULATORY_CARE_PROVIDER_SITE_OTHER): Admitting: Otolaryngology

## 2024-10-31 ENCOUNTER — Encounter (INDEPENDENT_AMBULATORY_CARE_PROVIDER_SITE_OTHER): Payer: Self-pay | Admitting: Otolaryngology

## 2024-10-31 VITALS — BP 115/74 | HR 84 | Ht 64.0 in | Wt 135.0 lb

## 2024-10-31 DIAGNOSIS — H6983 Other specified disorders of Eustachian tube, bilateral: Secondary | ICD-10-CM | POA: Diagnosis not present

## 2024-10-31 DIAGNOSIS — J310 Chronic rhinitis: Secondary | ICD-10-CM

## 2024-11-01 NOTE — Progress Notes (Signed)
 Patient ID: Donna Mcdowell, female   DOB: [birth year], 44 y.o.   MRN: [id number]  Follow up: Left nasal inverted papilloma, chronic nasal congestion, eustachian tube dysfunction   HPI: The patient is a 43 year old female who returns today for her follow-up evaluation.  The patient was previously seen for chronic nasal congestion, a left nasal inverting papilloma, and eustachian tube dysfunction.  She underwent extensive resection of her nasal papilloma in 2022.  The patient returns today reporting no recurrent nasal mass.  However, she has noted recurrent sinusitis and dryness in her nose.  She has also noted frequent clogging sensation in her right nose.  The patient uses Allegra , Flonase , and nasal saline spray.  She denies any fever or visual change.  Exam: General: Communicates without difficulty, well nourished, no acute distress. Head: Normocephalic, no evidence injury, no tenderness, facial buttresses intact without stepoff. Face/sinus: No tenderness to palpation and percussion. Facial movement is normal and symmetric. Eyes: PERRL, EOMI. No scleral icterus, conjunctivae clear. Neuro: CN II exam reveals vision grossly intact.  No nystagmus at any point of gaze. Ears: Auricles well formed without lesions.  Ear canals are intact without mass or lesion.  No erythema or edema is appreciated.  The TMs are intact without fluid. Nose: External evaluation reveals normal support and skin without lesions.  Dorsum is intact.  Anterior rhinoscopy reveals dry but congested mucosa over anterior aspect of inferior turbinates and intact septum.  No recurrent papilloma noted.  Oral:  Oral cavity and oropharynx are intact, symmetric, without erythema or edema.  Mucosa is moist without lesions. Neck: Full range of motion without pain.  There is no significant lymphadenopathy.  No masses palpable.  Thyroid  bed within normal limits to palpation.  Parotid glands and submandibular glands equal bilaterally without mass.   Trachea is midline. Neuro:  CN 2-12 grossly intact.     Assessment: 1.  No recurrent papilloma is noted in the left nasal cavity. 2.  Chronic/allergic rhinitis, with dry patch congested nasal mucosa. 3.  No acute infection is noted today. 4.  Clinical eustachian tube dysfunction.  Plan: 1.  The physical exam findings are reviewed with the patient. 2.  Xylitol nasal saline irrigation daily. 3.  Valsalva exercise multiple times a day. 4.  Flonase  and Allegra  as needed to treat her allergy symptoms. 5.  The patient will return for reevaluation in 1 year, sooner if needed.

## 2024-11-10 ENCOUNTER — Ambulatory Visit
Admission: RE | Admit: 2024-11-10 | Discharge: 2024-11-10 | Disposition: A | Source: Ambulatory Visit | Attending: Neurology | Admitting: Neurology

## 2024-11-10 ENCOUNTER — Telehealth: Admitting: Family

## 2024-11-10 ENCOUNTER — Ambulatory Visit

## 2024-11-10 DIAGNOSIS — R42 Dizziness and giddiness: Secondary | ICD-10-CM

## 2024-11-10 DIAGNOSIS — J329 Chronic sinusitis, unspecified: Secondary | ICD-10-CM

## 2024-11-10 DIAGNOSIS — Z20818 Contact with and (suspected) exposure to other bacterial communicable diseases: Secondary | ICD-10-CM

## 2024-11-10 DIAGNOSIS — J029 Acute pharyngitis, unspecified: Secondary | ICD-10-CM

## 2024-11-10 MED ORDER — GADOPICLENOL 0.5 MMOL/ML IV SOLN
6.0000 mL | Freq: Once | INTRAVENOUS | Status: AC | PRN
  Administered 2024-11-10: 6 mL via INTRAVENOUS

## 2024-11-10 MED ORDER — AMOXICILLIN 500 MG PO CAPS: 500.0000 mg | ORAL_CAPSULE | Freq: Two times a day (BID) | ORAL | 0 refills | Status: AC

## 2024-11-11 ENCOUNTER — Ambulatory Visit

## 2024-11-15 ENCOUNTER — Ambulatory Visit: Admitting: Gastroenterology

## 2024-11-15 ENCOUNTER — Encounter: Payer: Self-pay | Admitting: Gastroenterology

## 2024-11-15 VITALS — BP 104/60 | HR 78 | Ht 64.0 in | Wt 137.4 lb

## 2024-11-15 DIAGNOSIS — Z83719 Family history of colon polyps, unspecified: Secondary | ICD-10-CM

## 2024-11-15 DIAGNOSIS — Z09 Encounter for follow-up examination after completed treatment for conditions other than malignant neoplasm: Secondary | ICD-10-CM

## 2024-11-15 MED ORDER — NA SULFATE-K SULFATE-MG SULF 17.5-3.13-1.6 GM/177ML PO SOLN: 1.0000 | ORAL | 0 refills | Status: AC

## 2024-11-15 NOTE — Patient Instructions (Signed)
 You have been scheduled for a colonoscopy. Please follow written instructions given to you at your visit today.   If you use inhalers (even only as needed), please bring them with you on the day of your procedure.  DO NOT TAKE 7 DAYS PRIOR TO TEST- Trulicity (dulaglutide) Ozempic, Wegovy (semaglutide) Mounjaro, Zepbound (tirzepatide) Bydureon Bcise (exanatide extended release)  DO NOT TAKE 1 DAY PRIOR TO YOUR TEST Rybelsus (semaglutide) Adlyxin (lixisenatide) Victoza (liraglutide) Byetta (exanatide) ___________________________________________________________________________  I appreciate the opportunity to care for you. Nestor Blower, PA

## 2024-11-22 ENCOUNTER — Ambulatory Visit: Admitting: Internal Medicine

## 2024-12-29 ENCOUNTER — Encounter: Admitting: Internal Medicine

## 2025-02-28 ENCOUNTER — Ambulatory Visit: Admitting: Dermatology

## 2025-05-14 ENCOUNTER — Ambulatory Visit: Admitting: Obstetrics and Gynecology

## 2025-07-25 ENCOUNTER — Encounter: Admitting: Internal Medicine
